# Patient Record
Sex: Female | Born: 1940 | Race: White | Hispanic: No | Smoking: Never smoker
Health system: Southern US, Community
[De-identification: ages and names within clinical notes are randomized; demographics above are authoritative.]

## PROBLEM LIST (undated history)

## (undated) DIAGNOSIS — C439 Malignant melanoma of skin, unspecified: Secondary | ICD-10-CM

## (undated) DIAGNOSIS — R519 Headache, unspecified: Secondary | ICD-10-CM

## (undated) DIAGNOSIS — D494 Neoplasm of unspecified behavior of bladder: Secondary | ICD-10-CM

## (undated) DIAGNOSIS — F419 Anxiety disorder, unspecified: Secondary | ICD-10-CM

## (undated) DIAGNOSIS — K219 Gastro-esophageal reflux disease without esophagitis: Secondary | ICD-10-CM

## (undated) DIAGNOSIS — M1990 Unspecified osteoarthritis, unspecified site: Secondary | ICD-10-CM

## (undated) DIAGNOSIS — C50919 Malignant neoplasm of unspecified site of unspecified female breast: Secondary | ICD-10-CM

## (undated) DIAGNOSIS — E785 Hyperlipidemia, unspecified: Secondary | ICD-10-CM

## (undated) DIAGNOSIS — R7303 Prediabetes: Secondary | ICD-10-CM

## (undated) DIAGNOSIS — R112 Nausea with vomiting, unspecified: Secondary | ICD-10-CM

## (undated) DIAGNOSIS — D649 Anemia, unspecified: Secondary | ICD-10-CM

## (undated) DIAGNOSIS — Z9889 Other specified postprocedural states: Secondary | ICD-10-CM

## (undated) DIAGNOSIS — I10 Essential (primary) hypertension: Secondary | ICD-10-CM

## (undated) HISTORY — DX: Hyperlipidemia, unspecified: E78.5

## (undated) HISTORY — PX: VAGINAL HYSTERECTOMY: SUR661

## (undated) HISTORY — PX: APPENDECTOMY: SHX54

## (undated) HISTORY — PX: CHOLECYSTECTOMY: SHX55

## (undated) HISTORY — DX: Essential (primary) hypertension: I10

## (undated) HISTORY — DX: Gastro-esophageal reflux disease without esophagitis: K21.9

---

## 1989-01-12 DIAGNOSIS — I219 Acute myocardial infarction, unspecified: Secondary | ICD-10-CM

## 1989-01-12 HISTORY — DX: Acute myocardial infarction, unspecified: I21.9

## 2004-08-04 ENCOUNTER — Ambulatory Visit: Payer: Self-pay | Admitting: Family Medicine

## 2004-08-13 ENCOUNTER — Ambulatory Visit: Payer: Self-pay | Admitting: General Surgery

## 2004-08-19 ENCOUNTER — Ambulatory Visit: Payer: Self-pay | Admitting: General Surgery

## 2005-08-06 ENCOUNTER — Ambulatory Visit: Payer: Self-pay | Admitting: Family Medicine

## 2005-10-09 ENCOUNTER — Ambulatory Visit: Payer: Self-pay | Admitting: Gastroenterology

## 2006-08-12 ENCOUNTER — Ambulatory Visit: Payer: Self-pay | Admitting: Family Medicine

## 2007-09-26 ENCOUNTER — Ambulatory Visit: Payer: Self-pay | Admitting: Family Medicine

## 2008-09-27 ENCOUNTER — Ambulatory Visit: Payer: Self-pay | Admitting: Family Medicine

## 2009-03-02 ENCOUNTER — Ambulatory Visit: Payer: Self-pay | Admitting: Internal Medicine

## 2009-10-28 ENCOUNTER — Ambulatory Visit: Payer: Self-pay | Admitting: Family Medicine

## 2010-10-30 ENCOUNTER — Ambulatory Visit: Payer: Self-pay | Admitting: Family Medicine

## 2011-11-03 ENCOUNTER — Ambulatory Visit: Payer: Self-pay | Admitting: Family Medicine

## 2011-11-10 ENCOUNTER — Ambulatory Visit: Payer: Self-pay | Admitting: Family Medicine

## 2012-01-13 HISTORY — PX: COLONOSCOPY: SHX174

## 2012-02-19 ENCOUNTER — Ambulatory Visit: Payer: Self-pay | Admitting: Gastroenterology

## 2012-06-02 ENCOUNTER — Ambulatory Visit: Payer: Self-pay | Admitting: Surgery

## 2012-07-12 ENCOUNTER — Ambulatory Visit: Payer: Self-pay | Admitting: Surgery

## 2012-07-12 DIAGNOSIS — I2510 Atherosclerotic heart disease of native coronary artery without angina pectoris: Secondary | ICD-10-CM

## 2012-07-12 LAB — BASIC METABOLIC PANEL
Anion Gap: 6 — ABNORMAL LOW (ref 7–16)
BUN: 14 mg/dL (ref 7–18)
Calcium, Total: 9.6 mg/dL (ref 8.5–10.1)
Chloride: 106 mmol/L (ref 98–107)
Co2: 28 mmol/L (ref 21–32)
EGFR (African American): >60
EGFR (Non-African Amer.): >60
Glucose: 101 mg/dL — ABNORMAL HIGH (ref 65–99)
Osmolality: 280 (ref 275–301)
Potassium: 4.6 mmol/L (ref 3.5–5.1)
Sodium: 140 mmol/L (ref 136–145)

## 2012-07-12 LAB — CBC WITH DIFFERENTIAL/PLATELET
Basophil #: 0 10*3/uL (ref 0.0–0.1)
Basophil %: 0.6 %
Eosinophil #: 0.1 10*3/uL (ref 0.0–0.7)
Eosinophil %: 1.9 %
HCT: 38.9 % (ref 35.0–47.0)
HGB: 13.8 g/dL (ref 12.0–16.0)
Lymphocyte #: 1.7 10*3/uL (ref 1.0–3.6)
MCHC: 35.5 g/dL (ref 32.0–36.0)
MCV: 85 fL (ref 80–100)
Monocyte %: 7.7 %
Neutrophil #: 5 10*3/uL (ref 1.4–6.5)
Platelet: 164 10*3/uL (ref 150–440)
RDW: 14.3 % (ref 11.5–14.5)
WBC: 7.5 10*3/uL (ref 3.6–11.0)

## 2012-07-19 ENCOUNTER — Ambulatory Visit: Payer: Self-pay | Admitting: Surgery

## 2012-08-12 ENCOUNTER — Ambulatory Visit: Payer: Self-pay | Admitting: Oncology

## 2012-08-12 DIAGNOSIS — C50919 Malignant neoplasm of unspecified site of unspecified female breast: Secondary | ICD-10-CM

## 2012-08-12 HISTORY — PX: MASTECTOMY: SHX3

## 2012-08-12 HISTORY — DX: Malignant neoplasm of unspecified site of unspecified female breast: C50.919

## 2012-08-15 ENCOUNTER — Ambulatory Visit: Payer: Self-pay | Admitting: Surgery

## 2012-08-15 LAB — CBC WITH DIFFERENTIAL/PLATELET
Basophil #: 0 10*3/uL (ref 0.0–0.1)
Basophil %: 0.5 %
HCT: 36.8 % (ref 35.0–47.0)
Lymphocyte #: 1.6 10*3/uL (ref 1.0–3.6)
MCV: 85 fL (ref 80–100)
Monocyte #: 0.5 x10 3/mm (ref 0.2–0.9)
Neutrophil #: 4.8 10*3/uL (ref 1.4–6.5)
Platelet: 164 10*3/uL (ref 150–440)
RBC: 4.35 10*6/uL (ref 3.80–5.20)
RDW: 14.1 % (ref 11.5–14.5)
WBC: 7.2 10*3/uL (ref 3.6–11.0)

## 2012-08-15 LAB — COMPREHENSIVE METABOLIC PANEL
Albumin: 3.6 g/dL (ref 3.4–5.0)
Alkaline Phosphatase: 85 U/L (ref 50–136)
BUN: 12 mg/dL (ref 7–18)
Bilirubin,Total: 0.7 mg/dL (ref 0.2–1.0)
Calcium, Total: 9.2 mg/dL (ref 8.5–10.1)
Chloride: 107 mmol/L (ref 98–107)
Co2: 28 mmol/L (ref 21–32)
Creatinine: 0.61 mg/dL (ref 0.60–1.30)
EGFR (Non-African Amer.): >60
Glucose: 105 mg/dL — ABNORMAL HIGH (ref 65–99)
Osmolality: 278 (ref 275–301)
SGOT(AST): 23 U/L (ref 15–37)
Total Protein: 7.3 g/dL (ref 6.4–8.2)

## 2012-08-15 LAB — APTT: Activated PTT: 36 secs — ABNORMAL HIGH (ref 23.6–35.9)

## 2012-08-15 LAB — PROTIME-INR
INR: 1
Prothrombin Time: 12.9 secs (ref 11.5–14.7)

## 2012-08-22 ENCOUNTER — Ambulatory Visit: Payer: Self-pay | Admitting: Surgery

## 2012-08-24 LAB — PATHOLOGY REPORT

## 2012-08-26 ENCOUNTER — Ambulatory Visit: Payer: Self-pay | Admitting: Family Medicine

## 2012-09-12 ENCOUNTER — Ambulatory Visit: Payer: Self-pay | Admitting: Oncology

## 2013-04-13 ENCOUNTER — Ambulatory Visit: Payer: Self-pay | Admitting: Oncology

## 2013-04-14 LAB — CBC CANCER CENTER
Basophil #: 0.1 x10 3/mm (ref 0.0–0.1)
Basophil %: 0.8 %
Eosinophil #: 0.1 x10 3/mm (ref 0.0–0.7)
Eosinophil %: 2.2 %
HCT: 39.8 % (ref 35.0–47.0)
HGB: 13.5 g/dL (ref 12.0–16.0)
Lymphocyte #: 1.8 x10 3/mm (ref 1.0–3.6)
Lymphocyte %: 26.2 %
MCH: 29.2 pg (ref 26.0–34.0)
MCHC: 33.8 g/dL (ref 32.0–36.0)
MCV: 86 fL (ref 80–100)
MONO ABS: 0.6 x10 3/mm (ref 0.2–0.9)
MONOS PCT: 9.2 %
NEUTROS ABS: 4.1 x10 3/mm (ref 1.4–6.5)
Neutrophil %: 61.6 %
Platelet: 157 x10 3/mm (ref 150–440)
RBC: 4.62 10*6/uL (ref 3.80–5.20)
RDW: 13.8 % (ref 11.5–14.5)
WBC: 6.7 x10 3/mm (ref 3.6–11.0)

## 2013-04-14 LAB — COMPREHENSIVE METABOLIC PANEL
ALBUMIN: 3.7 g/dL (ref 3.4–5.0)
Alkaline Phosphatase: 87 U/L
Anion Gap: 6 — ABNORMAL LOW (ref 7–16)
BUN: 14 mg/dL (ref 7–18)
Bilirubin,Total: 0.7 mg/dL (ref 0.2–1.0)
CO2: 30 mmol/L (ref 21–32)
Calcium, Total: 9 mg/dL (ref 8.5–10.1)
Chloride: 104 mmol/L (ref 98–107)
Creatinine: 0.8 mg/dL (ref 0.60–1.30)
EGFR (African American): >60
EGFR (Non-African Amer.): >60
Glucose: 92 mg/dL (ref 65–99)
Osmolality: 280 (ref 275–301)
POTASSIUM: 3.8 mmol/L (ref 3.5–5.1)
SGOT(AST): 21 U/L (ref 15–37)
SGPT (ALT): 20 U/L (ref 12–78)
Sodium: 140 mmol/L (ref 136–145)
Total Protein: 7.5 g/dL (ref 6.4–8.2)

## 2013-05-12 ENCOUNTER — Ambulatory Visit: Payer: Self-pay | Admitting: Oncology

## 2013-06-16 ENCOUNTER — Ambulatory Visit: Payer: Self-pay | Admitting: Oncology

## 2014-05-04 NOTE — Op Note (Signed)
PATIENT NAME:  Amber Galloway, Amber Galloway MR#:  [id number] DATE OF BIRTH:  01-Mar-1940  DATE OF PROCEDURE:  08/22/2012  PREOPERATIVE DIAGNOSIS: Right breast cancer.   POSTOPERATIVE DIAGNOSIS: Right breast cancer.   SURGEON: Consuela Mimes, M.D.   ANESTHESIA: General.   OPERATION PERFORMED: Right modified radical mastectomy.   PROCEDURE IN DETAIL: The patient was placed supine on the operating room table and prepped and draped in the usual sterile fashion. An elliptical incision was made and oriented transversely to include the entire nipple areolar complex with 1 apex of the ellipse near the lateral sternal border and the other apex at the lateral aspect of the breast. This incision was carried down through the skin subcutaneous tissue and subcutaneous skin flaps were created in all 4 directions. The superior border was the clavicle, the medial border was the lateral edge of the sternum, the inferior border was the inframammary fold and the lateral border was the free edge of the latissimus dorsi muscle. The breast was removed from the pectoralis major muscle to include the pectoralis major muscle fascia and perforating vessels from the internal mammary artery where it ligated with 2-0 silk suture ligatures and the Harmonic scalpel. As the breast tissue was completely removed and left attached to the patient by the axillary fat pad, care was taken to include all of the interpectoral fat and the breast tissue overlying the serratus anterior muscle and then the dissection was continued superiorly. Although the free edge of the latissimus dorsi muscle was not exposed, all of the axillary lymph node packet was excised in continuity with the breast up to the edge of the axillary vein. The axillary vein and lymphatic tissue was not disturbed and then the dissection was continued in a medial direction such that a 3-0 silk ligature was placed on a branch from the axillary vein and artery, but the thoracodorsal nerve  artery and vein were not disturbed.   Similarly, the long thoracic nerve was not disturbed. Similarly, the medial pectoral nerve was identified and was not disturbed. About two thirds of the level 1 lymph nodes (which were not clinically enlarged) were removed with the breast tissue and a suture mark was placed on the highest level 1 lymph node that had been excised. Hemostasis was deemed excellent and the wound was irrigated with warm normal saline and 2 large flat Jackson-Pratt drains were placed beneath the skin flaps and interrupted 3-0 Monocryl subdermal closure was performed and the skin was reapproximated with a running subcuticular 4-0 Monocryl and suture strips. A compressive dressing was applied and the Jackson-Pratt drains were secured to the skin with 2-0 nylon sutures. The patient tolerated the procedure well. There were no complications.   ____________________________ Consuela Mimes, MD wfm:aw D: 08/22/2012 10:07:59 ET T: 08/22/2012 11:00:58 ET JOB#: [id number]  cc: Consuela Mimes, MD, <Dictator> Juline Patch, MD DR. Gwynneth Albright Consuela Mimes MD ELECTRONICALLY SIGNED 08/22/2012 19:05

## 2014-05-04 NOTE — Op Note (Signed)
PATIENT NAME:  Amber Galloway, Amber Galloway MR#:  [id number] DATE OF BIRTH:  Oct 23, 1940  DATE OF PROCEDURE:  07/19/2012  PREOPERATIVE DIAGNOSIS: Right subareolar breast mass.   POSTOPERATIVE DIAGNOSIS: Right subareolar breast mass.   OPERATION PERFORMED: Right subareolar partial mastectomy.   ANESTHESIA: General.   SURGEON: Consuela Mimes, MD  PROCEDURE IN DETAIL: The patient was placed supine on the operating room table and prepped and draped in the usual sterile fashion. A curvilinear incision was made at the edge of the areolar complex in the inferior aspect of the right breast such that approximately 120 degrees or one-third of the circumference was incised. This was carried down through the skin and into the subcutaneous tissue, and then the mass lesion as well as most of the ductal tissue for the right breast was excised along with some breast tissue and surrounding normal subcutaneous tissue and breast tissue. This was done with the electrocautery. Hemostasis was achieved with the electrocautery. The wound was irrigated with warm normal saline, and this was suctioned clear. The skin was closed with an interrupted subdermal 3-0 Monocryl and then a running subcuticular 5-0 Monocryl with suture strips applied. A compressive dressing was applied following this. The specimen was marked for pathology and sent for permanent section. The patient tolerated the procedure well, and there were no complications.   ____________________________ Consuela Mimes, MD wfm:OSi D: 07/19/2012 11:35:53 ET T: 07/19/2012 11:55:31 ET JOB#: [id number]  cc: Consuela Mimes, MD, <Dictator> Juline Patch, MD Consuela Mimes MD ELECTRONICALLY SIGNED 07/21/2012 7:33

## 2014-05-21 DIAGNOSIS — Z86006 Personal history of melanoma in-situ: Secondary | ICD-10-CM | POA: Insufficient documentation

## 2014-05-24 ENCOUNTER — Other Ambulatory Visit: Payer: Self-pay | Admitting: Family Medicine

## 2014-05-24 DIAGNOSIS — Z1231 Encounter for screening mammogram for malignant neoplasm of breast: Secondary | ICD-10-CM

## 2014-06-19 ENCOUNTER — Ambulatory Visit
Admission: RE | Admit: 2014-06-19 | Discharge: 2014-06-19 | Disposition: A | Discharge status: Home / Self Care | Payer: Medicare Other | Source: Ambulatory Visit | Attending: Family Medicine | Admitting: Family Medicine

## 2014-06-19 DIAGNOSIS — Z1231 Encounter for screening mammogram for malignant neoplasm of breast: Secondary | ICD-10-CM | POA: Diagnosis not present

## 2014-06-19 HISTORY — DX: Malignant neoplasm of unspecified site of unspecified female breast: C50.919

## 2014-06-19 HISTORY — DX: Malignant melanoma of skin, unspecified: C43.9

## 2014-07-20 ENCOUNTER — Other Ambulatory Visit: Payer: Self-pay

## 2014-07-20 DIAGNOSIS — F419 Anxiety disorder, unspecified: Secondary | ICD-10-CM

## 2014-07-20 MED ORDER — ALPRAZOLAM 0.25 MG PO TABS: 0.2500 mg | ORAL_TABLET | ORAL | Status: DC

## 2014-09-05 ENCOUNTER — Other Ambulatory Visit: Payer: Self-pay

## 2014-11-05 ENCOUNTER — Ambulatory Visit (INDEPENDENT_AMBULATORY_CARE_PROVIDER_SITE_OTHER): Payer: Medicare Other | Admitting: Family Medicine

## 2014-11-05 ENCOUNTER — Encounter: Payer: Self-pay | Admitting: Family Medicine

## 2014-11-05 VITALS — BP 120/60 | HR 68 | Ht 65.0 in | Wt 145.0 lb

## 2014-11-05 DIAGNOSIS — Z23 Encounter for immunization: Secondary | ICD-10-CM | POA: Diagnosis not present

## 2014-11-05 DIAGNOSIS — Z0000 Encounter for general adult medical examination without abnormal findings: Secondary | ICD-10-CM | POA: Diagnosis not present

## 2014-11-05 NOTE — Progress Notes (Signed)
Name: Amber Galloway   MRN: [id number]    DOB: 1940-09-12   Date:11/05/2014       Progress Note  Subjective  Chief Complaint  Chief Complaint  Patient presents with  . Annual Exam    no pap    HPI Comments: Patient presents for physical exam with no subjective/objective concerns.   No problem-specific assessment & plan notes found for this encounter.   Past Medical History  Diagnosis Date  . Breast cancer ([place]) 08/2012    rt mastectomy  . Melanoma ([place])   . Hypertension   . Hyperlipidemia   . GERD (gastroesophageal reflux disease)     Past Surgical History  Procedure Laterality Date  . Mastectomy Right 08/2012  . Vaginal hysterectomy    . Appendectomy    . Cholecystectomy    . Colonoscopy  2014    cleared    Family History  Problem Relation Age of Onset  . Breast cancer Sister   . Breast cancer Maternal Aunt     Social History   Social History  . Marital Status: Widowed    Spouse Name: N/A  . Number of Children: N/A  . Years of Education: N/A   Occupational History  . Not on file.   Social History Main Topics  . Smoking status: Never Smoker   . Smokeless tobacco: Not on file  . Alcohol Use: No  . Drug Use: No  . Sexual Activity: No   Other Topics Concern  . Not on file   Social History Narrative    No Known Allergies   Review of Systems  Constitutional: Negative for fever, chills, weight loss and malaise/fatigue.  HENT: Negative for ear discharge, ear pain and sore throat.   Eyes: Negative for blurred vision.  Respiratory: Negative for cough, sputum production, shortness of breath and wheezing.   Cardiovascular: Negative for chest pain, palpitations and leg swelling.  Gastrointestinal: Negative for heartburn, nausea, abdominal pain, diarrhea, constipation, blood in stool and melena.  Genitourinary: Negative for dysuria, urgency, frequency and hematuria.  Musculoskeletal: Negative for myalgias, back pain, joint pain and neck pain.  Skin:  Negative for rash.  Neurological: Negative for dizziness, tingling, sensory change, focal weakness and headaches.  Endo/Heme/Allergies: Negative for environmental allergies and polydipsia. Does not bruise/bleed easily.  Psychiatric/Behavioral: Negative for depression and suicidal ideas. The patient is not nervous/anxious and does not have insomnia.      Objective  Filed Vitals:   11/05/14 0831  BP: 120/60  Pulse: 68  Height: 5\' 5"  (1.651 m)  Weight: 145 lb (65.772 kg)    Physical Exam  Constitutional: She is oriented to person, place, and time and well-developed, well-nourished, and in no distress. No distress.  HENT:  Head: Normocephalic and atraumatic.  Right Ear: External ear normal.  Left Ear: External ear normal.  Nose: Nose normal.  Mouth/Throat: Oropharynx is clear and moist.  Eyes: Conjunctivae and EOM are normal. Pupils are equal, round, and reactive to light. Right eye exhibits no discharge. Left eye exhibits no discharge.  Neck: Normal range of motion. Neck supple. No JVD present. No thyromegaly present.  Cardiovascular: Normal rate, regular rhythm, normal heart sounds and intact distal pulses.  Exam reveals no gallop and no friction rub.   No murmur heard. Pulmonary/Chest: Effort normal and breath sounds normal. Left breast exhibits no inverted nipple, no mass, no nipple discharge, no skin change and no tenderness.  mastectomy left  Abdominal: Soft. Bowel sounds are normal. She exhibits no mass.  There is no hepatosplenomegaly. There is no tenderness. There is no rebound and no guarding.  Musculoskeletal: Normal range of motion. She exhibits no edema.  Lymphadenopathy:    She has no cervical adenopathy.  Neurological: She is alert and oriented to person, place, and time. She has normal motor skills, normal sensation, normal strength, normal reflexes and intact cranial nerves.  Skin: Skin is warm and dry. She is not diaphoretic.  Psychiatric: Mood, memory and affect  normal.  Nursing note and vitals reviewed.     Assessment & Plan  Problem List Items Addressed This Visit    None    Visit Diagnoses    Annual physical exam    -  Primary    Relevant Orders    Lipid Profile    Renal Function Panel    Need for influenza vaccination        Relevant Orders    Flu Vaccine QUAD 36+ mos PF IM (Fluarix & Fluzone Quad PF) (Completed)    Need for pneumococcal vaccination        Relevant Orders    Pneumococcal conjugate vaccine 13-valent (Completed)         Dr. Deanna Jones [facility]  11/05/2014

## 2014-11-06 LAB — RENAL FUNCTION PANEL
Albumin: 4.8 g/dL (ref 3.5–4.8)
BUN/Creatinine Ratio: 17 (ref 11–26)
BUN: 11 mg/dL (ref 8–27)
CO2: 27 mmol/L (ref 18–29)
CREATININE: 0.66 mg/dL (ref 0.57–1.00)
Calcium: 10 mg/dL (ref 8.7–10.3)
Chloride: 97 mmol/L (ref 97–106)
GFR calc Af Amer: 99 mL/min/{1.73_m2} (ref >59)
GFR calc non Af Amer: 86 mL/min/{1.73_m2} (ref >59)
Glucose: 123 mg/dL — ABNORMAL HIGH (ref 65–99)
PHOSPHORUS: 3 mg/dL (ref 2.5–4.5)
Potassium: 3.6 mmol/L (ref 3.5–5.2)
Sodium: 143 mmol/L (ref 136–144)

## 2014-11-06 LAB — LIPID PANEL
Chol/HDL Ratio: 4.5 ratio units — ABNORMAL HIGH (ref 0.0–4.4)
Cholesterol, Total: 224 mg/dL — ABNORMAL HIGH (ref 100–199)
HDL: 50 mg/dL (ref >39)
LDL Calculated: 141 mg/dL — ABNORMAL HIGH (ref 0–99)
TRIGLYCERIDES: 163 mg/dL — AB (ref 0–149)
VLDL Cholesterol Cal: 33 mg/dL (ref 5–40)

## 2014-11-27 ENCOUNTER — Ambulatory Visit (INDEPENDENT_AMBULATORY_CARE_PROVIDER_SITE_OTHER): Payer: Medicare Other | Admitting: Family Medicine

## 2014-11-27 ENCOUNTER — Encounter: Payer: Self-pay | Admitting: Family Medicine

## 2014-11-27 VITALS — BP 120/70 | HR 60 | Temp 97.6°F | Ht 65.0 in | Wt 146.0 lb

## 2014-11-27 DIAGNOSIS — J0100 Acute maxillary sinusitis, unspecified: Secondary | ICD-10-CM | POA: Diagnosis not present

## 2014-11-27 MED ORDER — AMOXICILLIN 500 MG PO CAPS: 500.0000 mg | ORAL_CAPSULE | Freq: Three times a day (TID) | ORAL | Status: DC

## 2014-11-27 NOTE — Progress Notes (Signed)
Name: Amber Galloway   MRN: [id number]    DOB: April 05, 1940   Date:11/27/2014       Progress Note  Subjective  Chief Complaint  Chief Complaint  Patient presents with  . Cough    no production- tried otc with no relief    Cough This is a new problem. The current episode started yesterday. The problem has been gradually worsening. The problem occurs every few minutes. The cough is non-productive. Associated symptoms include ear pain, a fever, headaches, nasal congestion, postnasal drip and a sore throat. Pertinent negatives include no chest pain, chills, ear congestion, heartburn, hemoptysis, myalgias, rash, rhinorrhea, shortness of breath, weight loss or wheezing. The symptoms are aggravated by cold air. She has tried nothing for the symptoms. The treatment provided mild relief. There is no history of asthma, bronchiectasis, bronchitis, COPD, emphysema, environmental allergies or pneumonia.    No problem-specific assessment & plan notes found for this encounter.   Past Medical History  Diagnosis Date  . Breast cancer ([place]) 08/2012    rt mastectomy  . Melanoma ([place])   . Hypertension   . Hyperlipidemia   . GERD (gastroesophageal reflux disease)     Past Surgical History  Procedure Laterality Date  . Mastectomy Right 08/2012  . Vaginal hysterectomy    . Appendectomy    . Cholecystectomy    . Colonoscopy  2014    cleared    Family History  Problem Relation Age of Onset  . Breast cancer Sister   . Breast cancer Maternal Aunt     Social History   Social History  . Marital Status: Widowed    Spouse Name: N/A  . Number of Children: N/A  . Years of Education: N/A   Occupational History  . Not on file.   Social History Main Topics  . Smoking status: Never Smoker   . Smokeless tobacco: Not on file  . Alcohol Use: No  . Drug Use: No  . Sexual Activity: No   Other Topics Concern  . Not on file   Social History Narrative    No Known Allergies   Review of Systems   Constitutional: Positive for fever. Negative for chills, weight loss and malaise/fatigue.  HENT: Positive for ear pain, postnasal drip and sore throat. Negative for ear discharge and rhinorrhea.   Eyes: Negative for blurred vision.  Respiratory: Positive for cough. Negative for hemoptysis, sputum production, shortness of breath and wheezing.   Cardiovascular: Negative for chest pain, palpitations and leg swelling.  Gastrointestinal: Negative for heartburn, nausea, abdominal pain, diarrhea, constipation, blood in stool and melena.  Genitourinary: Negative for dysuria, urgency, frequency and hematuria.  Musculoskeletal: Negative for myalgias, back pain, joint pain and neck pain.  Skin: Negative for rash.  Neurological: Positive for headaches. Negative for dizziness, tingling, sensory change and focal weakness.  Endo/Heme/Allergies: Negative for environmental allergies and polydipsia. Does not bruise/bleed easily.  Psychiatric/Behavioral: Negative for depression and suicidal ideas. The patient is not nervous/anxious and does not have insomnia.      Objective  Filed Vitals:   11/27/14 1013  BP: 120/70  Pulse: 60  Temp: 97.6 F (36.4 C)  TempSrc: Oral  Height: 5\' 5"  (1.651 m)  Weight: 146 lb (66.225 kg)    Physical Exam  Constitutional: She is well-developed, well-nourished, and in no distress. No distress.  HENT:  Head: Normocephalic and atraumatic.  Right Ear: External ear normal.  Left Ear: External ear normal.  Nose: Nose normal.  Mouth/Throat: Oropharynx is clear  and moist.  Eyes: Conjunctivae and EOM are normal. Pupils are equal, round, and reactive to light. Right eye exhibits no discharge. Left eye exhibits no discharge.  Neck: Normal range of motion. Neck supple. No JVD present. No thyromegaly present.  Cardiovascular: Normal rate, regular rhythm, normal heart sounds and intact distal pulses.  Exam reveals no gallop and no friction rub.   No murmur  heard. Pulmonary/Chest: Effort normal and breath sounds normal.  Abdominal: Soft. Bowel sounds are normal. She exhibits no mass. There is no tenderness. There is no guarding.  Musculoskeletal: Normal range of motion. She exhibits no edema.  Lymphadenopathy:    She has no cervical adenopathy.  Neurological: She is alert. She has normal reflexes.  Skin: Skin is warm and dry. She is not diaphoretic.  Psychiatric: Mood and affect normal.      Assessment & Plan  Problem List Items Addressed This Visit    None    Visit Diagnoses    Acute maxillary sinusitis, recurrence not specified    -  Primary    Relevant Medications    amoxicillin (AMOXIL) 500 MG capsule         Dr. [facility]  11/27/2014

## 2014-12-12 ENCOUNTER — Other Ambulatory Visit: Payer: Self-pay

## 2014-12-12 DIAGNOSIS — B379 Candidiasis, unspecified: Secondary | ICD-10-CM

## 2014-12-12 MED ORDER — FLUCONAZOLE 150 MG PO TABS: 150.0000 mg | ORAL_TABLET | Freq: Once | ORAL | Status: DC

## 2014-12-19 ENCOUNTER — Other Ambulatory Visit: Payer: Self-pay | Admitting: Family Medicine

## 2015-01-03 ENCOUNTER — Other Ambulatory Visit: Payer: Self-pay | Admitting: Family Medicine

## 2015-01-18 ENCOUNTER — Other Ambulatory Visit: Payer: Self-pay

## 2015-01-18 DIAGNOSIS — E785 Hyperlipidemia, unspecified: Secondary | ICD-10-CM

## 2015-01-18 DIAGNOSIS — I10 Essential (primary) hypertension: Secondary | ICD-10-CM

## 2015-01-18 DIAGNOSIS — K219 Gastro-esophageal reflux disease without esophagitis: Secondary | ICD-10-CM

## 2015-01-18 MED ORDER — HYDROCHLOROTHIAZIDE 12.5 MG PO CAPS: ORAL_CAPSULE | ORAL | Status: DC

## 2015-01-18 MED ORDER — METOPROLOL TARTRATE 100 MG PO TABS: 100.0000 mg | ORAL_TABLET | Freq: Two times a day (BID) | ORAL | Status: DC

## 2015-01-18 MED ORDER — SIMVASTATIN 80 MG PO TABS: 80.0000 mg | ORAL_TABLET | Freq: Every day | ORAL | Status: DC

## 2015-01-18 MED ORDER — OMEPRAZOLE 20 MG PO CPDR: 20.0000 mg | DELAYED_RELEASE_CAPSULE | Freq: Every day | ORAL | Status: DC

## 2015-01-18 MED ORDER — GEMFIBROZIL 600 MG PO TABS: 600.0000 mg | ORAL_TABLET | Freq: Two times a day (BID) | ORAL | Status: DC

## 2015-02-15 ENCOUNTER — Other Ambulatory Visit: Payer: Self-pay | Admitting: Family Medicine

## 2015-02-19 ENCOUNTER — Encounter: Payer: Self-pay | Admitting: Family Medicine

## 2015-02-19 ENCOUNTER — Ambulatory Visit (INDEPENDENT_AMBULATORY_CARE_PROVIDER_SITE_OTHER): Payer: Medicare HMO | Admitting: Family Medicine

## 2015-02-19 VITALS — BP 120/60 | HR 84 | Ht 65.0 in | Wt 150.0 lb

## 2015-02-19 DIAGNOSIS — G5621 Lesion of ulnar nerve, right upper limb: Secondary | ICD-10-CM

## 2015-02-19 MED ORDER — PREDNISONE 10 MG PO TABS: 10.0000 mg | ORAL_TABLET | Freq: Every day | ORAL | Status: DC

## 2015-02-19 NOTE — Progress Notes (Signed)
Name: Amber Galloway   MRN: [id number]    DOB: March 18, 1940   Date:02/19/2015       Progress Note  Subjective  Chief Complaint  Chief Complaint  Patient presents with  . Hand Pain    hand feels like it is "sore, not really painful" Started x 2 weeks ago in her pinky finger and has spread across the rest of her fingers    Hand Pain  The incident occurred 5 to 7 days ago. The injury mechanism was a direct blow. The pain is present in the right hand. The quality of the pain is described as burning. The pain is at a severity of 3/10. The pain is mild. The pain has been fluctuating since the incident. Associated symptoms include numbness and tingling. Pertinent negatives include no chest pain or muscle weakness. She has tried nothing for the symptoms. The treatment provided no relief.    No problem-specific assessment & plan notes found for this encounter.   Past Medical History  Diagnosis Date  . Breast cancer ([place]) 08/2012    rt mastectomy  . Melanoma ([place])   . Hypertension   . Hyperlipidemia   . GERD (gastroesophageal reflux disease)     Past Surgical History  Procedure Laterality Date  . Mastectomy Right 08/2012  . Vaginal hysterectomy    . Appendectomy    . Cholecystectomy    . Colonoscopy  2014    cleared    Family History  Problem Relation Age of Onset  . Breast cancer Sister   . Breast cancer Maternal Aunt     Social History   Social History  . Marital Status: Widowed    Spouse Name: N/A  . Number of Children: N/A  . Years of Education: N/A   Occupational History  . Not on file.   Social History Main Topics  . Smoking status: Never Smoker   . Smokeless tobacco: Not on file  . Alcohol Use: No  . Drug Use: No  . Sexual Activity: No   Other Topics Concern  . Not on file   Social History Narrative    No Known Allergies   Review of Systems  Constitutional: Negative for fever, chills, weight loss and malaise/fatigue.  HENT: Negative for ear discharge,  ear pain and sore throat.   Eyes: Negative for blurred vision.  Respiratory: Negative for cough, sputum production, shortness of breath and wheezing.   Cardiovascular: Negative for chest pain, palpitations and leg swelling.  Gastrointestinal: Negative for heartburn, nausea, abdominal pain, diarrhea, constipation, blood in stool and melena.  Genitourinary: Negative for dysuria, urgency, frequency and hematuria.  Musculoskeletal: Negative for myalgias, back pain, joint pain and neck pain.  Skin: Negative for rash.  Neurological: Positive for tingling and numbness. Negative for dizziness, sensory change, focal weakness and headaches.  Endo/Heme/Allergies: Negative for environmental allergies and polydipsia. Does not bruise/bleed easily.  Psychiatric/Behavioral: Negative for depression and suicidal ideas. The patient is not nervous/anxious and does not have insomnia.      Objective  Filed Vitals:   02/19/15 1616  BP: 120/60  Pulse: 84  Height: 5\' 5"  (1.651 m)  Weight: 150 lb (68.04 kg)    Physical Exam  Constitutional: She is well-developed, well-nourished, and in no distress. No distress.  HENT:  Head: Normocephalic and atraumatic.  Right Ear: External ear normal.  Left Ear: External ear normal.  Nose: Nose normal.  Mouth/Throat: Oropharynx is clear and moist.  Eyes: Conjunctivae and EOM are normal. Pupils are equal,  round, and reactive to light. Right eye exhibits no discharge. Left eye exhibits no discharge.  Neck: Normal range of motion. Neck supple. No JVD present. No thyromegaly present.  Cardiovascular: Normal rate, regular rhythm, normal heart sounds and intact distal pulses.  Exam reveals no gallop and no friction rub.   No murmur heard. Pulmonary/Chest: Effort normal and breath sounds normal.  Abdominal: Soft. Bowel sounds are normal. She exhibits no mass. There is no tenderness. There is no guarding.  Musculoskeletal: Normal range of motion. She exhibits no edema.   Lymphadenopathy:    She has no cervical adenopathy.  Neurological: She is alert. She has normal sensation, normal strength and normal reflexes.  Skin: Skin is warm and dry. She is not diaphoretic.  Psychiatric: Mood and affect normal.      Assessment & Plan  Problem List Items Addressed This Visit    None    Visit Diagnoses    Ulnar neuritis, right    -  Primary    advil daily    Relevant Medications    predniSONE (DELTASONE) 10 MG tablet         Dr. Deanna Jones [facility]  02/19/2015

## 2015-03-05 DIAGNOSIS — H40023 Open angle with borderline findings, high risk, bilateral: Secondary | ICD-10-CM | POA: Diagnosis not present

## 2015-03-09 ENCOUNTER — Encounter: Payer: Self-pay | Admitting: *Deleted

## 2015-03-09 ENCOUNTER — Ambulatory Visit
Admission: EM | Admit: 2015-03-09 | Discharge: 2015-03-09 | Disposition: A | Discharge status: Home / Self Care | Payer: Medicare HMO | Attending: Family Medicine | Admitting: Family Medicine

## 2015-03-09 DIAGNOSIS — J069 Acute upper respiratory infection, unspecified: Secondary | ICD-10-CM

## 2015-03-09 DIAGNOSIS — B9789 Other viral agents as the cause of diseases classified elsewhere: Principal | ICD-10-CM

## 2015-03-09 MED ORDER — BENZONATATE 200 MG PO CAPS: 200.0000 mg | ORAL_CAPSULE | Freq: Three times a day (TID) | ORAL | Status: DC | PRN

## 2015-03-09 NOTE — ED Notes (Signed)
Non-productive cough and runny nose x2 days.

## 2015-03-09 NOTE — ED Provider Notes (Signed)
CSN: [id number]     Arrival date & time 03/09/15  0830 History   First MD Initiated Contact with Patient 03/09/15 1000     Chief Complaint  Patient presents with  . Cough  . Nasal Congestion   (Consider location/radiation/quality/duration/timing/severity/associated sxs/prior Treatment) Patient is a 75 y.o. female presenting with URI. The history is provided by the patient.  URI Presenting symptoms: congestion, cough and rhinorrhea   Presenting symptoms: no ear pain, no facial pain, no fever and no sore throat   Severity:  Moderate Onset quality:  Sudden Duration:  2 days Timing:  Constant Progression:  Unchanged Chronicity:  New Relieved by:  None tried Worsened by:  Nothing tried Ineffective treatments:  None tried Associated symptoms: sneezing   Associated symptoms: no arthralgias, no headaches, no myalgias, no neck pain, no sinus pain, no swollen glands and no wheezing   Risk factors: being elderly   Risk factors: no chronic kidney disease, no chronic respiratory disease, no diabetes mellitus, no immunosuppression, no recent illness and no recent travel     Past Medical History  Diagnosis Date  . Breast cancer ([place]) 08/2012    rt mastectomy  . Melanoma ([place])   . Hypertension   . Hyperlipidemia   . GERD (gastroesophageal reflux disease)    Past Surgical History  Procedure Laterality Date  . Mastectomy Right 08/2012  . Vaginal hysterectomy    . Appendectomy    . Cholecystectomy    . Colonoscopy  2014    cleared   Family History  Problem Relation Age of Onset  . Breast cancer Sister   . Breast cancer Maternal Aunt    Social History  Substance Use Topics  . Smoking status: Never Smoker   . Smokeless tobacco: None  . Alcohol Use: No   OB History    No data available     Review of Systems  Constitutional: Negative for fever.  HENT: Positive for congestion, rhinorrhea and sneezing. Negative for ear pain and sore throat.   Respiratory: Positive for cough.  Negative for wheezing.   Musculoskeletal: Negative for myalgias, arthralgias and neck pain.  Neurological: Negative for headaches.    Allergies  Review of patient's allergies indicates no known allergies.  Home Medications   Prior to Admission medications   Medication Sig Start Date End Date Taking? Authorizing Provider  ALPRAZolam (XANAX) 0.25 MG tablet Take 1 tablet (0.25 mg total) by mouth 1 day or 1 dose. 07/20/14  Yes Juline Patch, MD  aspirin 325 MG tablet Take 325 mg by mouth daily.   Yes Historical Provider, MD  gemfibrozil (LOPID) 600 MG tablet TAKE 1 TABLET(600 MG) BY MOUTH TWICE DAILY 02/18/15  Yes Juline Patch, MD  hydrochlorothiazide (MICROZIDE) 12.5 MG capsule TAKE 1 CAPSULE BY MOUTH EVERY DAY 01/18/15  Yes Juline Patch, MD  latanoprost (XALATAN) 0.005 % ophthalmic solution Place 1 drop into both eyes at bedtime. 10/18/14  Yes Historical Provider, MD  metoprolol (LOPRESSOR) 100 MG tablet Take 1 tablet (100 mg total) by mouth 2 (two) times daily. 01/18/15  Yes Juline Patch, MD  omeprazole (PRILOSEC) 20 MG capsule Take 1 capsule (20 mg total) by mouth daily. 01/18/15  Yes Juline Patch, MD  simvastatin (ZOCOR) 80 MG tablet Take 1 tablet (80 mg total) by mouth daily. 01/18/15  Yes Juline Patch, MD  benzonatate (TESSALON) 200 MG capsule Take 1 capsule (200 mg total) by mouth 3 (three) times daily as needed for cough. 03/09/15  Norval Gable, MD  predniSONE (DELTASONE) 10 MG tablet Take 1 tablet (10 mg total) by mouth daily with breakfast. 2 pills a day for four days then one pill a day for four days 02/19/15   Juline Patch, MD   Meds Ordered and Administered this Visit  Medications - No data to display  BP 152/62 mmHg  Pulse 71  Temp(Src) 98.5 F (36.9 C) (Oral)  Resp 16  Ht 5\' 5"  (1.651 m)  Wt 143 lb (64.864 kg)  BMI 23.80 kg/m2  SpO2 99% No data found.   Physical Exam  Constitutional: She appears well-developed and well-nourished. No distress.  HENT:  Head:  Normocephalic and atraumatic.  Right Ear: Tympanic membrane, external ear and ear canal normal.  Left Ear: Tympanic membrane, external ear and ear canal normal.  Nose: Rhinorrhea present. No nose lacerations, sinus tenderness, nasal deformity, septal deviation or nasal septal hematoma. No epistaxis.  No foreign bodies. Right sinus exhibits no maxillary sinus tenderness and no frontal sinus tenderness. Left sinus exhibits no maxillary sinus tenderness and no frontal sinus tenderness.  Mouth/Throat: Uvula is midline, oropharynx is clear and moist and mucous membranes are normal. No oropharyngeal exudate.  Eyes: Conjunctivae and EOM are normal. Pupils are equal, round, and reactive to light. Right eye exhibits no discharge. Left eye exhibits no discharge. No scleral icterus.  Neck: Normal range of motion. Neck supple. No thyromegaly present.  Cardiovascular: Normal rate, regular rhythm and normal heart sounds.   Pulmonary/Chest: Effort normal and breath sounds normal. No respiratory distress. She has no wheezes. She has no rales.  Lymphadenopathy:    She has no cervical adenopathy.  Skin: She is not diaphoretic.  Nursing note and vitals reviewed.   ED Course  Procedures (including critical care time)  Labs Review Labs Reviewed - No data to display  Imaging Review No results found.   Visual Acuity Review  Right Eye Distance:   Left Eye Distance:   Bilateral Distance:    Right Eye Near:   Left Eye Near:    Bilateral Near:         MDM   1. Viral URI with cough    New Prescriptions   BENZONATATE (TESSALON) 200 MG CAPSULE    Take 1 capsule (200 mg total) by mouth 3 (three) times daily as needed for cough.   1. diagnosis reviewed with patient 2. rx as per orders above; reviewed possible side effects, interactions, risks and benefits  3. Recommend supportive treatment with increased fluids 4. Follow-up prn if symptoms worsen or don't improve    Norval Gable, MD 03/09/15  1011

## 2015-03-17 ENCOUNTER — Other Ambulatory Visit: Payer: Self-pay | Admitting: Family Medicine

## 2015-04-23 ENCOUNTER — Other Ambulatory Visit: Payer: Self-pay | Admitting: Family Medicine

## 2015-04-24 ENCOUNTER — Other Ambulatory Visit: Payer: Self-pay

## 2015-05-20 ENCOUNTER — Ambulatory Visit (INDEPENDENT_AMBULATORY_CARE_PROVIDER_SITE_OTHER): Payer: Medicare HMO

## 2015-05-20 ENCOUNTER — Ambulatory Visit
Admission: EM | Admit: 2015-05-20 | Discharge: 2015-05-20 | Disposition: A | Discharge status: Home / Self Care | Payer: Medicare HMO | Attending: Family Medicine | Admitting: Family Medicine

## 2015-05-20 DIAGNOSIS — R079 Chest pain, unspecified: Secondary | ICD-10-CM | POA: Diagnosis not present

## 2015-05-20 DIAGNOSIS — M940 Chondrocostal junction syndrome [Tietze]: Secondary | ICD-10-CM | POA: Diagnosis not present

## 2015-05-20 DIAGNOSIS — S39012A Strain of muscle, fascia and tendon of lower back, initial encounter: Secondary | ICD-10-CM | POA: Diagnosis not present

## 2015-05-20 DIAGNOSIS — M546 Pain in thoracic spine: Secondary | ICD-10-CM | POA: Diagnosis not present

## 2015-05-20 DIAGNOSIS — S299XXA Unspecified injury of thorax, initial encounter: Secondary | ICD-10-CM | POA: Diagnosis not present

## 2015-05-20 MED ORDER — DIAZEPAM 2 MG PO TABS: ORAL_TABLET | ORAL | Status: DC

## 2015-05-20 NOTE — Discharge Instructions (Signed)
Costochondritis Costochondritis, sometimes called Tietze syndrome, is a swelling and irritation (inflammation) of the tissue (cartilage) that connects your ribs with your breastbone (sternum). It causes pain in the chest and rib area. Costochondritis usually goes away on its own over time. It can take up to 6 weeks or longer to get better, especially if you are unable to limit your activities. CAUSES  Some cases of costochondritis have no known cause. Possible causes include:  Injury (trauma).  Exercise or activity such as lifting.  Severe coughing. SIGNS AND SYMPTOMS  Pain and tenderness in the chest and rib area.  Pain that gets worse when coughing or taking deep breaths.  Pain that gets worse with specific movements. DIAGNOSIS  Your health care provider will do a physical exam and ask about your symptoms. Chest X-rays or other tests may be done to rule out other problems. TREATMENT  Costochondritis usually goes away on its own over time. Your health care provider may prescribe medicine to help relieve pain. HOME CARE INSTRUCTIONS   Avoid exhausting physical activity. Try not to strain your ribs during normal activity. This would include any activities using chest, abdominal, and side muscles, especially if heavy weights are used.  Apply ice to the affected area for the first 2 days after the pain begins.  Put ice in a plastic bag.  Place a towel between your skin and the bag.  Leave the ice on for 20 minutes, 2-3 times a day.  Only take over-the-counter or prescription medicines as directed by your health care provider. SEEK MEDICAL CARE IF:  You have redness or swelling at the rib joints. These are signs of infection.  Your pain does not go away despite rest or medicine. SEEK IMMEDIATE MEDICAL CARE IF:   Your pain increases or you are very uncomfortable.  You have shortness of breath or difficulty breathing.  You cough up blood.  You have worse chest pains,  sweating, or vomiting.  You have a fever or persistent symptoms for more than 2-3 days.  You have a fever and your symptoms suddenly get worse. MAKE SURE YOU:   Understand these instructions.  Will watch your condition.  Will get help right away if you are not doing well or get worse.   This information is not intended to replace advice given to you by your health care provider. Make sure you discuss any questions you have with your health care provider.   Document Released: 10/08/2004 Document Revised: 10/19/2012 Document Reviewed: 08/02/2012 Elsevier Interactive Patient Education 2016 [place] Injury Prevention Back injuries can be very painful. They can also be difficult to heal. After having one back injury, you are more likely to injure your back again. It is important to learn how to avoid injuring or re-injuring your back. The following tips can help you to prevent a back injury. WHAT SHOULD I KNOW ABOUT PHYSICAL FITNESS?  Exercise for 30 minutes per day on most days of the week or as directed by your health care provider. Make sure to:  Do aerobic exercises, such as walking, jogging, biking, or swimming.  Do exercises that increase balance and strength, such as tai chi and yoga. These can decrease your risk of falling and injuring your back.  Do stretching exercises to help with flexibility.  Try to develop strong abdominal muscles. Your abdominal muscles provide a lot of the support that is needed by your back.  Maintain a healthy weight. This helps to decrease your risk of  a back injury. WHAT SHOULD I KNOW ABOUT MY DIET?  Talk with your health care provider about your overall diet. Take supplements and vitamins only as directed by your health care provider.  Talk with your health care provider about how much calcium and vitamin D you need each day. These nutrients help to prevent weakening of the bones (osteoporosis). Osteoporosis can cause broken  (fractured) bones, which lead to back pain.  Include good sources of calcium in your diet, such as dairy products, green leafy vegetables, and products that have had calcium added to them (fortified).  Include good sources of vitamin D in your diet, such as milk and foods that are fortified with vitamin D. WHAT SHOULD I KNOW ABOUT MY POSTURE?  Sit up straight and stand up straight. Avoid leaning forward when you sit or hunching over when you stand.  Choose chairs that have good low-back (lumbar) support.  If you work at a desk, sit close to it so you do not need to lean over. Keep your chin tucked in. Keep your neck drawn back, and keep your elbows bent at a right angle. Your arms should look like the letter "L."  Sit high and close to the steering wheel when you drive. Add a lumbar support to your car seat, if needed.  Avoid sitting or standing in one position for very long. Take breaks to get up, stretch, and walk around at least one time every hour. Take breaks every hour if you are driving for long periods of time.  Sleep on your side with your knees slightly bent, or sleep on your back with a pillow under your knees. Do not lie on the front of your body to sleep. WHAT SHOULD I KNOW ABOUT LIFTING, TWISTING, AND REACHING? Lifting and Heavy Lifting  Avoid heavy lifting, especially repetitive heavy lifting. If you must do heavy lifting:  Stretch before lifting.  Work slowly.  Rest between lifts.  Use a tool such as a cart or a dolly to move objects if one is available.  Make several small trips instead of carrying one heavy load.  Ask for help when you need it, especially when moving big objects.  Follow these steps when lifting:  Stand with your feet shoulder-width apart.  Get as close to the object as you can. Do not try to pick up a heavy object that is far from your body.  Use handles or lifting straps if they are available.  Bend at your knees. Squat down, but keep  your heels off the floor.  Keep your shoulders pulled back, your chin tucked in, and your back straight.  Lift the object slowly while you tighten the muscles in your legs, abdomen, and buttocks. Keep the object as close to the center of your body as possible.  Follow these steps when putting down a heavy load:  Stand with your feet shoulder-width apart.  Lower the object slowly while you tighten the muscles in your legs, abdomen, and buttocks. Keep the object as close to the center of your body as possible.  Keep your shoulders pulled back, your chin tucked in, and your back straight.  Bend at your knees. Squat down, but keep your heels off the floor.  Use handles or lifting straps if they are available. Twisting and Reaching  Avoid lifting heavy objects above your waist.  Do not twist at your waist while you are lifting or carrying a load. If you need to turn, move your feet.  Do not bend over without bending at your knees.  Avoid reaching over your head, across a table, or for an object on a high surface. WHAT ARE SOME OTHER TIPS?  Avoid wet floors and icy ground. Keep sidewalks clear of ice to prevent falls.  Do not sleep on a mattress that is too soft or too hard.  Keep items that are used frequently within easy reach.  Put heavier objects on shelves at waist level, and put lighter objects on lower or higher shelves.  Find ways to decrease your stress, such as exercise, massage, or relaxation techniques. Stress can build up in your muscles. Tense muscles are more vulnerable to injury.  Talk with your health care provider if you feel anxious or depressed. These conditions can make back pain worse.  Wear flat heel shoes with cushioned soles.  Avoid sudden movements.  Use both shoulder straps when carrying a backpack.  Do not use any tobacco products, including cigarettes, chewing tobacco, or electronic cigarettes. If you need help quitting, ask your health care  provider.   This information is not intended to replace advice given to you by your health care provider. Make sure you discuss any questions you have with your health care provider.   Document Released: 02/06/2004 Document Revised: 05/15/2014 Document Reviewed: 01/02/2014 Elsevier Interactive Patient Education [employer].
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
## 2015-05-29 DIAGNOSIS — Z85828 Personal history of other malignant neoplasm of skin: Secondary | ICD-10-CM | POA: Diagnosis not present

## 2015-05-29 DIAGNOSIS — L821 Other seborrheic keratosis: Secondary | ICD-10-CM | POA: Diagnosis not present

## 2015-05-29 DIAGNOSIS — Z872 Personal history of diseases of the skin and subcutaneous tissue: Secondary | ICD-10-CM | POA: Diagnosis not present

## 2015-05-29 DIAGNOSIS — D485 Neoplasm of uncertain behavior of skin: Secondary | ICD-10-CM | POA: Diagnosis not present

## 2015-05-29 DIAGNOSIS — L728 Other follicular cysts of the skin and subcutaneous tissue: Secondary | ICD-10-CM | POA: Diagnosis not present

## 2015-05-29 DIAGNOSIS — Z08 Encounter for follow-up examination after completed treatment for malignant neoplasm: Secondary | ICD-10-CM | POA: Diagnosis not present

## 2015-05-29 DIAGNOSIS — Z1283 Encounter for screening for malignant neoplasm of skin: Secondary | ICD-10-CM | POA: Diagnosis not present

## 2015-05-29 DIAGNOSIS — Z85820 Personal history of malignant melanoma of skin: Secondary | ICD-10-CM | POA: Diagnosis not present

## 2015-05-30 ENCOUNTER — Other Ambulatory Visit: Payer: Self-pay | Admitting: Family Medicine

## 2015-06-06 ENCOUNTER — Encounter: Payer: Self-pay | Admitting: Family Medicine

## 2015-06-06 ENCOUNTER — Ambulatory Visit (INDEPENDENT_AMBULATORY_CARE_PROVIDER_SITE_OTHER): Payer: Medicare HMO | Admitting: Family Medicine

## 2015-06-06 VITALS — BP 120/78 | HR 68 | Ht 65.0 in | Wt 152.0 lb

## 2015-06-06 DIAGNOSIS — R69 Illness, unspecified: Secondary | ICD-10-CM | POA: Diagnosis not present

## 2015-06-06 DIAGNOSIS — E782 Mixed hyperlipidemia: Secondary | ICD-10-CM | POA: Insufficient documentation

## 2015-06-06 DIAGNOSIS — S161XXD Strain of muscle, fascia and tendon at neck level, subsequent encounter: Secondary | ICD-10-CM

## 2015-06-06 DIAGNOSIS — E785 Hyperlipidemia, unspecified: Secondary | ICD-10-CM | POA: Diagnosis not present

## 2015-06-06 DIAGNOSIS — F419 Anxiety disorder, unspecified: Secondary | ICD-10-CM | POA: Diagnosis not present

## 2015-06-06 DIAGNOSIS — I10 Essential (primary) hypertension: Secondary | ICD-10-CM

## 2015-06-06 DIAGNOSIS — K219 Gastro-esophageal reflux disease without esophagitis: Secondary | ICD-10-CM | POA: Diagnosis not present

## 2015-06-06 MED ORDER — OMEPRAZOLE 20 MG PO CPDR: 20.0000 mg | DELAYED_RELEASE_CAPSULE | Freq: Every day | ORAL | Status: DC

## 2015-06-06 MED ORDER — HYDROCHLOROTHIAZIDE 12.5 MG PO CAPS: ORAL_CAPSULE | ORAL | Status: DC

## 2015-06-06 MED ORDER — GEMFIBROZIL 600 MG PO TABS: ORAL_TABLET | ORAL | Status: DC

## 2015-06-06 MED ORDER — ALPRAZOLAM 0.25 MG PO TABS
0.2500 mg | ORAL_TABLET | ORAL | Status: DC
Start: 2015-06-06 — End: 2017-03-01

## 2015-06-06 MED ORDER — SIMVASTATIN 80 MG PO TABS
80.0000 mg | ORAL_TABLET | Freq: Every day | ORAL | Status: DC
Start: 2015-06-06 — End: 2015-11-11

## 2015-06-06 MED ORDER — METOPROLOL TARTRATE 100 MG PO TABS: ORAL_TABLET | ORAL | Status: DC
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
## 2015-06-07 LAB — LIPID PANEL
CHOL/HDL RATIO: 3.3 ratio (ref 0.0–4.4)
Cholesterol, Total: 194 mg/dL (ref 100–199)
HDL: 58 mg/dL (ref >39)
LDL Calculated: 115 mg/dL — ABNORMAL HIGH (ref 0–99)
Triglycerides: 104 mg/dL (ref 0–149)
VLDL CHOLESTEROL CAL: 21 mg/dL (ref 5–40)

## 2015-06-07 LAB — RENAL FUNCTION PANEL
ALBUMIN: 4.7 g/dL (ref 3.5–4.8)
BUN / CREAT RATIO: 19 (ref 12–28)
BUN: 15 mg/dL (ref 8–27)
CO2: 23 mmol/L (ref 18–29)
Calcium: 10.2 mg/dL (ref 8.7–10.3)
Chloride: 97 mmol/L (ref 96–106)
Creatinine, Ser: 0.77 mg/dL (ref 0.57–1.00)
GFR, EST AFRICAN AMERICAN: 87 mL/min/{1.73_m2} (ref >59)
GFR, EST NON AFRICAN AMERICAN: 75 mL/min/{1.73_m2} (ref >59)
Glucose: 98 mg/dL (ref 65–99)
POTASSIUM: 4.5 mmol/L (ref 3.5–5.2)
Phosphorus: 3.5 mg/dL (ref 2.5–4.5)
Sodium: 141 mmol/L (ref 134–144)

## 2015-06-18 ENCOUNTER — Other Ambulatory Visit: Payer: Self-pay | Admitting: Family Medicine

## 2015-06-26 DIAGNOSIS — D485 Neoplasm of uncertain behavior of skin: Secondary | ICD-10-CM | POA: Diagnosis not present

## 2015-06-26 DIAGNOSIS — D2272 Melanocytic nevi of left lower limb, including hip: Secondary | ICD-10-CM | POA: Diagnosis not present

## 2015-07-05 ENCOUNTER — Ambulatory Visit: Payer: Medicare HMO | Admitting: Family Medicine

## 2015-07-08 ENCOUNTER — Other Ambulatory Visit: Payer: Self-pay | Admitting: Family Medicine

## 2015-07-10 ENCOUNTER — Other Ambulatory Visit: Payer: Self-pay

## 2015-07-10 DIAGNOSIS — Z1231 Encounter for screening mammogram for malignant neoplasm of breast: Secondary | ICD-10-CM

## 2015-07-11 ENCOUNTER — Other Ambulatory Visit: Payer: Self-pay

## 2015-07-11 DIAGNOSIS — Z1231 Encounter for screening mammogram for malignant neoplasm of breast: Secondary | ICD-10-CM

## 2015-07-15 ENCOUNTER — Other Ambulatory Visit: Payer: Self-pay | Admitting: Family Medicine

## 2015-07-20 ENCOUNTER — Encounter: Payer: Self-pay | Admitting: Emergency Medicine

## 2015-07-20 ENCOUNTER — Ambulatory Visit (INDEPENDENT_AMBULATORY_CARE_PROVIDER_SITE_OTHER): Payer: Medicare HMO

## 2015-07-20 ENCOUNTER — Ambulatory Visit
Admission: EM | Admit: 2015-07-20 | Discharge: 2015-07-20 | Disposition: A | Discharge status: Home / Self Care | Payer: Medicare HMO | Attending: Family Medicine | Admitting: Family Medicine

## 2015-07-20 DIAGNOSIS — T148 Other injury of unspecified body region: Secondary | ICD-10-CM

## 2015-07-20 DIAGNOSIS — Z23 Encounter for immunization: Secondary | ICD-10-CM

## 2015-07-20 DIAGNOSIS — R0781 Pleurodynia: Secondary | ICD-10-CM

## 2015-07-20 DIAGNOSIS — T148XXA Other injury of unspecified body region, initial encounter: Secondary | ICD-10-CM

## 2015-07-20 MED ORDER — MUPIROCIN CALCIUM 2 % EX CREA: 1.0000 "application " | TOPICAL_CREAM | Freq: Two times a day (BID) | CUTANEOUS | Status: DC

## 2015-07-20 MED ORDER — TETANUS-DIPHTH-ACELL PERTUSSIS 5-2.5-18.5 LF-MCG/0.5 IM SUSP
0.5000 mL | Freq: Once | INTRAMUSCULAR | Status: AC
  Administered 2015-07-20: 0.5 mL via INTRAMUSCULAR
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
## 2015-07-20 NOTE — ED Provider Notes (Signed)
CSN: [id number]     Arrival date & time 07/20/15  [id number] History   First MD Initiated Contact with Patient 07/20/15 [phone]     Chief Complaint  Patient presents with  . Fall   (Consider location/radiation/quality/duration/timing/severity/associated sxs/prior Treatment) HPI: Patient presents today with left posterior rib pain. Patient states that she was moving a mattress earlier today and she fell back on some furniture and hit her left back side. She has an abrasion along the left posterior rib area. She has mild discomfort in that area. She also has some mild discomfort in the left anterior aspect of the rib cage. She denies any other injury. She denies any head injury. She is not on any blood thinners except for aspirin. Is unsure of a diagnosis of osteoporosis.She does not recall when her last tetanus immunization was.  Past Medical History  Diagnosis Date  . Breast cancer ([place]) 08/2012    rt mastectomy  . Melanoma ([place])   . Hypertension   . Hyperlipidemia   . GERD (gastroesophageal reflux disease)    Past Surgical History  Procedure Laterality Date  . Mastectomy Right 08/2012  . Vaginal hysterectomy    . Appendectomy    . Cholecystectomy    . Colonoscopy  2014    cleared   Family History  Problem Relation Age of Onset  . Breast cancer Sister   . Breast cancer Maternal Aunt    Social History  Substance Use Topics  . Smoking status: Never Smoker   . Smokeless tobacco: None  . Alcohol Use: No   OB History    No data available     Review of Systems: negative except mentioned above.   Allergies  Review of patient's allergies indicates no known allergies.  Home Medications   Prior to Admission medications   Medication Sig Start Date End Date Taking? Authorizing Provider  ALPRAZolam (XANAX) 0.25 MG tablet Take 1 tablet (0.25 mg total) by mouth 1 day or 1 dose. 06/06/15  Yes Juline Patch, MD  aspirin 325 MG tablet Take 325 mg by mouth daily.   Yes Historical Provider, MD   gemfibrozil (LOPID) 600 MG tablet TAKE 1 TABLET(600 MG) BY MOUTH TWICE DAILY 06/06/15  Yes Juline Patch, MD  hydrochlorothiazide (MICROZIDE) 12.5 MG capsule TAKE 1 CAPSULE BY MOUTH EVERY DAY 06/06/15  Yes Juline Patch, MD  latanoprost (XALATAN) 0.005 % ophthalmic solution Place 1 drop into both eyes at bedtime. 10/18/14  Yes Historical Provider, MD  metoprolol (LOPRESSOR) 100 MG tablet TAKE 1 TABLET(100 MG) BY MOUTH TWICE DAILY 06/06/15  Yes Juline Patch, MD  omeprazole (PRILOSEC) 20 MG capsule Take 1 capsule (20 mg total) by mouth daily. 06/06/15  Yes Juline Patch, MD  simvastatin (ZOCOR) 80 MG tablet Take 1 tablet (80 mg total) by mouth daily. 06/06/15  Yes Juline Patch, MD  simvastatin (ZOCOR) 80 MG tablet TAKE 1 TABLET(80 MG) BY MOUTH DAILY 07/17/15  Yes Juline Patch, MD  gemfibrozil (LOPID) 600 MG tablet TAKE 1 TABLET(600 MG) BY MOUTH TWICE DAILY 06/18/15   Juline Patch, MD  hydrochlorothiazide (MICROZIDE) 12.5 MG capsule TAKE 1 CAPSULE BY MOUTH EVERY DAY 06/18/15   Juline Patch, MD  metoprolol (LOPRESSOR) 100 MG tablet TAKE 1 TABLET BY MOUTH TWICE DAILY 06/18/15   Juline Patch, MD  omeprazole (PRILOSEC) 20 MG capsule TAKE ONE CAPSULE BY MOUTH EVERY DAY 07/08/15   Juline Patch, MD  simvastatin (ZOCOR) 80 MG tablet TAKE  1 TABLET(80 MG) BY MOUTH DAILY 06/18/15   Juline Patch, MD   Meds Ordered and Administered this Visit  Medications - No data to display  BP 183/65 mmHg  Pulse 71  Temp(Src) 97.8 F (36.6 C) (Tympanic)  Resp 16  Ht 5\' 5"  (1.651 m)  Wt 152 lb (68.947 kg)  BMI 25.29 kg/m2  SpO2 99% No data found.   Physical Exam   GENERAL: NAD RESP: CTA B CARD: RRR MSK: approx. 2 in. abrasion noted along left posterior rib area, no active bleeding, mild tenderness of the area, also some tenderness of left lower anterior rib area, no step off appreciated  NEURO: CN II-XII grossly intact   ED Course  Procedures (including critical care time)  Labs Review Labs Reviewed -  No data to display  Imaging Review No results found.    MDM  A/P: Left posterior rib injury, abrasion-x-rays are negative for any fracture or pneumothorax, will treat with Bactroban to abrasion, keep area clean and dry, over-the-counter pain medication when necessary, tetanus immunization given, seek medical attention if symptoms persist or worsen as discussed.    Paulina Fusi, MD 07/20/15 [phone]

## 2015-07-20 NOTE — Discharge Instructions (Signed)
Keep area clean and dry, apply antibiotic ointment to the area to help prevent infection, seek medical attention if symptoms persist or worsen as discussed.

## 2015-07-24 ENCOUNTER — Ambulatory Visit: Payer: Medicare Other

## 2015-07-25 ENCOUNTER — Encounter: Payer: Self-pay | Admitting: Family Medicine

## 2015-07-25 ENCOUNTER — Ambulatory Visit (INDEPENDENT_AMBULATORY_CARE_PROVIDER_SITE_OTHER): Payer: Medicare HMO | Admitting: Family Medicine

## 2015-07-25 VITALS — BP 130/80 | HR 72 | Ht 65.0 in | Wt 156.0 lb

## 2015-07-25 DIAGNOSIS — R0789 Other chest pain: Secondary | ICD-10-CM | POA: Diagnosis not present

## 2015-07-25 DIAGNOSIS — S7002XD Contusion of left hip, subsequent encounter: Secondary | ICD-10-CM | POA: Diagnosis not present

## 2015-07-25 DIAGNOSIS — S7012XD Contusion of left thigh, subsequent encounter: Secondary | ICD-10-CM

## 2015-07-25 DIAGNOSIS — W06XXXS Fall from bed, sequela: Secondary | ICD-10-CM | POA: Diagnosis not present

## 2015-07-25 MED ORDER — TRAMADOL HCL 50 MG PO TABS
50.0000 mg | ORAL_TABLET | Freq: Two times a day (BID) | ORAL | Status: DC
Start: 2015-07-25 — End: 2015-11-11

## 2015-07-25 NOTE — Progress Notes (Signed)
Name: Amber Galloway   MRN: [id number]    DOB: 1940-05-16   Date:07/25/2015       Progress Note  Subjective  Chief Complaint  Chief Complaint  Patient presents with  . Follow-up    urgent care/ fell over bed rail x 5 days ago- still hurting on L) side    Fall The accident occurred 5 to 7 days ago. The fall occurred while standing. She fell from a height of 1 to 2 ft. Impact surface: rail. There was no blood loss. The pain is present in the left hip and left upper leg (left chest). The pain is at a severity of 10/10. The pain is moderate. Exacerbated by: sneeze. Pertinent negatives include no abdominal pain, bowel incontinence, fever, headaches, hearing loss, hematuria, loss of consciousness, nausea, numbness, tingling, visual change or vomiting. She has tried NSAID for the symptoms. The treatment provided moderate relief.    No problem-specific assessment & plan notes found for this encounter.   Past Medical History  Diagnosis Date  . Breast cancer ([place]) 08/2012    rt mastectomy  . Melanoma ([place])   . Hypertension   . Hyperlipidemia   . GERD (gastroesophageal reflux disease)     Past Surgical History  Procedure Laterality Date  . Mastectomy Right 08/2012  . Vaginal hysterectomy    . Appendectomy    . Cholecystectomy    . Colonoscopy  2014    cleared    Family History  Problem Relation Age of Onset  . Breast cancer Sister   . Breast cancer Maternal Aunt     Social History   Social History  . Marital Status: Widowed    Spouse Name: N/A  . Number of Children: N/A  . Years of Education: N/A   Occupational History  . Not on file.   Social History Main Topics  . Smoking status: Never Smoker   . Smokeless tobacco: Not on file  . Alcohol Use: No  . Drug Use: No  . Sexual Activity: No   Other Topics Concern  . Not on file   Social History Narrative    No Known Allergies   Review of Systems  Constitutional: Negative for fever, chills, weight loss and  malaise/fatigue.  HENT: Negative for ear discharge, ear pain and sore throat.   Eyes: Negative for blurred vision.  Respiratory: Negative for cough, sputum production, shortness of breath and wheezing.   Cardiovascular: Negative for chest pain, palpitations and leg swelling.  Gastrointestinal: Negative for heartburn, nausea, vomiting, abdominal pain, diarrhea, constipation, blood in stool, melena and bowel incontinence.  Genitourinary: Negative for dysuria, urgency, frequency and hematuria.  Musculoskeletal: Negative for myalgias, back pain, joint pain and neck pain.  Skin: Negative for rash.  Neurological: Negative for dizziness, tingling, sensory change, focal weakness, loss of consciousness, numbness and headaches.  Endo/Heme/Allergies: Negative for environmental allergies and polydipsia. Does not bruise/bleed easily.  Psychiatric/Behavioral: Negative for depression and suicidal ideas. The patient is not nervous/anxious and does not have insomnia.      Objective  Filed Vitals:   07/25/15 1046  BP: 130/80  Pulse: 72  Height: 5\' 5"  (1.651 m)  Weight: 156 lb (70.761 kg)    Physical Exam  Constitutional: She is well-developed, well-nourished, and in no distress. No distress.  HENT:  Head: Normocephalic and atraumatic.  Right Ear: External ear normal.  Left Ear: External ear normal.  Nose: Nose normal.  Mouth/Throat: Oropharynx is clear and moist.  Eyes: Conjunctivae and EOM are  normal. Pupils are equal, round, and reactive to light. Right eye exhibits no discharge. Left eye exhibits no discharge.  Neck: Normal range of motion. Neck supple. No JVD present. No thyromegaly present.  Cardiovascular: Normal rate, regular rhythm, normal heart sounds and intact distal pulses.  Exam reveals no gallop and no friction rub.   No murmur heard. Pulmonary/Chest: Effort normal and breath sounds normal. No respiratory distress. She has no wheezes. She has no rales. She exhibits tenderness and  bony tenderness.  Lateral aspect left 9,10,11,12 ribs  Abdominal: Soft. Bowel sounds are normal. She exhibits no mass. There is no tenderness. There is no guarding.  Musculoskeletal: Normal range of motion. She exhibits no edema.  Lymphadenopathy:    She has no cervical adenopathy.  Neurological: She is alert. She has normal reflexes.  Skin: Skin is warm and dry. Bruising noted. She is not diaphoretic.     Psychiatric: Mood and affect normal.  Nursing note and vitals reviewed.     Assessment & Plan  Problem List Items Addressed This Visit    None    Visit Diagnoses    Acute chest wall pain    -  Primary    Relevant Medications    traMADol (ULTRAM) 50 MG tablet    Fall from bed, sequela        Follow up MUC    Relevant Medications    traMADol (ULTRAM) 50 MG tablet    Contusion of left hip and thigh, subsequent encounter        Relevant Medications    traMADol (ULTRAM) 50 MG tablet         Dr. Deanna [facility]  07/25/2015

## 2015-08-08 ENCOUNTER — Encounter: Payer: Self-pay | Admitting: Family Medicine

## 2015-08-08 ENCOUNTER — Ambulatory Visit (INDEPENDENT_AMBULATORY_CARE_PROVIDER_SITE_OTHER): Payer: Medicare HMO | Admitting: Family Medicine

## 2015-08-08 VITALS — BP 138/78 | HR 76 | Temp 98.2°F | Ht 65.0 in | Wt 153.0 lb

## 2015-08-08 DIAGNOSIS — S20212D Contusion of left front wall of thorax, subsequent encounter: Secondary | ICD-10-CM

## 2015-08-08 NOTE — Progress Notes (Signed)
Name: Amber Galloway   MRN: [id number]    DOB: [birth year]   Date:08/08/2015       Progress Note  Subjective  Chief Complaint  Chief Complaint  Patient presents with  . Follow-up    fall    Patient follow up for previous fall.   Fall  The accident occurred more than 1 week ago. The fall occurred while standing. The pain is at a severity of 4/10. The symptoms are aggravated by movement. Pertinent negatives include no abdominal pain, bowel incontinence, fever, headaches, hearing loss, hematuria, loss of consciousness, nausea, numbness, tingling, visual change or vomiting. She has tried NSAID and acetaminophen for the symptoms. The treatment provided moderate relief.    No problem-specific Assessment & Plan notes found for this encounter.   Past Medical History:  Diagnosis Date  . Breast cancer ([employer]) 08/2012   rt mastectomy  . GERD (gastroesophageal reflux disease)   . Hyperlipidemia   . Hypertension   . Melanoma [facility])     Past Surgical History:  Procedure Laterality Date  . APPENDECTOMY    . CHOLECYSTECTOMY    . COLONOSCOPY  2014   cleared  . MASTECTOMY Right 08/2012  . VAGINAL HYSTERECTOMY      Family History  Problem Relation Age of Onset  . Breast cancer Sister   . Breast cancer Maternal Aunt     Social History   Social History  . Marital status: Widowed    Spouse name: N/A  . Number of children: N/A  . Years of education: N/A   Occupational History  . Not on file.   Social History Main Topics  . Smoking status: Never Smoker  . Smokeless tobacco: Never Used  . Alcohol use No  . Drug use: No  . Sexual activity: No   Other Topics Concern  . Not on file   Social History Narrative  . No narrative on file    No Known Allergies   Review of Systems  Constitutional: Negative for chills, fever, malaise/fatigue and weight loss.  HENT: Negative for ear discharge, ear pain and sore throat.   Eyes: Negative for blurred vision.  Respiratory: Negative  for cough, sputum production, shortness of breath and wheezing.   Cardiovascular: Negative for chest pain, palpitations and leg swelling.  Gastrointestinal: Negative for abdominal pain, blood in stool, bowel incontinence, constipation, diarrhea, heartburn, melena, nausea and vomiting.  Genitourinary: Negative for dysuria, frequency, hematuria and urgency.  Musculoskeletal: Negative for back pain, joint pain, myalgias and neck pain.  Skin: Negative for rash.  Neurological: Negative for dizziness, tingling, sensory change, focal weakness, loss of consciousness, numbness and headaches.  Endo/Heme/Allergies: Negative for environmental allergies and polydipsia. Does not bruise/bleed easily.  Psychiatric/Behavioral: Negative for depression and suicidal ideas. The patient is not nervous/anxious and does not have insomnia.      Objective  Vitals:   08/08/15 0832  BP: 138/78  Pulse: 76  Temp: 98.2 F (36.8 C)  Weight: 153 lb (69.4 kg)  Height: 5\' 5"  (1.651 m)    Physical Exam  Constitutional: She is well-developed, well-nourished, and in no distress. No distress.  HENT:  Head: Normocephalic and atraumatic.  Right Ear: External ear normal.  Left Ear: External ear normal.  Nose: Nose normal.  Mouth/Throat: Oropharynx is clear and moist.  Eyes: Conjunctivae and EOM are normal. Pupils are equal, round, and reactive to light. Right eye exhibits no discharge. Left eye exhibits no discharge.  Neck: Normal range of motion. Neck supple. No JVD  present. No thyromegaly present.  Cardiovascular: Normal rate, regular rhythm, S1 normal, S2 normal, normal heart sounds and intact distal pulses.  Exam reveals no gallop and no friction rub.   No murmur heard. Pulmonary/Chest: Effort normal and breath sounds normal. She has no wheezes. She has no rales.  Abdominal: Soft. Bowel sounds are normal. She exhibits no mass. There is no tenderness. There is no guarding.  Musculoskeletal: Normal range of motion.  She exhibits no edema.  Left lateral chest wall tenderness  Lymphadenopathy:    She has no cervical adenopathy.  Neurological: She is alert. She has normal reflexes.  Skin: Skin is warm and dry. She is not diaphoretic.  Psychiatric: Mood and affect normal.  Nursing note and vitals reviewed.     Assessment & Plan  Problem List Items Addressed This Visit    None    Visit Diagnoses    Chest wall contusion, left, subsequent encounter    -  Primary        Dr. Otilio Miu [facility]  08/08/15

## 2015-08-13 ENCOUNTER — Ambulatory Visit
Admission: RE | Admit: 2015-08-13 | Discharge: 2015-08-13 | Disposition: A | Discharge status: Home / Self Care | Payer: Medicare HMO | Source: Ambulatory Visit | Attending: Family Medicine | Admitting: Family Medicine

## 2015-08-13 DIAGNOSIS — E782 Mixed hyperlipidemia: Secondary | ICD-10-CM | POA: Diagnosis not present

## 2015-08-13 DIAGNOSIS — I10 Essential (primary) hypertension: Secondary | ICD-10-CM | POA: Diagnosis not present

## 2015-08-13 DIAGNOSIS — Z1231 Encounter for screening mammogram for malignant neoplasm of breast: Secondary | ICD-10-CM

## 2015-08-13 DIAGNOSIS — Z0000 Encounter for general adult medical examination without abnormal findings: Secondary | ICD-10-CM | POA: Diagnosis not present

## 2015-08-13 DIAGNOSIS — K219 Gastro-esophageal reflux disease without esophagitis: Secondary | ICD-10-CM | POA: Diagnosis not present

## 2015-10-04 ENCOUNTER — Other Ambulatory Visit: Payer: Self-pay | Admitting: Family Medicine

## 2015-10-13 ENCOUNTER — Other Ambulatory Visit: Payer: Self-pay | Admitting: Family Medicine

## 2015-10-15 ENCOUNTER — Other Ambulatory Visit: Payer: Self-pay | Admitting: Family Medicine

## 2015-11-11 ENCOUNTER — Ambulatory Visit (INDEPENDENT_AMBULATORY_CARE_PROVIDER_SITE_OTHER): Payer: Medicare HMO | Admitting: Family Medicine

## 2015-11-11 ENCOUNTER — Encounter: Payer: Self-pay | Admitting: Family Medicine

## 2015-11-11 VITALS — BP 120/80 | HR 70 | Ht 65.0 in | Wt 153.0 lb

## 2015-11-11 DIAGNOSIS — I10 Essential (primary) hypertension: Secondary | ICD-10-CM

## 2015-11-11 DIAGNOSIS — Z23 Encounter for immunization: Secondary | ICD-10-CM

## 2015-11-11 DIAGNOSIS — K219 Gastro-esophageal reflux disease without esophagitis: Secondary | ICD-10-CM

## 2015-11-11 DIAGNOSIS — E782 Mixed hyperlipidemia: Secondary | ICD-10-CM | POA: Diagnosis not present

## 2015-11-11 MED ORDER — SIMVASTATIN 80 MG PO TABS: 80.0000 mg | ORAL_TABLET | Freq: Every day | ORAL | 6 refills | Status: DC

## 2015-11-11 MED ORDER — METOPROLOL TARTRATE 100 MG PO TABS: ORAL_TABLET | ORAL | 6 refills | Status: DC

## 2015-11-11 MED ORDER — OMEPRAZOLE 20 MG PO CPDR: 20.0000 mg | DELAYED_RELEASE_CAPSULE | Freq: Every day | ORAL | 6 refills | Status: DC

## 2015-11-11 MED ORDER — HYDROCHLOROTHIAZIDE 12.5 MG PO CAPS: ORAL_CAPSULE | ORAL | 6 refills | Status: DC

## 2015-11-11 MED ORDER — GEMFIBROZIL 600 MG PO TABS: ORAL_TABLET | ORAL | 6 refills | Status: DC

## 2015-11-11 NOTE — Progress Notes (Signed)
Name: Amber Galloway   MRN: [id number]    DOB: March 01, 1940   Date:11/11/2015       Progress Note  Subjective  Chief Complaint  Chief Complaint  Patient presents with  . Hypertension  . Hyperlipidemia  . Gastroesophageal Reflux    Hypertension  This is a chronic problem. The current episode started more than 1 year ago. The problem has been waxing and waning since onset. The problem is controlled. Pertinent negatives include no anxiety, blurred vision, chest pain, headaches, malaise/fatigue, neck pain, orthopnea, palpitations, peripheral edema, PND, shortness of breath or sweats. There are no associated agents to hypertension. There are no known risk factors for coronary artery disease. Past treatments include diuretics and beta blockers. The current treatment provides moderate improvement. There are no compliance problems.  There is no history of angina, kidney disease, CAD/MI, CVA, heart failure, left ventricular hypertrophy, PVD, renovascular disease or retinopathy. There is no history of chronic renal disease or a hypertension causing med.  Hyperlipidemia  This is a chronic problem. The current episode started more than 1 year ago. The problem is controlled. Recent lipid tests were reviewed and are normal. She has no history of chronic renal disease. There are no known factors aggravating her hyperlipidemia. Pertinent negatives include no chest pain, focal sensory loss, focal weakness, leg pain, myalgias or shortness of breath. Current antihyperlipidemic treatment includes statins and fibric acid derivatives. The current treatment provides mild improvement of lipids. There are no compliance problems.  Risk factors for coronary artery disease include dyslipidemia.  Gastroesophageal Reflux  She reports no abdominal pain, no chest pain, no choking, no coughing, no dysphagia, no early satiety, no globus sensation, no heartburn, no nausea, no sore throat or no wheezing. This is a recurrent problem.  The current episode started more than 1 year ago. The problem occurs occasionally. The problem has been gradually improving. Nothing aggravates the symptoms. Pertinent negatives include no anemia, fatigue, melena, muscle weakness, orthopnea or weight loss. There are no known risk factors. She has tried a PPI for the symptoms. The treatment provided moderate relief.    No problem-specific Assessment & Plan notes found for this encounter.   Past Medical History:  Diagnosis Date  . Breast cancer ([place]) 08/2012   rt mastectomy  . GERD (gastroesophageal reflux disease)   . Hyperlipidemia   . Hypertension   . Melanoma [facility])     Past Surgical History:  Procedure Laterality Date  . APPENDECTOMY    . CHOLECYSTECTOMY    . COLONOSCOPY  2014   cleared  . MASTECTOMY Right 08/2012  . VAGINAL HYSTERECTOMY      Family History  Problem Relation Age of Onset  . Breast cancer Sister 3  . Breast cancer Maternal Aunt 80    Social History   Social History  . Marital status: Widowed    Spouse name: N/A  . Number of children: N/A  . Years of education: N/A   Occupational History  . Not on file.   Social History Main Topics  . Smoking status: Never Smoker  . Smokeless tobacco: Never Used  . Alcohol use No  . Drug use: No  . Sexual activity: No   Other Topics Concern  . Not on file   Social History Narrative  . No narrative on file    No Known Allergies   Review of Systems  Constitutional: Negative for chills, fatigue, fever, malaise/fatigue and weight loss.  HENT: Negative for ear discharge, ear pain and sore  throat.   Eyes: Negative for blurred vision, photophobia and pain.  Respiratory: Negative for cough, sputum production, choking, shortness of breath and wheezing.   Cardiovascular: Negative for chest pain, palpitations, orthopnea, leg swelling and PND.  Gastrointestinal: Negative for abdominal pain, blood in stool, constipation, diarrhea, dysphagia, heartburn, melena and  nausea.  Genitourinary: Negative for dysuria, frequency, hematuria and urgency.  Musculoskeletal: Negative for back pain, joint pain, myalgias, muscle weakness and neck pain.  Skin: Negative for rash.  Neurological: Negative for dizziness, tingling, sensory change, focal weakness and headaches.  Endo/Heme/Allergies: Negative for environmental allergies and polydipsia. Does not bruise/bleed easily.  Psychiatric/Behavioral: Negative for depression and suicidal ideas. The patient is not nervous/anxious and does not have insomnia.      Objective  Vitals:   11/11/15 0842  BP: 120/80  Pulse: 70  Weight: 153 lb (69.4 kg)  Height: 5\' 5"  (1.651 m)    Physical Exam  Constitutional: She is well-developed, well-nourished, and in no distress. No distress.  HENT:  Head: Normocephalic and atraumatic.  Right Ear: Tympanic membrane, external ear and ear canal normal.  Left Ear: Tympanic membrane, external ear and ear canal normal.  Nose: Nose normal.  Mouth/Throat: Oropharynx is clear and moist.  Eyes: Conjunctivae and EOM are normal. Pupils are equal, round, and reactive to light. Right eye exhibits no discharge. Left eye exhibits no discharge.  Neck: Normal range of motion. Neck supple. No JVD present. No thyromegaly present.  Cardiovascular: Normal rate, regular rhythm, S1 normal, S2 normal, normal heart sounds and intact distal pulses.  PMI is not displaced.  Exam reveals no gallop, no S3, no S4 and no friction rub.   No murmur heard. Pulmonary/Chest: Effort normal and breath sounds normal. She has no wheezes. She has no rales. She exhibits no tenderness.  Abdominal: Soft. Bowel sounds are normal. She exhibits no mass. There is no hepatosplenomegaly. There is no tenderness. There is no guarding.  Musculoskeletal: Normal range of motion. She exhibits no edema.  Lymphadenopathy:    She has no cervical adenopathy.  Neurological: She is alert. She has normal reflexes.  Skin: Skin is warm and  dry. She is not diaphoretic.  Psychiatric: Mood and affect normal.  Nursing note and vitals reviewed.     Assessment & Plan  Problem List Items Addressed This Visit      Cardiovascular and Mediastinum   Essential hypertension - Primary   Relevant Medications   hydrochlorothiazide (MICROZIDE) 12.5 MG capsule   metoprolol (LOPRESSOR) 100 MG tablet   gemfibrozil (LOPID) 600 MG tablet   simvastatin (ZOCOR) 80 MG tablet   Other Relevant Orders   Renal Function Panel     Digestive   Esophageal reflux   Relevant Medications   omeprazole (PRILOSEC) 20 MG capsule     Other   Hyperlipidemia   Relevant Medications   hydrochlorothiazide (MICROZIDE) 12.5 MG capsule   metoprolol (LOPRESSOR) 100 MG tablet   gemfibrozil (LOPID) 600 MG tablet   simvastatin (ZOCOR) 80 MG tablet   Other Relevant Orders   Lipid Profile    Other Visit Diagnoses    Flu vaccine need       Relevant Orders   Flu Vaccine QUAD 36+ mos PF IM (Fluarix & Fluzone Quad PF) (Completed)     I spent 25 minutes with this patient, More than 50% of that time was spent in face to face education, counseling and care coordination.   Dr. [facility]  11/11/15   

## 2015-11-12 LAB — LIPID PANEL
CHOL/HDL RATIO: 4.3 ratio (ref 0.0–4.4)
Cholesterol, Total: 210 mg/dL — ABNORMAL HIGH (ref 100–199)
HDL: 49 mg/dL (ref >39)
LDL CALC: 138 mg/dL — AB (ref 0–99)
Triglycerides: 114 mg/dL (ref 0–149)
VLDL CHOLESTEROL CAL: 23 mg/dL (ref 5–40)

## 2015-11-12 LAB — RENAL FUNCTION PANEL
Albumin: 4.6 g/dL (ref 3.5–4.8)
BUN / CREAT RATIO: 18 (ref 12–28)
BUN: 12 mg/dL (ref 8–27)
CO2: 24 mmol/L (ref 18–29)
CREATININE: 0.66 mg/dL (ref 0.57–1.00)
Calcium: 9.7 mg/dL (ref 8.7–10.3)
Chloride: 103 mmol/L (ref 96–106)
GFR, EST AFRICAN AMERICAN: 99 mL/min/{1.73_m2} (ref >59)
GFR, EST NON AFRICAN AMERICAN: 85 mL/min/{1.73_m2} (ref >59)
Glucose: 111 mg/dL — ABNORMAL HIGH (ref 65–99)
Phosphorus: 3 mg/dL (ref 2.5–4.5)
Potassium: 5.2 mmol/L (ref 3.5–5.2)
Sodium: 143 mmol/L (ref 134–144)

## 2015-12-17 DIAGNOSIS — L570 Actinic keratosis: Secondary | ICD-10-CM | POA: Diagnosis not present

## 2015-12-17 DIAGNOSIS — Z85828 Personal history of other malignant neoplasm of skin: Secondary | ICD-10-CM | POA: Diagnosis not present

## 2015-12-17 DIAGNOSIS — Z1283 Encounter for screening for malignant neoplasm of skin: Secondary | ICD-10-CM | POA: Diagnosis not present

## 2015-12-17 DIAGNOSIS — Z872 Personal history of diseases of the skin and subcutaneous tissue: Secondary | ICD-10-CM | POA: Diagnosis not present

## 2015-12-17 DIAGNOSIS — Z08 Encounter for follow-up examination after completed treatment for malignant neoplasm: Secondary | ICD-10-CM | POA: Diagnosis not present

## 2015-12-17 DIAGNOSIS — Z85820 Personal history of malignant melanoma of skin: Secondary | ICD-10-CM | POA: Diagnosis not present

## 2016-01-09 DIAGNOSIS — H40023 Open angle with borderline findings, high risk, bilateral: Secondary | ICD-10-CM | POA: Diagnosis not present

## 2016-01-27 ENCOUNTER — Telehealth: Payer: Self-pay

## 2016-01-27 NOTE — Telephone Encounter (Signed)
Patient needs to switch to [facility] again due to insurance changes. She needs refills on Meclizine whichj I do not see on list anmd Simvastatin as well as Omeprazole.

## 2016-01-27 NOTE — Telephone Encounter (Signed)
I called pt and left message on answering machine- done

## 2016-01-28 ENCOUNTER — Other Ambulatory Visit: Payer: Self-pay

## 2016-01-28 DIAGNOSIS — R42 Dizziness and giddiness: Secondary | ICD-10-CM

## 2016-01-28 DIAGNOSIS — K219 Gastro-esophageal reflux disease without esophagitis: Secondary | ICD-10-CM

## 2016-01-28 MED ORDER — MECLIZINE HCL 25 MG PO TABS: 25.0000 mg | ORAL_TABLET | Freq: Three times a day (TID) | ORAL | 2 refills | Status: DC | PRN

## 2016-01-28 MED ORDER — OMEPRAZOLE 20 MG PO CPDR: 20.0000 mg | DELAYED_RELEASE_CAPSULE | Freq: Every day | ORAL | 6 refills | Status: DC

## 2016-03-23 ENCOUNTER — Other Ambulatory Visit: Payer: Self-pay | Admitting: Family Medicine

## 2016-03-23 DIAGNOSIS — E782 Mixed hyperlipidemia: Secondary | ICD-10-CM

## 2016-03-26 ENCOUNTER — Telehealth: Payer: Self-pay

## 2016-03-26 NOTE — Telephone Encounter (Signed)
Wants shingles vaccine sent to [facility]- called in

## 2016-03-30 ENCOUNTER — Ambulatory Visit
Admission: RE | Admit: 2016-03-30 | Discharge: 2016-03-30 | Disposition: A | Discharge status: Home / Self Care | Payer: Medicare HMO | Source: Ambulatory Visit | Attending: Family Medicine | Admitting: Family Medicine

## 2016-03-30 ENCOUNTER — Ambulatory Visit (INDEPENDENT_AMBULATORY_CARE_PROVIDER_SITE_OTHER): Payer: Medicare HMO | Admitting: Family Medicine

## 2016-03-30 VITALS — BP 120/70 | HR 70 | Ht 65.0 in | Wt 154.0 lb

## 2016-03-30 DIAGNOSIS — R0781 Pleurodynia: Secondary | ICD-10-CM | POA: Diagnosis not present

## 2016-03-30 DIAGNOSIS — R079 Chest pain, unspecified: Secondary | ICD-10-CM | POA: Diagnosis not present

## 2016-03-30 DIAGNOSIS — Z9010 Acquired absence of unspecified breast and nipple: Secondary | ICD-10-CM | POA: Insufficient documentation

## 2016-03-30 DIAGNOSIS — M25511 Pain in right shoulder: Secondary | ICD-10-CM | POA: Diagnosis not present

## 2016-03-30 DIAGNOSIS — R0789 Other chest pain: Secondary | ICD-10-CM

## 2016-03-30 MED ORDER — TRAMADOL HCL 50 MG PO TABS: 50.0000 mg | ORAL_TABLET | Freq: Three times a day (TID) | ORAL | 0 refills | Status: DC | PRN

## 2016-03-30 NOTE — Progress Notes (Signed)
Name: Amber Galloway   MRN: [id number]    DOB: 1940-01-27   Date:03/30/2016       Progress Note  Subjective  Chief Complaint  Chief Complaint  Patient presents with  . Shoulder Pain    R) shoulder blade pain- fell with mattresses in July of last year- has been able to control the pain with Advil but now the pain is going up into the neck- can't lay back in a recliner due to pain. Had a mastectomy of R) breast in 2015    Shoulder Pain   Pain location: right posterior chest wall/scapula. This is a chronic problem. The current episode started more than 1 month ago. There has been a history of trauma. The problem occurs daily. The problem has been waxing and waning. The quality of the pain is described as dull and burning. The pain is at a severity of 8/10. The pain is moderate. Associated symptoms include a limited range of motion. Pertinent negatives include no fever or tingling. The symptoms are aggravated by activity (moving right arm). She has tried NSAIDS and oral narcotics for the symptoms. The treatment provided no relief.    No problem-specific Assessment & Plan notes found for this encounter.   Past Medical History:  Diagnosis Date  . Breast cancer ([place]) 08/2012   rt mastectomy  . GERD (gastroesophageal reflux disease)   . Hyperlipidemia   . Hypertension   . Melanoma [facility])     Past Surgical History:  Procedure Laterality Date  . APPENDECTOMY    . CHOLECYSTECTOMY    . COLONOSCOPY  2014   cleared  . MASTECTOMY Right 08/2012  . VAGINAL HYSTERECTOMY      Family History  Problem Relation Age of Onset  . Breast cancer Sister 21  . Breast cancer Maternal Aunt 80    Social History   Social History  . Marital status: Widowed    Spouse name: N/A  . Number of children: N/A  . Years of education: N/A   Occupational History  . Not on file.   Social History Main Topics  . Smoking status: Never Smoker  . Smokeless tobacco: Never Used  . Alcohol use No  . Drug use:  No  . Sexual activity: No   Other Topics Concern  . Not on file   Social History Narrative  . No narrative on file    No Known Allergies  Outpatient Medications Prior to Visit  Medication Sig Dispense Refill  . ALPRAZolam (XANAX) 0.25 MG tablet Take 1 tablet (0.25 mg total) by mouth 1 day or 1 dose. 30 tablet 0  . aspirin 325 MG tablet Take 325 mg by mouth daily.    Marland Kitchen gemfibrozil (LOPID) 600 MG tablet TAKE 1 TABLET(600 MG) BY MOUTH TWICE DAILY 60 tablet 6  . hydrochlorothiazide (MICROZIDE) 12.5 MG capsule TAKE 1 CAPSULE BY MOUTH EVERY DAY 30 capsule 6  . latanoprost (XALATAN) 0.005 % ophthalmic solution Place 1 drop into both eyes at bedtime.  6  . meclizine (ANTIVERT) 25 MG tablet Take 1 tablet (25 mg total) by mouth 3 (three) times daily as needed for dizziness. 30 tablet 2  . metoprolol (LOPRESSOR) 100 MG tablet TAKE 1 TABLET(100 MG) BY MOUTH TWICE DAILY 60 tablet 6  . omeprazole (PRILOSEC) 20 MG capsule Take 1 capsule (20 mg total) by mouth daily. 30 capsule 6  . simvastatin (ZOCOR) 80 MG tablet TAKE 1 TABLET BY MOUTH DAILY 30 tablet 0   No facility-administered medications prior  to visit.     Review of Systems  Constitutional: Negative for chills, fever, malaise/fatigue and weight loss.  HENT: Negative for ear discharge, ear pain and sore throat.   Eyes: Negative for blurred vision.  Respiratory: Negative for cough, hemoptysis, sputum production, shortness of breath and wheezing.   Cardiovascular: Negative for chest pain, palpitations, orthopnea, claudication, leg swelling and PND.  Gastrointestinal: Negative for abdominal pain, blood in stool, constipation, diarrhea, heartburn, melena and nausea.  Genitourinary: Negative for dysuria, flank pain, frequency, hematuria and urgency.  Musculoskeletal: Positive for back pain. Negative for joint pain, myalgias and neck pain.  Skin: Negative for rash.  Neurological: Negative for dizziness, tingling, sensory change, focal weakness  and headaches.  Endo/Heme/Allergies: Negative for environmental allergies and polydipsia. Does not bruise/bleed easily.  Psychiatric/Behavioral: Negative for depression and suicidal ideas. The patient is not nervous/anxious and does not have insomnia.      Objective  Vitals:   03/30/16 1133  BP: 120/70  Pulse: 70  Weight: 154 lb (69.9 kg)  Height: 5\' 5"  (1.651 m)    Physical Exam  Constitutional: She is well-developed, well-nourished, and in no distress. No distress.  HENT:  Head: Normocephalic and atraumatic.  Right Ear: External ear normal.  Left Ear: External ear normal.  Nose: Nose normal.  Mouth/Throat: Oropharynx is clear and moist.  Eyes: Conjunctivae and EOM are normal. Pupils are equal, round, and reactive to light. Right eye exhibits no discharge. Left eye exhibits no discharge.  Neck: Normal range of motion. Neck supple. No JVD present. No thyromegaly present.  Cardiovascular: Normal rate, regular rhythm, normal heart sounds and intact distal pulses.  Exam reveals no gallop and no friction rub.   No murmur heard. Pulmonary/Chest: Effort normal and breath sounds normal. She has no wheezes. She has no rales. She exhibits tenderness.  Abdominal: Soft. Bowel sounds are normal. She exhibits no mass. There is no tenderness. There is no guarding.  Musculoskeletal: She exhibits no edema.  Lymphadenopathy:    She has no cervical adenopathy.  Neurological: She is alert.  Skin: Skin is warm and dry. She is not diaphoretic.  Psychiatric: Mood and affect normal.      Assessment & Plan  Problem List Items Addressed This Visit    None    Visit Diagnoses    Right-sided chest wall pain    -  Primary   Relevant Medications   traMADol (ULTRAM) 50 MG tablet   Other Relevant Orders   DG Ribs Unilateral Right (Completed)   DG Chest 2 View (Completed)   DG Scapula Right (Completed)      Meds ordered this encounter  Medications  . traMADol (ULTRAM) 50 MG tablet    Sig:  Take 1 tablet (50 mg total) by mouth every 8 (eight) hours as needed.    Dispense:  30 tablet    Refill:  0      Dr. Lesha Jager [facility]  03/30/16

## 2016-04-06 DIAGNOSIS — Z859 Personal history of malignant neoplasm, unspecified: Secondary | ICD-10-CM | POA: Diagnosis not present

## 2016-04-06 DIAGNOSIS — L821 Other seborrheic keratosis: Secondary | ICD-10-CM | POA: Diagnosis not present

## 2016-04-06 DIAGNOSIS — Z86018 Personal history of other benign neoplasm: Secondary | ICD-10-CM | POA: Diagnosis not present

## 2016-04-06 DIAGNOSIS — Z85820 Personal history of malignant melanoma of skin: Secondary | ICD-10-CM | POA: Diagnosis not present

## 2016-04-06 DIAGNOSIS — Z872 Personal history of diseases of the skin and subcutaneous tissue: Secondary | ICD-10-CM | POA: Diagnosis not present

## 2016-04-06 DIAGNOSIS — L570 Actinic keratosis: Secondary | ICD-10-CM | POA: Diagnosis not present

## 2016-04-20 DIAGNOSIS — H40023 Open angle with borderline findings, high risk, bilateral: Secondary | ICD-10-CM | POA: Diagnosis not present

## 2016-06-01 ENCOUNTER — Other Ambulatory Visit: Payer: Self-pay

## 2016-06-05 ENCOUNTER — Ambulatory Visit (INDEPENDENT_AMBULATORY_CARE_PROVIDER_SITE_OTHER): Payer: Medicare HMO | Admitting: Family Medicine

## 2016-06-05 VITALS — BP 130/80 | HR 70 | Resp 16 | Ht 65.0 in | Wt 154.0 lb

## 2016-06-05 DIAGNOSIS — K219 Gastro-esophageal reflux disease without esophagitis: Secondary | ICD-10-CM | POA: Diagnosis not present

## 2016-06-05 DIAGNOSIS — R42 Dizziness and giddiness: Secondary | ICD-10-CM | POA: Insufficient documentation

## 2016-06-05 DIAGNOSIS — I10 Essential (primary) hypertension: Secondary | ICD-10-CM

## 2016-06-05 DIAGNOSIS — E782 Mixed hyperlipidemia: Secondary | ICD-10-CM

## 2016-06-05 MED ORDER — SIMVASTATIN 80 MG PO TABS: 80.0000 mg | ORAL_TABLET | Freq: Every day | ORAL | 1 refills | Status: DC

## 2016-06-05 MED ORDER — OMEPRAZOLE 20 MG PO CPDR: 20.0000 mg | DELAYED_RELEASE_CAPSULE | Freq: Every day | ORAL | 1 refills | Status: DC

## 2016-06-05 MED ORDER — HYDROCHLOROTHIAZIDE 12.5 MG PO CAPS: ORAL_CAPSULE | ORAL | 1 refills | Status: DC

## 2016-06-05 MED ORDER — GEMFIBROZIL 600 MG PO TABS: ORAL_TABLET | ORAL | 1 refills | Status: DC

## 2016-06-05 MED ORDER — METOPROLOL TARTRATE 50 MG PO TABS: 50.0000 mg | ORAL_TABLET | Freq: Two times a day (BID) | ORAL | 1 refills | Status: DC

## 2016-06-05 MED ORDER — MECLIZINE HCL 25 MG PO TABS: 25.0000 mg | ORAL_TABLET | Freq: Three times a day (TID) | ORAL | 2 refills | Status: DC | PRN

## 2016-06-05 NOTE — Progress Notes (Signed)
Name: Amber Galloway   MRN: [id number]    DOB: 08-09-1940   Date:06/05/2016       Progress Note  Subjective  Chief Complaint  Chief Complaint  Patient presents with  . Hypertension    Hypertension  This is a chronic problem. The current episode started more than 1 year ago. The problem is unchanged. The problem is controlled. Pertinent negatives include no anxiety, blurred vision, chest pain, headaches, malaise/fatigue, neck pain, orthopnea, palpitations, peripheral edema, PND, shortness of breath or sweats. (Orthostatic dizziness) There are no associated agents to hypertension. There are no known risk factors for coronary artery disease. Past treatments include beta blockers and diuretics. The current treatment provides moderate improvement. There are no compliance problems.  There is no history of angina, kidney disease, CAD/MI, CVA, heart failure, left ventricular hypertrophy, PVD or retinopathy. There is no history of chronic renal disease, a hypertension causing med or renovascular disease.    No problem-specific Assessment & Plan notes found for this encounter.   Past Medical History:  Diagnosis Date  . Breast cancer ([place]) 08/2012   rt mastectomy  . GERD (gastroesophageal reflux disease)   . Hyperlipidemia   . Hypertension   . Melanoma [facility])     Past Surgical History:  Procedure Laterality Date  . APPENDECTOMY    . CHOLECYSTECTOMY    . COLONOSCOPY  2014   cleared  . MASTECTOMY Right 08/2012  . VAGINAL HYSTERECTOMY      Family History  Problem Relation Age of Onset  . Breast cancer Sister 40  . Breast cancer Maternal Aunt 80    Social History   Social History  . Marital status: Widowed    Spouse name: N/A  . Number of children: N/A  . Years of education: N/A   Occupational History  . Not on file.   Social History Main Topics  . Smoking status: Never Smoker  . Smokeless tobacco: Never Used  . Alcohol use No  . Drug use: No  . Sexual activity: No    Other Topics Concern  . Not on file   Social History Narrative  . No narrative on file    No Known Allergies  Outpatient Medications Prior to Visit  Medication Sig Dispense Refill  . ALPRAZolam (XANAX) 0.25 MG tablet Take 1 tablet (0.25 mg total) by mouth 1 day or 1 dose. 30 tablet 0  . aspirin 325 MG tablet Take 325 mg by mouth daily.    Marland Kitchen latanoprost (XALATAN) 0.005 % ophthalmic solution Place 1 drop into both eyes at bedtime.  6  . traMADol (ULTRAM) 50 MG tablet Take 1 tablet (50 mg total) by mouth every 8 (eight) hours as needed. 30 tablet 0  . gemfibrozil (LOPID) 600 MG tablet TAKE 1 TABLET(600 MG) BY MOUTH TWICE DAILY 60 tablet 6  . hydrochlorothiazide (MICROZIDE) 12.5 MG capsule TAKE 1 CAPSULE BY MOUTH EVERY DAY 30 capsule 6  . meclizine (ANTIVERT) 25 MG tablet Take 1 tablet (25 mg total) by mouth 3 (three) times daily as needed for dizziness. 30 tablet 2  . metoprolol (LOPRESSOR) 100 MG tablet TAKE 1 TABLET(100 MG) BY MOUTH TWICE DAILY 60 tablet 6  . omeprazole (PRILOSEC) 20 MG capsule Take 1 capsule (20 mg total) by mouth daily. 30 capsule 6  . simvastatin (ZOCOR) 80 MG tablet TAKE 1 TABLET BY MOUTH DAILY 30 tablet 0   No facility-administered medications prior to visit.     Review of Systems  Constitutional: Negative for chills,  fever, malaise/fatigue and weight loss.  HENT: Negative for ear discharge, ear pain and sore throat.   Eyes: Negative for blurred vision.  Respiratory: Negative for cough, sputum production, shortness of breath and wheezing.   Cardiovascular: Negative for chest pain, palpitations, orthopnea, leg swelling and PND.  Gastrointestinal: Negative for abdominal pain, blood in stool, constipation, diarrhea, heartburn, melena and nausea.  Genitourinary: Negative for dysuria, frequency, hematuria and urgency.  Musculoskeletal: Negative for back pain, joint pain, myalgias and neck pain.  Skin: Negative for rash.  Neurological: Negative for dizziness,  tingling, sensory change, focal weakness and headaches.  Endo/Heme/Allergies: Negative for environmental allergies and polydipsia. Does not bruise/bleed easily.  Psychiatric/Behavioral: Negative for depression and suicidal ideas. The patient is not nervous/anxious and does not have insomnia.      Objective  Vitals:   06/05/16 0825  BP: 130/80  Pulse: 70  Resp: 16  Weight: 154 lb (69.9 kg)  Height: 5\' 5"  (1.651 m)    Physical Exam  Constitutional: She is well-developed, well-nourished, and in no distress. No distress.  HENT:  Head: Normocephalic and atraumatic.  Right Ear: External ear normal.  Left Ear: External ear normal.  Nose: Nose normal.  Mouth/Throat: Oropharynx is clear and moist.  Eyes: Conjunctivae and EOM are normal. Pupils are equal, round, and reactive to light. Right eye exhibits no discharge. Left eye exhibits no discharge.  Neck: Normal range of motion. Neck supple. No JVD present. No thyromegaly present.  Cardiovascular: Normal rate, regular rhythm, normal heart sounds and intact distal pulses.  Exam reveals no gallop and no friction rub.   No murmur heard. Pulmonary/Chest: Effort normal and breath sounds normal. She has no wheezes. She has no rales.  Abdominal: Soft. Bowel sounds are normal. She exhibits no mass. There is no tenderness. There is no guarding.  Musculoskeletal: Normal range of motion. She exhibits no edema.  Lymphadenopathy:    She has no cervical adenopathy.  Neurological: She is alert. She has normal reflexes.  Skin: Skin is warm and dry. She is not diaphoretic.  Psychiatric: Mood and affect normal.  Nursing note and vitals reviewed.     Assessment & Plan  Problem List Items Addressed This Visit      Cardiovascular and Mediastinum   Essential hypertension - Primary   Relevant Medications   metoprolol tartrate (LOPRESSOR) 50 MG tablet   hydrochlorothiazide (MICROZIDE) 12.5 MG capsule   simvastatin (ZOCOR) 80 MG tablet   gemfibrozil  (LOPID) 600 MG tablet   Other Relevant Orders   Renal Function Panel     Digestive   Gastroesophageal reflux disease without esophagitis   Relevant Medications   meclizine (ANTIVERT) 25 MG tablet   omeprazole (PRILOSEC) 20 MG capsule     Other   Mixed hyperlipidemia   Relevant Medications   metoprolol tartrate (LOPRESSOR) 50 MG tablet   hydrochlorothiazide (MICROZIDE) 12.5 MG capsule   simvastatin (ZOCOR) 80 MG tablet   gemfibrozil (LOPID) 600 MG tablet   Other Relevant Orders   Lipid Profile   Dizziness   Relevant Medications   meclizine (ANTIVERT) 25 MG tablet    Other Visit Diagnoses    Orthostatic dizziness          Meds ordered this encounter  Medications  . metoprolol tartrate (LOPRESSOR) 50 MG tablet    Sig: Take 1 tablet (50 mg total) by mouth 2 (two) times daily.    Dispense:  180 tablet    Refill:  1  . meclizine (ANTIVERT) 25 MG  tablet    Sig: Take 1 tablet (25 mg total) by mouth 3 (three) times daily as needed for dizziness.    Dispense:  30 tablet    Refill:  2  . hydrochlorothiazide (MICROZIDE) 12.5 MG capsule    Sig: TAKE 1 CAPSULE BY MOUTH EVERY DAY    Dispense:  90 capsule    Refill:  1  . omeprazole (PRILOSEC) 20 MG capsule    Sig: Take 1 capsule (20 mg total) by mouth daily.    Dispense:  90 capsule    Refill:  1  . simvastatin (ZOCOR) 80 MG tablet    Sig: Take 1 tablet (80 mg total) by mouth daily.    Dispense:  90 tablet    Refill:  1  . gemfibrozil (LOPID) 600 MG tablet    Sig: TAKE 1 TABLET(600 MG) BY MOUTH TWICE DAILY    Dispense:  180 tablet    Refill:  1      Dr. Deanna Jones [facility]  06/05/16

## 2016-06-06 LAB — RENAL FUNCTION PANEL
ALBUMIN: 4.8 g/dL (ref 3.5–4.8)
BUN/Creatinine Ratio: 14 (ref 12–28)
BUN: 11 mg/dL (ref 8–27)
CO2: 26 mmol/L (ref 18–29)
CREATININE: 0.76 mg/dL (ref 0.57–1.00)
Calcium: 9.9 mg/dL (ref 8.7–10.3)
Chloride: 94 mmol/L — ABNORMAL LOW (ref 96–106)
GFR, EST AFRICAN AMERICAN: 88 mL/min/{1.73_m2} (ref >59)
GFR, EST NON AFRICAN AMERICAN: 76 mL/min/{1.73_m2} (ref >59)
GLUCOSE: 111 mg/dL — AB (ref 65–99)
PHOSPHORUS: 3.1 mg/dL (ref 2.5–4.5)
POTASSIUM: 3.9 mmol/L (ref 3.5–5.2)
Sodium: 139 mmol/L (ref 134–144)

## 2016-06-06 LAB — LIPID PANEL
CHOL/HDL RATIO: 3.7 ratio (ref 0.0–4.4)
Cholesterol, Total: 194 mg/dL (ref 100–199)
HDL: 52 mg/dL (ref >39)
LDL CALC: 116 mg/dL — AB (ref 0–99)
TRIGLYCERIDES: 131 mg/dL (ref 0–149)
VLDL CHOLESTEROL CAL: 26 mg/dL (ref 5–40)

## 2016-09-15 ENCOUNTER — Ambulatory Visit (INDEPENDENT_AMBULATORY_CARE_PROVIDER_SITE_OTHER): Payer: Medicare HMO | Admitting: Family Medicine

## 2016-09-15 ENCOUNTER — Encounter: Payer: Self-pay | Admitting: Family Medicine

## 2016-09-15 VITALS — BP 120/70 | HR 60 | Ht 64.0 in | Wt 151.0 lb

## 2016-09-15 DIAGNOSIS — R42 Dizziness and giddiness: Secondary | ICD-10-CM | POA: Diagnosis not present

## 2016-09-15 DIAGNOSIS — Z1231 Encounter for screening mammogram for malignant neoplasm of breast: Secondary | ICD-10-CM | POA: Diagnosis not present

## 2016-09-15 DIAGNOSIS — H8309 Labyrinthitis, unspecified ear: Secondary | ICD-10-CM | POA: Diagnosis not present

## 2016-09-15 DIAGNOSIS — Z1239 Encounter for other screening for malignant neoplasm of breast: Secondary | ICD-10-CM

## 2016-09-15 MED ORDER — MECLIZINE HCL 25 MG PO TABS: 25.0000 mg | ORAL_TABLET | Freq: Three times a day (TID) | ORAL | 2 refills | Status: DC | PRN

## 2016-09-15 NOTE — Patient Instructions (Signed)
Labyrinthitis Labyrinthitis is an infection of the inner ear. Your inner ear is a fluid-filled system of tubes and canals (labyrinth). Nerve cells in your inner ear send signals for hearing and balance to your brain. When tiny germs (microorganisms) get inside the labyrinth, they harm the cells that send messages to the brain. Labyrinthitis can cause changes in hearing and balance. Most cases of labyrinthitis come on suddenly and they clear up within weeks. If the infection damages parts of the labyrinth, some symptoms may remain (chronic labyrinthitis). What are the causes? Viruses are the most common cause of labyrinthitis. Viruses that spread into the labyrinth are the same viruses that cause other diseases, such as:  Mononucleosis.  Measles.  Flu.  Herpes.  Bacteria can also cause labyrinthitis when they spread into the labyrinth from an infection in the brain or the middle ear. Bacteria can cause:  Serous labyrinthitis. This type of labyrinthitis develops when bacteria produce a poison (toxin) that gets inside the labyrinth.  Suppurative labyrinthitis. This type of labyrinthitis develops when bacteria get inside the labyrinth.  What increases the risk? You may be at greater risk for labyrinthitis if you:  Drink a lot of alcohol.  Smoke.  Take certain drugs.  Are not well rested (fatigued).  Are under a lot of stress.  Have allergies.  Recently had a nose or throat infection (upper respiratory infection) or an ear infection.  What are the signs or symptoms? Symptoms of labyrinthitis usually start suddenly. The symptoms can be mild or strong and may include:  Dizziness.  Hearing loss.  A feeling that you are moving when you are not (vertigo).  Ringing in the ear (tinnitus).  Nausea and vomiting.  Trouble focusing your eyes.  Symptoms of chronic labyrinthitis may include:  Fatigue.  Confusion.  Hearing loss.  Tinnitus.  Poor balance.  Vertigo after  sudden head movements.  How is this diagnosed? Your health care provider may suspect labyrinthitis if you suddenly get dizzy and lose hearing, especially if you had a recent upper respiratory infection. Your health care provider will perform a physical exam to:  Check your ears for infection.  Test your balance.  Check your eye movement.  Your health care provider may do several tests to rule out other causes of your symptoms and to help make a diagnosis of labyrinthitis. These may include:  Imaging studies, such as a CT scan or an MRI, to look for other causes of your symptoms.  Hearing tests.  Electronystagmography (ENG) to check your balance.  How is this treated? Treatment of labyrinthitis depends on the cause. If your labyrinthitis is caused by a virus, it may get better without treatment. If your labyrinthitis is caused by bacteria, you may need medicine to fight the infection (antibiotic medicine). You may also have treatment to relieve labyrinthitis symptoms. Treatments may include:  Medicines to: ? Stop dizziness. ? Relieve nausea. ? Treat the inflamed area. ? Speed up your recovery.  Bed rest until dizziness goes away.  Fluids given through an IV tube. You may need this treatment if you have too little fluid in your body (dehydrated) from repeated nausea and vomiting.  Follow these instructions at home:  Take medicines only as directed by your health care provider.  If you were prescribed an antibiotic medicine, finish all of it even if you start to feel better.  Rest as much as possible.  Avoid loud noises and bright lights.  Do not make sudden movements until any dizziness goes  away.  Do not drive until your health care provider says that you can.  Drink enough fluid to keep your urine clear or pale yellow.  Work with a physical therapist if you still feel dizzy after several weeks. A therapist can teach you exercises to help you adjust to feeling dizzy  (vestibular rehabilitation exercises).  Keep all follow-up visits as directed by your health care provider. This is important. Contact a health care provider if:  Your symptoms are not relieved by medicines.  Your symptoms last longer than two weeks.  You have a fever. Get help right away if:  You become very dizzy.  You have nausea or vomiting that does not go away.  Your hearing gets much worse very quickly. This information is not intended to replace advice given to you by your health care provider. Make sure you discuss any questions you have with your health care provider. Document Released: 01/19/2014 Document Revised: 06/06/2015 Document Reviewed: 08/30/2013 Elsevier Interactive Patient Education  [employer].

## 2016-09-15 NOTE — Progress Notes (Signed)
Name: Amber Galloway   MRN: [id number]    DOB: [birth year]   Date:09/15/2016       Progress Note  Subjective  Chief Complaint  Chief Complaint  Patient presents with  . Dizziness    started Sat with dizziness and some nausea- took meclizine- usually helps but this time has not and has lasted for 3 days    Dizziness  This is a recurrent problem. The current episode started in the past 7 days (since Saturday). The problem occurs constantly. The problem has been waxing and waning. Associated symptoms include nausea and vertigo. Pertinent negatives include no abdominal pain, chest pain, chills, coughing, diaphoresis, fever, headaches, myalgias, neck pain, rash or sore throat. The symptoms are aggravated by bending. Treatments tried: meclizine. The treatment provided mild relief.    No problem-specific Assessment & Plan notes found for this encounter.   Past Medical History:  Diagnosis Date  . Breast cancer ([place]) 08/2012   rt mastectomy  . GERD (gastroesophageal reflux disease)   . Hyperlipidemia   . Hypertension   . Melanoma [facility])     Past Surgical History:  Procedure Laterality Date  . APPENDECTOMY    . CHOLECYSTECTOMY    . COLONOSCOPY  2014   cleared  . MASTECTOMY Right 08/2012  . VAGINAL HYSTERECTOMY      Family History  Problem Relation Age of Onset  . Breast cancer Sister 43  . Breast cancer Maternal Aunt 80    Social History   Social History  . Marital status: Widowed    Spouse name: N/A  . Number of children: N/A  . Years of education: N/A   Occupational History  . Not on file.   Social History Main Topics  . Smoking status: Never Smoker  . Smokeless tobacco: Never Used  . Alcohol use No  . Drug use: No  . Sexual activity: No   Other Topics Concern  . Not on file   Social History Narrative  . No narrative on file    No Known Allergies  Outpatient Medications Prior to Visit  Medication Sig Dispense Refill  . ALPRAZolam (XANAX) 0.25 MG tablet  Take 1 tablet (0.25 mg total) by mouth 1 day or 1 dose. 30 tablet 0  . aspirin 325 MG tablet Take 325 mg by mouth daily.    Marland Kitchen gemfibrozil (LOPID) 600 MG tablet TAKE 1 TABLET(600 MG) BY MOUTH TWICE DAILY 180 tablet 1  . hydrochlorothiazide (MICROZIDE) 12.5 MG capsule TAKE 1 CAPSULE BY MOUTH EVERY DAY 90 capsule 1  . latanoprost (XALATAN) 0.005 % ophthalmic solution Place 1 drop into both eyes at bedtime.  6  . metoprolol tartrate (LOPRESSOR) 50 MG tablet Take 1 tablet (50 mg total) by mouth 2 (two) times daily. 180 tablet 1  . omeprazole (PRILOSEC) 20 MG capsule Take 1 capsule (20 mg total) by mouth daily. 90 capsule 1  . simvastatin (ZOCOR) 80 MG tablet Take 1 tablet (80 mg total) by mouth daily. 90 tablet 1  . meclizine (ANTIVERT) 25 MG tablet Take 1 tablet (25 mg total) by mouth 3 (three) times daily as needed for dizziness. 30 tablet 2  . traMADol (ULTRAM) 50 MG tablet Take 1 tablet (50 mg total) by mouth every 8 (eight) hours as needed. 30 tablet 0   No facility-administered medications prior to visit.     Review of Systems  Constitutional: Negative for chills, diaphoresis, fever, malaise/fatigue and weight loss.  HENT: Negative for ear discharge, ear pain and sore  throat.   Eyes: Negative for blurred vision.  Respiratory: Negative for cough, sputum production, shortness of breath and wheezing.   Cardiovascular: Negative for chest pain, palpitations and leg swelling.  Gastrointestinal: Positive for nausea. Negative for abdominal pain, blood in stool, constipation, diarrhea, heartburn and melena.  Genitourinary: Negative for dysuria, frequency, hematuria and urgency.  Musculoskeletal: Negative for back pain, joint pain, myalgias and neck pain.  Skin: Negative for rash.  Neurological: Positive for dizziness and vertigo. Negative for tingling, sensory change, focal weakness and headaches.  Endo/Heme/Allergies: Negative for environmental allergies and polydipsia. Does not bruise/bleed  easily.  Psychiatric/Behavioral: Negative for depression and suicidal ideas. The patient is not nervous/anxious and does not have insomnia.      Objective  Vitals:   09/15/16 1423  BP: 120/70  Pulse: 60  Weight: 151 lb (68.5 kg)  Height: 5\' 4"  (1.626 m)    Physical Exam  Constitutional: She is well-developed, well-nourished, and in no distress. No distress.  HENT:  Head: Normocephalic and atraumatic.  Right Ear: External ear normal.  Left Ear: External ear normal.  Nose: Nose normal.  Mouth/Throat: Oropharynx is clear and moist.  Eyes: Pupils are equal, round, and reactive to light. Conjunctivae and EOM are normal. Right eye exhibits no discharge. Left eye exhibits no discharge.  Neck: Normal range of motion. Neck supple. No JVD present. No thyromegaly present.  Cardiovascular: Normal rate, regular rhythm, normal heart sounds and intact distal pulses.  Exam reveals no gallop and no friction rub.   No murmur heard. Pulmonary/Chest: Effort normal and breath sounds normal. She has no wheezes. She has no rales. Left breast exhibits no inverted nipple, no mass, no nipple discharge, no skin change and no tenderness.  Abdominal: Soft. Bowel sounds are normal. She exhibits no mass. There is no tenderness. There is no guarding.  Musculoskeletal: Normal range of motion. She exhibits no edema.  Lymphadenopathy:    She has no cervical adenopathy.  Neurological: She is alert. She has normal reflexes.  Skin: Skin is warm and dry. She is not diaphoretic.  Psychiatric: Mood and affect normal.  Nursing note and vitals reviewed.     Assessment & Plan  Problem List Items Addressed This Visit      Other   Dizziness - Primary   Relevant Medications   meclizine (ANTIVERT) 25 MG tablet   Other Relevant Orders   Ambulatory referral to ENT    Other Visit Diagnoses    Labyrinthitis, unspecified laterality       Relevant Medications   meclizine (ANTIVERT) 25 MG tablet   Other Relevant  Orders   Ambulatory referral to ENT   Breast screening       Relevant Orders   MM Digital Screening Unilat L      Meds ordered this encounter  Medications  . meclizine (ANTIVERT) 25 MG tablet    Sig: Take 1 tablet (25 mg total) by mouth 3 (three) times daily as needed for dizziness.    Dispense:  30 tablet    Refill:  2      Dr. Macon [facility]  09/15/16

## 2016-09-17 DIAGNOSIS — L821 Other seborrheic keratosis: Secondary | ICD-10-CM | POA: Diagnosis not present

## 2016-10-07 ENCOUNTER — Ambulatory Visit
Admission: RE | Admit: 2016-10-07 | Discharge: 2016-10-07 | Disposition: A | Discharge status: Home / Self Care | Payer: Medicare HMO | Source: Ambulatory Visit | Attending: Family Medicine | Admitting: Family Medicine

## 2016-10-07 DIAGNOSIS — Z1239 Encounter for other screening for malignant neoplasm of breast: Secondary | ICD-10-CM

## 2016-10-07 DIAGNOSIS — Z1231 Encounter for screening mammogram for malignant neoplasm of breast: Secondary | ICD-10-CM | POA: Insufficient documentation

## 2016-10-20 DIAGNOSIS — R42 Dizziness and giddiness: Secondary | ICD-10-CM | POA: Diagnosis not present

## 2016-10-20 DIAGNOSIS — H903 Sensorineural hearing loss, bilateral: Secondary | ICD-10-CM | POA: Diagnosis not present

## 2016-11-27 ENCOUNTER — Other Ambulatory Visit: Payer: Self-pay | Admitting: Family Medicine

## 2016-11-27 DIAGNOSIS — I10 Essential (primary) hypertension: Secondary | ICD-10-CM

## 2016-11-27 DIAGNOSIS — E782 Mixed hyperlipidemia: Secondary | ICD-10-CM

## 2016-12-03 ENCOUNTER — Other Ambulatory Visit: Payer: Self-pay | Admitting: Family Medicine

## 2016-12-03 DIAGNOSIS — K219 Gastro-esophageal reflux disease without esophagitis: Secondary | ICD-10-CM

## 2017-01-08 ENCOUNTER — Ambulatory Visit: Payer: Medicare HMO | Admitting: Family Medicine

## 2017-01-08 ENCOUNTER — Encounter: Payer: Self-pay | Admitting: Family Medicine

## 2017-01-08 VITALS — BP 124/80 | HR 80 | Temp 98.2°F | Ht 64.0 in | Wt 151.0 lb

## 2017-01-08 DIAGNOSIS — J0100 Acute maxillary sinusitis, unspecified: Secondary | ICD-10-CM

## 2017-01-08 MED ORDER — AZITHROMYCIN 250 MG PO TABS: ORAL_TABLET | ORAL | 0 refills | Status: DC

## 2017-01-08 NOTE — Progress Notes (Signed)
Name: Amber Galloway   MRN: [id number]    DOB: July 25, 1940   Date:01/08/2017       Progress Note  Subjective  Chief Complaint  Chief Complaint  Patient presents with  . Sinusitis    cough and cong- taking Robitussin DM-     Sinusitis  This is a new problem. The current episode started in the past 7 days. The problem has been waxing and waning since onset. There has been no fever. The fever has been present for 3 to 4 days. The pain is mild. Associated symptoms include congestion, coughing, headaches, a hoarse voice, sinus pressure and sneezing. Pertinent negatives include no chills, diaphoresis, ear pain, neck pain, shortness of breath or sore throat. Treatments tried: Rob DM. The treatment provided mild relief.  Cough  This is a new problem. The problem has been waxing and waning. The cough is productive of purulent sputum (green). Associated symptoms include headaches. Pertinent negatives include no chest pain, chills, ear pain, fever, heartburn, myalgias, nasal congestion, postnasal drip, rash, sore throat, shortness of breath, weight loss or wheezing. Nothing aggravates the symptoms. There is no history of environmental allergies.    No problem-specific Assessment & Plan notes found for this encounter.   Past Medical History:  Diagnosis Date  . Breast cancer ([place]) 08/2012   rt mastectomy  . GERD (gastroesophageal reflux disease)   . Hyperlipidemia   . Hypertension   . Melanoma [facility])     Past Surgical History:  Procedure Laterality Date  . APPENDECTOMY    . CHOLECYSTECTOMY    . COLONOSCOPY  2014   cleared  . MASTECTOMY Right 08/2012  . VAGINAL HYSTERECTOMY      Family History  Problem Relation Age of Onset  . Breast cancer Sister 104  . Breast cancer Maternal Aunt 80    Social History   Socioeconomic History  . Marital status: Widowed    Spouse name: Not on file  . Number of children: Not on file  . Years of education: Not on file  . Highest education level:  Not on file  Social Needs  . Financial resource strain: Not on file  . Food insecurity - worry: Not on file  . Food insecurity - inability: Not on file  . Transportation needs - medical: Not on file  . Transportation needs - non-medical: Not on file  Occupational History  . Not on file  Tobacco Use  . Smoking status: Never Smoker  . Smokeless tobacco: Never Used  Substance and Sexual Activity  . Alcohol use: No    Alcohol/week: 0.0 oz  . Drug use: No  . Sexual activity: No  Other Topics Concern  . Not on file  Social History Narrative  . Not on file    No Known Allergies  Outpatient Medications Prior to Visit  Medication Sig Dispense Refill  . ALPRAZolam (XANAX) 0.25 MG tablet Take 1 tablet (0.25 mg total) by mouth 1 day or 1 dose. 30 tablet 0  . aspirin 325 MG tablet Take 325 mg by mouth daily.    Marland Kitchen gemfibrozil (LOPID) 600 MG tablet TAKE 1 TABLET(600 MG) BY MOUTH TWICE DAILY 180 tablet 0  . hydrochlorothiazide (MICROZIDE) 12.5 MG capsule TAKE 1 CAPSULE BY MOUTH EVERY DAY 90 capsule 0  . latanoprost (XALATAN) 0.005 % ophthalmic solution Place 1 drop into both eyes at bedtime.  6  . meclizine (ANTIVERT) 25 MG tablet Take 1 tablet (25 mg total) by mouth 3 (three) times daily as  needed for dizziness. 30 tablet 2  . metoprolol tartrate (LOPRESSOR) 50 MG tablet TAKE 1 TABLET BY MOUTH TWICE A DAY 180 tablet 0  . omeprazole (PRILOSEC) 20 MG capsule TAKE ONE CAPSULE BY MOUTH EVERY DAY 90 capsule 1  . simvastatin (ZOCOR) 80 MG tablet Take 1 tablet (80 mg total) by mouth daily. 90 tablet 1   No facility-administered medications prior to visit.     Review of Systems  Constitutional: Negative for chills, diaphoresis, fever, malaise/fatigue and weight loss.  HENT: Positive for congestion, hoarse voice, sinus pressure and sneezing. Negative for ear discharge, ear pain, postnasal drip and sore throat.   Eyes: Negative for blurred vision.  Respiratory: Positive for cough. Negative for  sputum production, shortness of breath and wheezing.   Cardiovascular: Negative for chest pain, palpitations and leg swelling.  Gastrointestinal: Negative for abdominal pain, blood in stool, constipation, diarrhea, heartburn, melena and nausea.  Genitourinary: Negative for dysuria, frequency, hematuria and urgency.  Musculoskeletal: Negative for back pain, joint pain, myalgias and neck pain.  Skin: Negative for rash.  Neurological: Positive for headaches. Negative for dizziness, tingling, sensory change and focal weakness.  Endo/Heme/Allergies: Negative for environmental allergies and polydipsia. Does not bruise/bleed easily.  Psychiatric/Behavioral: Negative for depression and suicidal ideas. The patient is not nervous/anxious and does not have insomnia.      Objective  Vitals:   01/08/17 1519  BP: 124/80  Pulse: 80  Temp: 98.2 F (36.8 C)  TempSrc: Oral  Weight: 151 lb (68.5 kg)  Height: 5\' 4"  (1.626 m)    Physical Exam  Constitutional: She is well-developed, well-nourished, and in no distress. No distress.  HENT:  Head: Normocephalic and atraumatic.  Right Ear: External ear normal.  Left Ear: External ear normal.  Nose: Right sinus exhibits maxillary sinus tenderness. Left sinus exhibits maxillary sinus tenderness.  Mouth/Throat: Oropharynx is clear and moist.  Eyes: Conjunctivae and EOM are normal. Pupils are equal, round, and reactive to light. Right eye exhibits no discharge. Left eye exhibits no discharge.  Neck: Normal range of motion. Neck supple. No JVD present. No thyromegaly present.  Cardiovascular: Normal rate, regular rhythm, normal heart sounds and intact distal pulses. Exam reveals no gallop and no friction rub.  No murmur heard. Pulmonary/Chest: Effort normal and breath sounds normal. She has no wheezes. She has no rales.  Abdominal: Soft. Bowel sounds are normal. She exhibits no mass. There is no tenderness. There is no rebound and no guarding.   Musculoskeletal: Normal range of motion. She exhibits no edema.  Lymphadenopathy:    She has no cervical adenopathy.  Neurological: She is alert. She has normal reflexes.  Skin: Skin is warm and dry. She is not diaphoretic.  Psychiatric: Mood and affect normal.  Nursing note and vitals reviewed.     Assessment & Plan  Problem List Items Addressed This Visit    None    Visit Diagnoses    Acute maxillary sinusitis, recurrence not specified    -  Primary   Relevant Medications   azithromycin (ZITHROMAX) 250 MG tablet      Meds ordered this encounter  Medications  . azithromycin (ZITHROMAX) 250 MG tablet    Sig: 2 today then 1 a day for 4 days    Dispense:  6 tablet    Refill:  0      Dr. [facility]  01/08/17

## 2017-01-09 ENCOUNTER — Other Ambulatory Visit: Payer: Self-pay | Admitting: Family Medicine

## 2017-01-09 DIAGNOSIS — E782 Mixed hyperlipidemia: Secondary | ICD-10-CM

## 2017-01-18 ENCOUNTER — Ambulatory Visit (INDEPENDENT_AMBULATORY_CARE_PROVIDER_SITE_OTHER): Payer: Medicare HMO

## 2017-01-18 DIAGNOSIS — Z23 Encounter for immunization: Secondary | ICD-10-CM | POA: Diagnosis not present

## 2017-02-10 ENCOUNTER — Other Ambulatory Visit: Payer: Self-pay | Admitting: Family Medicine

## 2017-02-10 DIAGNOSIS — E782 Mixed hyperlipidemia: Secondary | ICD-10-CM

## 2017-02-11 ENCOUNTER — Other Ambulatory Visit: Payer: Self-pay | Admitting: Family Medicine

## 2017-02-11 DIAGNOSIS — E782 Mixed hyperlipidemia: Secondary | ICD-10-CM

## 2017-02-24 ENCOUNTER — Other Ambulatory Visit: Payer: Self-pay

## 2017-02-24 ENCOUNTER — Other Ambulatory Visit: Payer: Self-pay | Admitting: Family Medicine

## 2017-02-24 DIAGNOSIS — I10 Essential (primary) hypertension: Secondary | ICD-10-CM

## 2017-03-01 ENCOUNTER — Encounter: Payer: Self-pay | Admitting: Family Medicine

## 2017-03-01 ENCOUNTER — Ambulatory Visit (INDEPENDENT_AMBULATORY_CARE_PROVIDER_SITE_OTHER): Payer: Medicare HMO | Admitting: Family Medicine

## 2017-03-01 VITALS — BP 120/80 | HR 68 | Ht 64.0 in | Wt 159.0 lb

## 2017-03-01 DIAGNOSIS — K219 Gastro-esophageal reflux disease without esophagitis: Secondary | ICD-10-CM

## 2017-03-01 DIAGNOSIS — R42 Dizziness and giddiness: Secondary | ICD-10-CM | POA: Diagnosis not present

## 2017-03-01 DIAGNOSIS — Z23 Encounter for immunization: Secondary | ICD-10-CM | POA: Diagnosis not present

## 2017-03-01 DIAGNOSIS — H8309 Labyrinthitis, unspecified ear: Secondary | ICD-10-CM

## 2017-03-01 DIAGNOSIS — Z780 Asymptomatic menopausal state: Secondary | ICD-10-CM | POA: Diagnosis not present

## 2017-03-01 DIAGNOSIS — E663 Overweight: Secondary | ICD-10-CM

## 2017-03-01 DIAGNOSIS — F419 Anxiety disorder, unspecified: Secondary | ICD-10-CM

## 2017-03-01 DIAGNOSIS — E782 Mixed hyperlipidemia: Secondary | ICD-10-CM | POA: Diagnosis not present

## 2017-03-01 DIAGNOSIS — I10 Essential (primary) hypertension: Secondary | ICD-10-CM

## 2017-03-01 DIAGNOSIS — R69 Illness, unspecified: Secondary | ICD-10-CM | POA: Diagnosis not present

## 2017-03-01 MED ORDER — HYDROCHLOROTHIAZIDE 12.5 MG PO CAPS
12.5000 mg | ORAL_CAPSULE | Freq: Every day | ORAL | 1 refills | Status: DC
Start: 2017-03-01 — End: 2017-08-10

## 2017-03-01 MED ORDER — METOPROLOL TARTRATE 50 MG PO TABS: 50.0000 mg | ORAL_TABLET | Freq: Two times a day (BID) | ORAL | 1 refills | Status: DC

## 2017-03-01 MED ORDER — MECLIZINE HCL 25 MG PO TABS: 25.0000 mg | ORAL_TABLET | Freq: Three times a day (TID) | ORAL | 2 refills | Status: DC | PRN

## 2017-03-01 MED ORDER — OMEPRAZOLE 20 MG PO CPDR: 20.0000 mg | DELAYED_RELEASE_CAPSULE | Freq: Every day | ORAL | 1 refills | Status: DC

## 2017-03-01 MED ORDER — GEMFIBROZIL 600 MG PO TABS: ORAL_TABLET | ORAL | 1 refills | Status: DC

## 2017-03-01 MED ORDER — ALPRAZOLAM 0.25 MG PO TABS: 0.2500 mg | ORAL_TABLET | ORAL | 1 refills | Status: DC

## 2017-03-01 MED ORDER — SIMVASTATIN 80 MG PO TABS: 80.0000 mg | ORAL_TABLET | Freq: Every day | ORAL | 1 refills | Status: DC

## 2017-03-01 NOTE — Progress Notes (Signed)
Name: Amber Galloway   MRN: [id number]    DOB: January 22, 1940   Date:03/01/2017       Progress Note  Subjective  Chief Complaint  Chief Complaint  Patient presents with  . Hyperlipidemia    wants 90 days on meds  . Gastroesophageal Reflux  . Hypertension  . Dizziness  . pneum vacc    needs 23    Hyperlipidemia  This is a chronic problem. The current episode started more than 1 year ago. The problem is controlled. Recent lipid tests were reviewed and are normal. She has no history of chronic renal disease, diabetes, hypothyroidism, liver disease, obesity or nephrotic syndrome. There are no known factors aggravating her hyperlipidemia. Pertinent negatives include no chest pain, focal sensory loss, focal weakness, leg pain, myalgias or shortness of breath. Current antihyperlipidemic treatment includes fibric acid derivatives and statins. The current treatment provides moderate improvement of lipids. There are no compliance problems.  Risk factors for coronary artery disease include dyslipidemia and hypertension.  Gastroesophageal Reflux  She reports no abdominal pain, no belching, no chest pain, no choking, no coughing, no dysphagia, no early satiety, no heartburn, no hoarse voice, no nausea, no sore throat, no stridor, no tooth decay or no wheezing. This is a chronic problem. The current episode started more than 1 year ago. The problem has been gradually worsening. Nothing aggravates the symptoms. Pertinent negatives include no anemia, fatigue, melena, muscle weakness, orthopnea or weight loss. She has tried a PPI and a diet change (sleep on left side) for the symptoms. The treatment provided moderate relief.  Hypertension  This is a chronic problem. The current episode started more than 1 year ago. The problem is unchanged. The problem is controlled. Pertinent negatives include no anxiety, blurred vision, chest pain, headaches, malaise/fatigue, neck pain, orthopnea, palpitations, peripheral  edema, PND, shortness of breath or sweats. There are no associated agents to hypertension. Risk factors for coronary artery disease include dyslipidemia. Past treatments include beta blockers. The current treatment provides moderate improvement. There are no compliance problems.  There is no history of angina, kidney disease, CAD/MI, CVA, heart failure, left ventricular hypertrophy, PVD or retinopathy. There is no history of chronic renal disease, a hypertension causing med or renovascular disease.  Dizziness  This is a chronic problem. The current episode started more than 1 year ago. The problem occurs intermittently. Pertinent negatives include no abdominal pain, chest pain, chills, coughing, fatigue, fever, headaches, myalgias, nausea, neck pain, rash or sore throat. Treatments tried: meclizine. The treatment provided moderate (especially riding) relief.  Anxiety  Presents for follow-up visit. Symptoms include dizziness, excessive worry and nervous/anxious behavior. Patient reports no chest pain, insomnia, nausea, palpitations, shortness of breath or suicidal ideas. The severity of symptoms is mild. The quality of sleep is good.      No problem-specific Assessment & Plan notes found for this encounter.   Past Medical History:  Diagnosis Date  . Breast cancer ([place]) 08/2012   rt mastectomy  . GERD (gastroesophageal reflux disease)   . Hyperlipidemia   . Hypertension   . Melanoma [facility])     Past Surgical History:  Procedure Laterality Date  . APPENDECTOMY    . CHOLECYSTECTOMY    . COLONOSCOPY  2014   cleared  . MASTECTOMY Right 08/2012  . VAGINAL HYSTERECTOMY      Family History  Problem Relation Age of Onset  . Breast cancer Sister 23  . Breast cancer Maternal Aunt 80    Social History  Socioeconomic History  . Marital status: Widowed    Spouse name: Not on file  . Number of children: Not on file  . Years of education: Not on file  . Highest education level: Not on file   Social Needs  . Financial resource strain: Not on file  . Food insecurity - worry: Not on file  . Food insecurity - inability: Not on file  . Transportation needs - medical: Not on file  . Transportation needs - non-medical: Not on file  Occupational History  . Not on file  Tobacco Use  . Smoking status: Never Smoker  . Smokeless tobacco: Never Used  Substance and Sexual Activity  . Alcohol use: No    Alcohol/week: 0.0 oz  . Drug use: No  . Sexual activity: No  Other Topics Concern  . Not on file  Social History Narrative  . Not on file    No Known Allergies  Outpatient Medications Prior to Visit  Medication Sig Dispense Refill  . aspirin 325 MG tablet Take 325 mg by mouth daily.    Marland Kitchen latanoprost (XALATAN) 0.005 % ophthalmic solution Place 1 drop into both eyes at bedtime.  6  . ALPRAZolam (XANAX) 0.25 MG tablet Take 1 tablet (0.25 mg total) by mouth 1 day or 1 dose. 30 tablet 0  . gemfibrozil (LOPID) 600 MG tablet TAKE 1 TABLET(600 MG) BY MOUTH TWICE DAILY 180 tablet 0  . hydrochlorothiazide (MICROZIDE) 12.5 MG capsule TAKE 1 CAPSULE BY MOUTH EVERY DAY 90 capsule 0  . meclizine (ANTIVERT) 25 MG tablet Take 1 tablet (25 mg total) by mouth 3 (three) times daily as needed for dizziness. 30 tablet 2  . metoprolol tartrate (LOPRESSOR) 50 MG tablet TAKE 1 TABLET BY MOUTH TWICE A DAY 180 tablet 0  . omeprazole (PRILOSEC) 20 MG capsule TAKE ONE CAPSULE BY MOUTH EVERY DAY 90 capsule 1  . simvastatin (ZOCOR) 80 MG tablet TAKE 1 TABLET BY MOUTH EVERY DAY 30 tablet 0  . azithromycin (ZITHROMAX) 250 MG tablet 2 today then 1 a day for 4 days 6 tablet 0   No facility-administered medications prior to visit.     Review of Systems  Constitutional: Negative for chills, fatigue, fever, malaise/fatigue and weight loss.  HENT: Negative for ear discharge, ear pain, hoarse voice and sore throat.   Eyes: Negative for blurred vision.  Respiratory: Negative for cough, sputum production, choking,  shortness of breath and wheezing.   Cardiovascular: Negative for chest pain, palpitations, orthopnea, leg swelling and PND.  Gastrointestinal: Negative for abdominal pain, blood in stool, constipation, diarrhea, dysphagia, heartburn, melena and nausea.  Genitourinary: Negative for dysuria, frequency, hematuria and urgency.  Musculoskeletal: Negative for back pain, joint pain, myalgias, muscle weakness and neck pain.  Skin: Negative for rash.  Neurological: Positive for dizziness. Negative for tingling, sensory change, focal weakness and headaches.  Endo/Heme/Allergies: Negative for environmental allergies and polydipsia. Does not bruise/bleed easily.  Psychiatric/Behavioral: Negative for depression and suicidal ideas. The patient is nervous/anxious. The patient does not have insomnia.      Objective  Vitals:   03/01/17 0824  BP: 120/80  Pulse: 68  Weight: 159 lb (72.1 kg)  Height: 5\' 4"  (1.626 m)    Physical Exam  Constitutional: She is well-developed, well-nourished, and in no distress. No distress.  HENT:  Head: Normocephalic and atraumatic.  Right Ear: Tympanic membrane, external ear and ear canal normal.  Left Ear: Tympanic membrane, external ear and ear canal normal.  Nose: Nose normal.  No mucosal edema.  Mouth/Throat: Oropharynx is clear and moist. No oropharyngeal exudate, posterior oropharyngeal edema or posterior oropharyngeal erythema.  Eyes: Conjunctivae and EOM are normal. Pupils are equal, round, and reactive to light. Right eye exhibits no discharge. Left eye exhibits no discharge.  Neck: Normal range of motion. Neck supple. No JVD present. No thyromegaly present.  Cardiovascular: Normal rate, regular rhythm, S1 normal, S2 normal, normal heart sounds and intact distal pulses. Exam reveals no gallop, no S3, no S4 and no friction rub.  No murmur heard. Pulmonary/Chest: Effort normal and breath sounds normal. She has no wheezes. She has no rales.  Abdominal: Soft. Bowel  sounds are normal. She exhibits no mass. There is no tenderness. There is no guarding.  Musculoskeletal: Normal range of motion. She exhibits no edema.  Lymphadenopathy:    She has no cervical adenopathy.  Neurological: She is alert. She has normal reflexes.  Skin: Skin is warm and dry. She is not diaphoretic.  Psychiatric: Mood and affect normal.  Nursing note and vitals reviewed.     Assessment & Plan  Problem List Items Addressed This Visit      Cardiovascular and Mediastinum   Essential hypertension - Primary   Relevant Medications   hydrochlorothiazide (MICROZIDE) 12.5 MG capsule   simvastatin (ZOCOR) 80 MG tablet   gemfibrozil (LOPID) 600 MG tablet   metoprolol tartrate (LOPRESSOR) 50 MG tablet   Other Relevant Orders   Renal function panel     Digestive   Gastroesophageal reflux disease without esophagitis   Relevant Medications   meclizine (ANTIVERT) 25 MG tablet   omeprazole (PRILOSEC) 20 MG capsule     Other   Mixed hyperlipidemia   Relevant Medications   hydrochlorothiazide (MICROZIDE) 12.5 MG capsule   simvastatin (ZOCOR) 80 MG tablet   gemfibrozil (LOPID) 600 MG tablet   metoprolol tartrate (LOPRESSOR) 50 MG tablet   Other Relevant Orders   Lipid panel   Dizziness   Relevant Medications   meclizine (ANTIVERT) 25 MG tablet   Overweight (BMI 25.0-29.9)    Other Visit Diagnoses    Acute anxiety       Relevant Medications   ALPRAZolam (XANAX) 0.25 MG tablet   Labyrinthitis, unspecified laterality       Relevant Medications   meclizine (ANTIVERT) 25 MG tablet   Need for 23-polyvalent pneumococcal polysaccharide vaccine       Relevant Orders   Pneumococcal polysaccharide vaccine 23-valent greater than or equal to 2yo subcutaneous/IM (Completed)   Menopause       Relevant Orders   DG Bone Density      Meds ordered this encounter  Medications  . ALPRAZolam (XANAX) 0.25 MG tablet    Sig: Take 1 tablet (0.25 mg total) by mouth 1 day or 1 dose.     Dispense:  30 tablet    Refill:  1  . meclizine (ANTIVERT) 25 MG tablet    Sig: Take 1 tablet (25 mg total) by mouth 3 (three) times daily as needed for dizziness.    Dispense:  30 tablet    Refill:  2  . hydrochlorothiazide (MICROZIDE) 12.5 MG capsule    Sig: Take 1 capsule (12.5 mg total) by mouth daily.    Dispense:  90 capsule    Refill:  1  . omeprazole (PRILOSEC) 20 MG capsule    Sig: Take 1 capsule (20 mg total) by mouth daily.    Dispense:  90 capsule    Refill:  1  . simvastatin (  ZOCOR) 80 MG tablet    Sig: Take 1 tablet (80 mg total) by mouth daily.    Dispense:  90 tablet    Refill:  1  . gemfibrozil (LOPID) 600 MG tablet    Sig: 1 teice a day    Dispense:  180 tablet    Refill:  1  . metoprolol tartrate (LOPRESSOR) 50 MG tablet    Sig: Take 1 tablet (50 mg total) by mouth 2 (two) times daily.    Dispense:  180 tablet    Refill:  1      Dr. Otilio Miu [facility]  03/01/17

## 2017-03-01 NOTE — Patient Instructions (Signed)

## 2017-03-02 LAB — RENAL FUNCTION PANEL
ALBUMIN: 4.8 g/dL (ref 3.5–4.8)
BUN / CREAT RATIO: 19 (ref 12–28)
BUN: 15 mg/dL (ref 8–27)
CALCIUM: 9.8 mg/dL (ref 8.7–10.3)
CO2: 25 mmol/L (ref 20–29)
CREATININE: 0.8 mg/dL (ref 0.57–1.00)
Chloride: 97 mmol/L (ref 96–106)
GFR calc Af Amer: 82 mL/min/{1.73_m2} (ref >59)
GFR calc non Af Amer: 71 mL/min/{1.73_m2} (ref >59)
Glucose: 113 mg/dL — ABNORMAL HIGH (ref 65–99)
PHOSPHORUS: 2.7 mg/dL (ref 2.5–4.5)
Potassium: 4.1 mmol/L (ref 3.5–5.2)
Sodium: 140 mmol/L (ref 134–144)

## 2017-03-02 LAB — LIPID PANEL
CHOLESTEROL TOTAL: 187 mg/dL (ref 100–199)
Chol/HDL Ratio: 3.7 ratio (ref 0.0–4.4)
HDL: 50 mg/dL (ref >39)
LDL CALC: 115 mg/dL — AB (ref 0–99)
TRIGLYCERIDES: 110 mg/dL (ref 0–149)
VLDL CHOLESTEROL CAL: 22 mg/dL (ref 5–40)

## 2017-03-08 ENCOUNTER — Ambulatory Visit
Admission: RE | Admit: 2017-03-08 | Discharge: 2017-03-08 | Disposition: A | Discharge status: Home / Self Care | Payer: Medicare HMO | Source: Ambulatory Visit | Attending: Family Medicine | Admitting: Family Medicine

## 2017-03-08 DIAGNOSIS — Z780 Asymptomatic menopausal state: Secondary | ICD-10-CM | POA: Insufficient documentation

## 2017-03-08 DIAGNOSIS — M85851 Other specified disorders of bone density and structure, right thigh: Secondary | ICD-10-CM | POA: Diagnosis not present

## 2017-03-15 DIAGNOSIS — Z872 Personal history of diseases of the skin and subcutaneous tissue: Secondary | ICD-10-CM | POA: Diagnosis not present

## 2017-03-15 DIAGNOSIS — Z85820 Personal history of malignant melanoma of skin: Secondary | ICD-10-CM | POA: Diagnosis not present

## 2017-03-15 DIAGNOSIS — L821 Other seborrheic keratosis: Secondary | ICD-10-CM | POA: Diagnosis not present

## 2017-03-15 DIAGNOSIS — L918 Other hypertrophic disorders of the skin: Secondary | ICD-10-CM | POA: Diagnosis not present

## 2017-03-15 DIAGNOSIS — L570 Actinic keratosis: Secondary | ICD-10-CM | POA: Diagnosis not present

## 2017-03-15 DIAGNOSIS — Z859 Personal history of malignant neoplasm, unspecified: Secondary | ICD-10-CM | POA: Diagnosis not present

## 2017-03-15 DIAGNOSIS — Z86018 Personal history of other benign neoplasm: Secondary | ICD-10-CM | POA: Diagnosis not present

## 2017-03-15 DIAGNOSIS — L578 Other skin changes due to chronic exposure to nonionizing radiation: Secondary | ICD-10-CM | POA: Diagnosis not present

## 2017-03-16 ENCOUNTER — Other Ambulatory Visit: Payer: Self-pay

## 2017-03-16 DIAGNOSIS — J309 Allergic rhinitis, unspecified: Secondary | ICD-10-CM

## 2017-03-16 MED ORDER — CETIRIZINE HCL 10 MG PO TABS: 10.0000 mg | ORAL_TABLET | Freq: Every day | ORAL | 11 refills | Status: DC

## 2017-08-10 ENCOUNTER — Ambulatory Visit (INDEPENDENT_AMBULATORY_CARE_PROVIDER_SITE_OTHER): Payer: Medicare HMO | Admitting: Family Medicine

## 2017-08-10 ENCOUNTER — Encounter: Payer: Self-pay | Admitting: Family Medicine

## 2017-08-10 VITALS — BP 120/70 | HR 76 | Ht 64.0 in | Wt 149.0 lb

## 2017-08-10 DIAGNOSIS — I10 Essential (primary) hypertension: Secondary | ICD-10-CM

## 2017-08-10 DIAGNOSIS — K219 Gastro-esophageal reflux disease without esophagitis: Secondary | ICD-10-CM | POA: Diagnosis not present

## 2017-08-10 DIAGNOSIS — R69 Illness, unspecified: Secondary | ICD-10-CM | POA: Diagnosis not present

## 2017-08-10 DIAGNOSIS — E782 Mixed hyperlipidemia: Secondary | ICD-10-CM | POA: Diagnosis not present

## 2017-08-10 DIAGNOSIS — F419 Anxiety disorder, unspecified: Secondary | ICD-10-CM | POA: Diagnosis not present

## 2017-08-10 DIAGNOSIS — N3090 Cystitis, unspecified without hematuria: Secondary | ICD-10-CM | POA: Diagnosis not present

## 2017-08-10 LAB — POCT URINALYSIS DIPSTICK
Bilirubin, UA: NEGATIVE
GLUCOSE UA: NEGATIVE
KETONES UA: NEGATIVE
Leukocytes, UA: NEGATIVE
NITRITE UA: POSITIVE
PROTEIN UA: NEGATIVE
SPEC GRAV UA: 1.02 (ref 1.010–1.025)
Urobilinogen, UA: 0.2 E.U./dL
pH, UA: 6 (ref 5.0–8.0)

## 2017-08-10 MED ORDER — OMEPRAZOLE 20 MG PO CPDR: 20.0000 mg | DELAYED_RELEASE_CAPSULE | Freq: Every day | ORAL | 1 refills | Status: DC

## 2017-08-10 MED ORDER — GEMFIBROZIL 600 MG PO TABS
ORAL_TABLET | ORAL | 1 refills | Status: DC
Start: 2017-08-10 — End: 2018-01-28

## 2017-08-10 MED ORDER — SIMVASTATIN 80 MG PO TABS: 80.0000 mg | ORAL_TABLET | Freq: Every day | ORAL | 1 refills | Status: DC

## 2017-08-10 MED ORDER — METOPROLOL TARTRATE 50 MG PO TABS: 50.0000 mg | ORAL_TABLET | Freq: Two times a day (BID) | ORAL | 1 refills | Status: DC

## 2017-08-10 MED ORDER — HYDROCHLOROTHIAZIDE 12.5 MG PO CAPS: 12.5000 mg | ORAL_CAPSULE | Freq: Every day | ORAL | 1 refills | Status: DC

## 2017-08-10 MED ORDER — ALPRAZOLAM 0.25 MG PO TABS: 0.2500 mg | ORAL_TABLET | ORAL | 1 refills | Status: DC

## 2017-08-10 MED ORDER — CEPHALEXIN 500 MG PO CAPS: 500.0000 mg | ORAL_CAPSULE | Freq: Two times a day (BID) | ORAL | 0 refills | Status: DC

## 2017-08-10 NOTE — Assessment & Plan Note (Signed)
Chronic Controlled Contintinue prilosec 20 mg daily.

## 2017-08-10 NOTE — Assessment & Plan Note (Signed)
Episodic anxiety. Controlled on alprazolam 0.25 prn

## 2017-08-10 NOTE — Assessment & Plan Note (Signed)
Chronic. Controlled Continue hctz 12.5 mg daily and metoprolol 50 mg bid.

## 2017-08-10 NOTE — Assessment & Plan Note (Signed)
Chronic Controlled Continue Gemfibrizol 600 bid and simvastatin daily.

## 2017-08-10 NOTE — Progress Notes (Signed)
Name: Amber Galloway   MRN: [id number]    DOB: 06-12-1940   Date:08/10/2017       Progress Note  Subjective  Chief Complaint  Chief Complaint  Patient presents with  . Hypertension  . Hyperlipidemia  . Gastroesophageal Reflux  . Anxiety    doesn't need med right now  . Urinary Tract Infection    lower back pain    Hypertension  This is a chronic problem. The current episode started more than 1 year ago. The problem is unchanged. The problem is controlled. Pertinent negatives include no anxiety, blurred vision, chest pain, headaches, malaise/fatigue, neck pain, orthopnea, palpitations, peripheral edema, PND, shortness of breath or sweats. There are no associated agents to hypertension. There are no known risk factors for coronary artery disease. Past treatments include diuretics and beta blockers. The current treatment provides moderate improvement. There are no compliance problems.  There is no history of angina, kidney disease, CAD/MI, CVA, heart failure, left ventricular hypertrophy, PVD or retinopathy. There is no history of chronic renal disease, a hypertension causing med or renovascular disease.  Hyperlipidemia  This is a chronic problem. The current episode started more than 1 year ago. The problem is controlled. Recent lipid tests were reviewed and are normal. She has no history of chronic renal disease. There are no known factors aggravating her hyperlipidemia. Pertinent negatives include no chest pain, focal sensory loss, focal weakness, leg pain, myalgias or shortness of breath. Current antihyperlipidemic treatment includes statins. The current treatment provides mild improvement of lipids. There are no compliance problems.  Risk factors for coronary artery disease include post-menopausal, hypertension, diabetes mellitus and dyslipidemia.  Gastroesophageal Reflux  She reports no abdominal pain, no belching, no chest pain, no choking, no coughing, no dysphagia, no early satiety,  no heartburn, no hoarse voice, no nausea, no sore throat or no wheezing. This is a chronic problem. The current episode started more than 1 year ago. The problem occurs rarely. Nothing aggravates the symptoms. Pertinent negatives include no anemia, fatigue, melena, muscle weakness, orthopnea or weight loss. She has tried a PPI for the symptoms. The treatment provided moderate relief.  Anxiety  Presents for follow-up visit. Symptoms include excessive worry and nervous/anxious behavior. Patient reports no chest pain, compulsions, confusion, depressed mood, dizziness, dry mouth, insomnia, irritability, malaise, muscle tension, nausea, obsessions, palpitations, panic, restlessness, shortness of breath or suicidal ideas. Symptoms occur occasionally. The severity of symptoms is moderate.    Urinary Tract Infection   This is a new problem. The current episode started 1 to 4 weeks ago. The problem has been waxing and waning. The quality of the pain is described as aching. There has been no fever. Associated symptoms include frequency. Pertinent negatives include no chills, discharge, flank pain, hematuria, hesitancy, nausea, sweats, urgency or vomiting. She has tried nothing for the symptoms. There is no history of recurrent UTIs.    Essential hypertension Chronic. Controlled Continue hctz 12.5 mg daily and metoprolol 50 mg bid.   Gastroesophageal reflux disease without esophagitis Chronic Controlled Contintinue prilosec 20 mg daily.   Mixed hyperlipidemia Chronic Controlled Continue Gemfibrizol 600 bid and simvastatin daily.  Acute anxiety Episodic anxiety. Controlled on alprazolam 0.25 prn   Past Medical History:  Diagnosis Date  . Breast cancer ([place]) 08/2012   rt mastectomy  . GERD (gastroesophageal reflux disease)   . Hyperlipidemia   . Hypertension   . Melanoma [name])     Past Surgical History:  Procedure Laterality Date  . APPENDECTOMY    .  CHOLECYSTECTOMY    . COLONOSCOPY  2014    cleared  . MASTECTOMY Right 08/2012  . VAGINAL HYSTERECTOMY      Family History  Problem Relation Age of Onset  . Breast cancer Sister 61  . Breast cancer Maternal Aunt 80    Social History   Socioeconomic History  . Marital status: Widowed    Spouse name: Not on file  . Number of children: Not on file  . Years of education: Not on file  . Highest education level: Not on file  Occupational History  . Not on file  Social Needs  . Financial resource strain: Not on file  . Food insecurity:    Worry: Not on file    Inability: Not on file  . Transportation needs:    Medical: Not on file    Non-medical: Not on file  Tobacco Use  . Smoking status: Never Smoker  . Smokeless tobacco: Never Used  Substance and Sexual Activity  . Alcohol use: No    Alcohol/week: 0.0 oz  . Drug use: No  . Sexual activity: Never  Lifestyle  . Physical activity:    Days per week: Not on file    Minutes per session: Not on file  . Stress: Not on file  Relationships  . Social connections:    Talks on phone: Not on file    Gets together: Not on file    Attends religious service: Not on file    Active member of club or organization: Not on file    Attends meetings of clubs or organizations: Not on file    Relationship status: Not on file  . Intimate partner violence:    Fear of current or ex partner: Not on file    Emotionally abused: Not on file    Physically abused: Not on file    Forced sexual activity: Not on file  Other Topics Concern  . Not on file  Social History Narrative  . Not on file    No Known Allergies  Outpatient Medications Prior to Visit  Medication Sig Dispense Refill  . aspirin 325 MG tablet Take 325 mg by mouth daily.    . cetirizine (ZYRTEC) 10 MG tablet Take 1 tablet (10 mg total) by mouth daily. 30 tablet 11  . latanoprost (XALATAN) 0.005 % ophthalmic solution Place 1 drop into both eyes at bedtime.  6  . meclizine (ANTIVERT) 25 MG tablet Take 1 tablet (25 mg  total) by mouth 3 (three) times daily as needed for dizziness. 30 tablet 2  . ALPRAZolam (XANAX) 0.25 MG tablet Take 1 tablet (0.25 mg total) by mouth 1 day or 1 dose. 30 tablet 1  . gemfibrozil (LOPID) 600 MG tablet 1 teice a day 180 tablet 1  . hydrochlorothiazide (MICROZIDE) 12.5 MG capsule Take 1 capsule (12.5 mg total) by mouth daily. 90 capsule 1  . metoprolol tartrate (LOPRESSOR) 50 MG tablet Take 1 tablet (50 mg total) by mouth 2 (two) times daily. 180 tablet 1  . omeprazole (PRILOSEC) 20 MG capsule Take 1 capsule (20 mg total) by mouth daily. 90 capsule 1  . simvastatin (ZOCOR) 80 MG tablet Take 1 tablet (80 mg total) by mouth daily. 90 tablet 1   No facility-administered medications prior to visit.     Review of Systems  Constitutional: Negative for chills, fatigue, fever, irritability, malaise/fatigue and weight loss.  HENT: Negative for ear discharge, ear pain, hoarse voice and sore throat.   Eyes: Negative for  blurred vision.  Respiratory: Negative for cough, sputum production, choking, shortness of breath and wheezing.   Cardiovascular: Negative for chest pain, palpitations, orthopnea, leg swelling and PND.  Gastrointestinal: Negative for abdominal pain, blood in stool, constipation, diarrhea, dysphagia, heartburn, melena, nausea and vomiting.  Genitourinary: Positive for frequency. Negative for dysuria, flank pain, hematuria, hesitancy and urgency.  Musculoskeletal: Negative for back pain, joint pain, myalgias, muscle weakness and neck pain.  Skin: Negative for rash.  Neurological: Negative for dizziness, tingling, sensory change, focal weakness and headaches.  Endo/Heme/Allergies: Negative for environmental allergies and polydipsia. Does not bruise/bleed easily.  Psychiatric/Behavioral: Negative for confusion, depression and suicidal ideas. The patient is nervous/anxious. The patient does not have insomnia.      Objective  Vitals:   08/10/17 0800  BP: 120/70  Pulse: 76   Weight: 149 lb (67.6 kg)  Height: 5\' 4"  (1.626 m)    Physical Exam  Constitutional: No distress.  HENT:  Head: Normocephalic and atraumatic.  Right Ear: External ear normal.  Left Ear: External ear normal.  Nose: Nose normal.  Mouth/Throat: Oropharynx is clear and moist.  Eyes: Pupils are equal, round, and reactive to light. Conjunctivae and EOM are normal. Right eye exhibits no discharge. Left eye exhibits no discharge.  Neck: Normal range of motion. Neck supple. No JVD present. No thyromegaly present.  Cardiovascular: Normal rate, regular rhythm, normal heart sounds and intact distal pulses. Exam reveals no gallop and no friction rub.  No murmur heard. Pulmonary/Chest: Effort normal and breath sounds normal.  Abdominal: Soft. Bowel sounds are normal. She exhibits no mass. There is no tenderness. There is no guarding.  Musculoskeletal: Normal range of motion. She exhibits no edema.  Lymphadenopathy:    She has no cervical adenopathy.  Neurological: She is alert. She has normal reflexes.  Skin: Skin is warm and dry. She is not diaphoretic.  Nursing note and vitals reviewed.     Assessment & Plan  Problem List Items Addressed This Visit      Cardiovascular and Mediastinum   Essential hypertension - Primary    Chronic. Controlled Continue hctz 12.5 mg daily and metoprolol 50 mg bid.       Relevant Medications   hydrochlorothiazide (MICROZIDE) 12.5 MG capsule   metoprolol tartrate (LOPRESSOR) 50 MG tablet   gemfibrozil (LOPID) 600 MG tablet   simvastatin (ZOCOR) 80 MG tablet     Digestive   Gastroesophageal reflux disease without esophagitis    Chronic Controlled Contintinue prilosec 20 mg daily.       Relevant Medications   omeprazole (PRILOSEC) 20 MG capsule     Other   Mixed hyperlipidemia    Chronic Controlled Continue Gemfibrizol 600 bid and simvastatin daily.      Relevant Medications   hydrochlorothiazide (MICROZIDE) 12.5 MG capsule   metoprolol  tartrate (LOPRESSOR) 50 MG tablet   gemfibrozil (LOPID) 600 MG tablet   simvastatin (ZOCOR) 80 MG tablet   Acute anxiety    Episodic anxiety. Controlled on alprazolam 0.25 prn      Relevant Medications   ALPRAZolam (XANAX) 0.25 MG tablet    Other Visit Diagnoses    Cystitis       Acute Symtomatic Initiate cephalexin 500 bid for 3 days.   Relevant Medications   cephALEXin (KEFLEX) 500 MG capsule   Other Relevant Orders   POCT urinalysis dipstick (Completed)      Meds ordered this encounter  Medications  . ALPRAZolam (XANAX) 0.25 MG tablet    Sig: Take  1 tablet (0.25 mg total) by mouth 1 day or 1 dose.    Dispense:  30 tablet    Refill:  1  . hydrochlorothiazide (MICROZIDE) 12.5 MG capsule    Sig: Take 1 capsule (12.5 mg total) by mouth daily.    Dispense:  90 capsule    Refill:  1  . metoprolol tartrate (LOPRESSOR) 50 MG tablet    Sig: Take 1 tablet (50 mg total) by mouth 2 (two) times daily.    Dispense:  180 tablet    Refill:  1  . omeprazole (PRILOSEC) 20 MG capsule    Sig: Take 1 capsule (20 mg total) by mouth daily.    Dispense:  90 capsule    Refill:  1  . gemfibrozil (LOPID) 600 MG tablet    Sig: 1 teice a day    Dispense:  180 tablet    Refill:  1  . simvastatin (ZOCOR) 80 MG tablet    Sig: Take 1 tablet (80 mg total) by mouth daily.    Dispense:  90 tablet    Refill:  1  . cephALEXin (KEFLEX) 500 MG capsule    Sig: Take 1 capsule (500 mg total) by mouth 2 (two) times daily.    Dispense:  6 capsule    Refill:  0      Dr. Otilio Miu [facility]  08/10/17

## 2017-09-14 ENCOUNTER — Ambulatory Visit (INDEPENDENT_AMBULATORY_CARE_PROVIDER_SITE_OTHER): Payer: Medicare HMO

## 2017-09-14 DIAGNOSIS — R319 Hematuria, unspecified: Secondary | ICD-10-CM | POA: Diagnosis not present

## 2017-09-14 DIAGNOSIS — R69 Illness, unspecified: Secondary | ICD-10-CM

## 2017-09-14 DIAGNOSIS — Z23 Encounter for immunization: Secondary | ICD-10-CM | POA: Diagnosis not present

## 2017-09-14 DIAGNOSIS — I10 Essential (primary) hypertension: Secondary | ICD-10-CM | POA: Diagnosis not present

## 2017-09-14 DIAGNOSIS — E782 Mixed hyperlipidemia: Secondary | ICD-10-CM

## 2017-09-14 DIAGNOSIS — Z1231 Encounter for screening mammogram for malignant neoplasm of breast: Secondary | ICD-10-CM

## 2017-09-14 LAB — POCT URINALYSIS DIPSTICK
BILIRUBIN UA: NEGATIVE
Blood, UA: NEGATIVE
GLUCOSE UA: NEGATIVE
Ketones, UA: NEGATIVE
Leukocytes, UA: NEGATIVE
Nitrite, UA: NEGATIVE
Protein, UA: NEGATIVE
SPEC GRAV UA: 1.01 (ref 1.010–1.025)
Urobilinogen, UA: 0.2 E.U./dL
pH, UA: 5 (ref 5.0–8.0)

## 2017-09-15 LAB — HEPATIC FUNCTION PANEL
ALT: 13 IU/L (ref 0–32)
AST: 21 IU/L (ref 0–40)
Alkaline Phosphatase: 69 IU/L (ref 39–117)
BILIRUBIN TOTAL: 0.6 mg/dL (ref 0.0–1.2)
BILIRUBIN, DIRECT: 0.2 mg/dL (ref 0.00–0.40)
TOTAL PROTEIN: 7.2 g/dL (ref 6.0–8.5)

## 2017-09-15 LAB — RENAL FUNCTION PANEL
ALBUMIN: 4.9 g/dL — AB (ref 3.5–4.8)
BUN/Creatinine Ratio: 16 (ref 12–28)
BUN: 12 mg/dL (ref 8–27)
CO2: 24 mmol/L (ref 20–29)
Calcium: 9.9 mg/dL (ref 8.7–10.3)
Chloride: 98 mmol/L (ref 96–106)
Creatinine, Ser: 0.77 mg/dL (ref 0.57–1.00)
GFR calc Af Amer: 85 mL/min/{1.73_m2} (ref >59)
GFR calc non Af Amer: 74 mL/min/{1.73_m2} (ref >59)
GLUCOSE: 148 mg/dL — AB (ref 65–99)
POTASSIUM: 4.5 mmol/L (ref 3.5–5.2)
Phosphorus: 3.1 mg/dL (ref 2.5–4.5)
SODIUM: 142 mmol/L (ref 134–144)

## 2017-09-15 LAB — LIPID PANEL WITH LDL/HDL RATIO
Cholesterol, Total: 189 mg/dL (ref 100–199)
HDL: 49 mg/dL (ref >39)
LDL CALC: 108 mg/dL — AB (ref 0–99)
LDl/HDL Ratio: 2.2 ratio (ref 0.0–3.2)
Triglycerides: 159 mg/dL — ABNORMAL HIGH (ref 0–149)
VLDL CHOLESTEROL CAL: 32 mg/dL (ref 5–40)

## 2017-09-16 ENCOUNTER — Telehealth: Payer: Self-pay

## 2017-09-16 ENCOUNTER — Other Ambulatory Visit: Payer: Self-pay

## 2017-09-16 MED ORDER — CYCLOBENZAPRINE HCL 5 MG PO TABS: 5.0000 mg | ORAL_TABLET | Freq: Two times a day (BID) | ORAL | 0 refills | Status: DC

## 2017-09-16 NOTE — Telephone Encounter (Signed)
Pt called wanting muscle relaxer for back called in. Sent cyclobenzaprine in

## 2017-09-16 NOTE — Telephone Encounter (Signed)
Spoke to pt on phone- she wanted muscle relaxer sent in for back stiffness

## 2017-09-20 ENCOUNTER — Ambulatory Visit
Admission: RE | Admit: 2017-09-20 | Discharge: 2017-09-20 | Disposition: A | Discharge status: Home / Self Care | Payer: Medicare HMO | Source: Ambulatory Visit | Attending: Family Medicine | Admitting: Family Medicine

## 2017-09-20 ENCOUNTER — Other Ambulatory Visit: Payer: Medicare HMO

## 2017-09-20 ENCOUNTER — Ambulatory Visit (INDEPENDENT_AMBULATORY_CARE_PROVIDER_SITE_OTHER): Payer: Medicare HMO | Admitting: Family Medicine

## 2017-09-20 ENCOUNTER — Encounter: Payer: Self-pay | Admitting: Family Medicine

## 2017-09-20 VITALS — BP 134/60 | HR 60 | Ht 64.0 in | Wt 149.0 lb

## 2017-09-20 DIAGNOSIS — R739 Hyperglycemia, unspecified: Secondary | ICD-10-CM

## 2017-09-20 DIAGNOSIS — M545 Low back pain: Secondary | ICD-10-CM | POA: Insufficient documentation

## 2017-09-20 DIAGNOSIS — M5136 Other intervertebral disc degeneration, lumbar region: Secondary | ICD-10-CM | POA: Diagnosis not present

## 2017-09-20 DIAGNOSIS — W19XXXA Unspecified fall, initial encounter: Secondary | ICD-10-CM | POA: Diagnosis not present

## 2017-09-20 DIAGNOSIS — M5441 Lumbago with sciatica, right side: Secondary | ICD-10-CM

## 2017-09-20 DIAGNOSIS — S3992XA Unspecified injury of lower back, initial encounter: Secondary | ICD-10-CM | POA: Diagnosis not present

## 2017-09-20 MED ORDER — MELOXICAM 7.5 MG PO TABS: 7.5000 mg | ORAL_TABLET | Freq: Every day | ORAL | 0 refills | Status: DC

## 2017-09-20 NOTE — Progress Notes (Signed)
Name: Amber Galloway   MRN: [id number]    DOB: [birth year]   Date:09/20/2017       Progress Note  Subjective  Chief Complaint  Chief Complaint  Patient presents with  . Back Pain    feel x 3 weeks ago getting out of car. Fell on R) side of buttocks. Is now having hip pain- tried cyclobenzaprine over the weekend- didn't help. Pain is burning and going down R) leg    Back Pain  This is a new problem. The current episode started 1 to 4 weeks ago (3 weeks ago). The problem occurs constantly. The problem has been waxing and waning since onset. The pain is present in the lumbar spine. The quality of the pain is described as aching. The pain radiates to the right thigh (buttock). The pain is at a severity of 5/10. The pain is moderate. The symptoms are aggravated by bending and twisting (sit to stand). Associated symptoms include leg pain. Pertinent negatives include no abdominal pain, bladder incontinence, bowel incontinence, chest pain, dysuria, fever, headaches, numbness, paresis, paresthesias, pelvic pain, perianal numbness, tingling, weakness or weight loss. Risk factors include history of cancer. She has tried muscle relaxant and NSAIDs (acetomenophen) for the symptoms. The treatment provided no relief.  Fall  The accident occurred more than 1 week ago. Fall occurred: while geeting out of car. She fell from a height of 1 to 2 ft. She landed on grass. There was no blood loss. The point of impact was the buttocks. The pain is present in the right hip, right upper leg, buttocks and back. The pain is at a severity of 6/10. The pain is moderate. Pertinent negatives include no abdominal pain, bowel incontinence, fever, headaches, hearing loss, hematuria, loss of consciousness, nausea, numbness, tingling, visual change or vomiting. She has tried acetaminophen and NSAID for the symptoms. The treatment provided no relief.    No problem-specific Assessment & Plan notes found for this encounter.   Past  Medical History:  Diagnosis Date  . Breast cancer ([place]) 08/2012   rt mastectomy  . GERD (gastroesophageal reflux disease)   . Hyperlipidemia   . Hypertension   . Melanoma [facility])     Past Surgical History:  Procedure Laterality Date  . APPENDECTOMY    . CHOLECYSTECTOMY    . COLONOSCOPY  2014   cleared  . MASTECTOMY Right 08/2012  . VAGINAL HYSTERECTOMY      Family History  Problem Relation Age of Onset  . Breast cancer Sister 60  . Breast cancer Maternal Aunt 80    Social History   Socioeconomic History  . Marital status: Widowed    Spouse name: Not on file  . Number of children: Not on file  . Years of education: Not on file  . Highest education level: Not on file  Occupational History  . Not on file  Social Needs  . Financial resource strain: Not on file  . Food insecurity:    Worry: Not on file    Inability: Not on file  . Transportation needs:    Medical: Not on file    Non-medical: Not on file  Tobacco Use  . Smoking status: Never Smoker  . Smokeless tobacco: Never Used  Substance and Sexual Activity  . Alcohol use: No    Alcohol/week: 0.0 standard drinks  . Drug use: No  . Sexual activity: Never  Lifestyle  . Physical activity:    Days per week: Not on file    Minutes  per session: Not on file  . Stress: Not on file  Relationships  . Social connections:    Talks on phone: Not on file    Gets together: Not on file    Attends religious service: Not on file    Active member of club or organization: Not on file    Attends meetings of clubs or organizations: Not on file    Relationship status: Not on file  . Intimate partner violence:    Fear of current or ex partner: Not on file    Emotionally abused: Not on file    Physically abused: Not on file    Forced sexual activity: Not on file  Other Topics Concern  . Not on file  Social History Narrative  . Not on file    No Known Allergies  Outpatient Medications Prior to Visit  Medication Sig  Dispense Refill  . ALPRAZolam (XANAX) 0.25 MG tablet Take 1 tablet (0.25 mg total) by mouth 1 day or 1 dose. 30 tablet 1  . aspirin 325 MG tablet Take 325 mg by mouth daily.    . cetirizine (ZYRTEC) 10 MG tablet Take 1 tablet (10 mg total) by mouth daily. 30 tablet 11  . cyclobenzaprine (FLEXERIL) 5 MG tablet Take 1 tablet (5 mg total) by mouth 2 (two) times daily. 30 tablet 0  . gemfibrozil (LOPID) 600 MG tablet 1 teice a day 180 tablet 1  . hydrochlorothiazide (MICROZIDE) 12.5 MG capsule Take 1 capsule (12.5 mg total) by mouth daily. 90 capsule 1  . latanoprost (XALATAN) 0.005 % ophthalmic solution Place 1 drop into both eyes at bedtime.  6  . meclizine (ANTIVERT) 25 MG tablet Take 1 tablet (25 mg total) by mouth 3 (three) times daily as needed for dizziness. 30 tablet 2  . metoprolol tartrate (LOPRESSOR) 50 MG tablet Take 1 tablet (50 mg total) by mouth 2 (two) times daily. 180 tablet 1  . omeprazole (PRILOSEC) 20 MG capsule Take 1 capsule (20 mg total) by mouth daily. 90 capsule 1  . simvastatin (ZOCOR) 80 MG tablet Take 1 tablet (80 mg total) by mouth daily. 90 tablet 1  . cephALEXin (KEFLEX) 500 MG capsule Take 1 capsule (500 mg total) by mouth 2 (two) times daily. 6 capsule 0   No facility-administered medications prior to visit.     Review of Systems  Constitutional: Negative for chills, fever, malaise/fatigue and weight loss.  HENT: Negative for ear discharge, ear pain and sore throat.   Eyes: Negative for blurred vision.  Respiratory: Negative for cough, sputum production, shortness of breath and wheezing.   Cardiovascular: Negative for chest pain, palpitations and leg swelling.  Gastrointestinal: Negative for abdominal pain, blood in stool, bowel incontinence, constipation, diarrhea, heartburn, melena, nausea and vomiting.  Genitourinary: Negative for bladder incontinence, dysuria, frequency, hematuria, pelvic pain and urgency.  Musculoskeletal: Positive for back pain. Negative  for joint pain, myalgias and neck pain.  Skin: Negative for rash.  Neurological: Negative for dizziness, tingling, sensory change, focal weakness, loss of consciousness, weakness, numbness, headaches and paresthesias.  Endo/Heme/Allergies: Negative for environmental allergies and polydipsia. Does not bruise/bleed easily.  Psychiatric/Behavioral: Negative for depression and suicidal ideas. The patient is not nervous/anxious and does not have insomnia.      Objective  Vitals:   09/20/17 0850  BP: 134/60  Pulse: 60  Weight: 149 lb (67.6 kg)  Height: 5\' 4"  (1.626 m)    Physical Exam  Constitutional: She is oriented to person, place, and  time. She appears well-developed and well-nourished.  HENT:  Head: Normocephalic.  Right Ear: External ear normal.  Left Ear: External ear normal.  Mouth/Throat: Oropharynx is clear and moist.  Eyes: Pupils are equal, round, and reactive to light. Conjunctivae and EOM are normal. Lids are everted and swept, no foreign bodies found. Left eye exhibits no hordeolum. No foreign body present in the left eye. Right conjunctiva is not injected. Left conjunctiva is not injected. No scleral icterus.  Neck: Normal range of motion. Neck supple. No JVD present. No tracheal deviation present. No thyromegaly present.  Cardiovascular: Normal rate, regular rhythm, normal heart sounds and intact distal pulses. Exam reveals no gallop and no friction rub.  No murmur heard. Pulmonary/Chest: Effort normal and breath sounds normal. No respiratory distress. She has no wheezes. She has no rales.  Abdominal: Soft. Bowel sounds are normal. She exhibits no mass. There is no hepatosplenomegaly. There is no tenderness. There is no rebound and no guarding.  Musculoskeletal: She exhibits no edema.       Right hip: She exhibits normal range of motion, normal strength, no tenderness, no bony tenderness, no swelling, no crepitus and no deformity.       Lumbar back: She exhibits deformity  and spasm. She exhibits no tenderness and no bony tenderness.       Back:  Mild scoliosis/kyphosis  Lymphadenopathy:    She has no cervical adenopathy.  Neurological: She is alert and oriented to person, place, and time. She has normal strength. She displays normal reflexes. No cranial nerve deficit or sensory deficit.  Reflex Scores:      Tricep reflexes are 2+ on the right side and 2+ on the left side.      Bicep reflexes are 2+ on the right side and 2+ on the left side.      Brachioradialis reflexes are 2+ on the right side and 2+ on the left side.      Patellar reflexes are 2+ on the right side and 2+ on the left side.      Achilles reflexes are 2+ on the right side and 2+ on the left side. Positive SLE right leg  Skin: Skin is warm. No rash noted.  Psychiatric: She has a normal mood and affect. Her mood appears not anxious. She does not exhibit a depressed mood.  Nursing note and vitals reviewed.     Assessment & Plan  Problem List Items Addressed This Visit    None    Visit Diagnoses    Fall, initial encounter    -  Primary   Recent fall car level seat due to wet grass. Will obtain lumbar xray to rule out compreesion fx.   Relevant Medications   meloxicam (MOBIC) 7.5 MG tablet   Other Relevant Orders   DG Lumbar Spine Complete   Acute right-sided low back pain with right-sided sciatica       Persistent episodic lumbar back pain. Continue cyclobenzaprine q hs and meloxicam 7.5 mg daily.    Relevant Medications   meloxicam (MOBIC) 7.5 MG tablet      Meds ordered this encounter  Medications  . meloxicam (MOBIC) 7.5 MG tablet    Sig: Take 1 tablet (7.5 mg total) by mouth daily.    Dispense:  30 tablet    Refill:  0      Dr. Deanna Jones [facility]  09/20/17

## 2017-09-20 NOTE — Patient Instructions (Signed)
Herniated Disk A herniated disk is when a disk in your spine bulges out too far. There is a disk with a spongy center in between each pair of bones in the spine (vertebrae). These disks act as shock absorbers when you move. A herniated disk can cause pain and muscle weakness. This can happen anywhere in the back or neck. Follow these instructions at home: Medicines  Take over-the-counter and prescription medicines only as told by your doctor.  Do not drive or use heavy machinery while taking prescription pain medicine. Activity  Rest as told by your doctor.  After your rest period: ? Return to your normal activities. Slowly start exercising as told by your doctor. Ask what activities are safe for you. ? Use good posture. ? Avoid movements that cause pain. ? Do not lift anything that is heavier than 10 lb (4.5 kg) until your doctor says this is safe. ? Do not sit or stand for a long time without moving. ? Do not sit for a long time without getting up and moving around.  Do exercises (physical therapy) as told.  Try to strengthen your back and belly (abdomen) with exercises like crunches, swimming, or walking. General instructions  Do not use any products that contain nicotine or tobacco, such as cigarettes and e-cigarettes. If you need help quitting, ask your doctor.  Do not wear high-heeled shoes.  Do not sleep on your belly.  If you are overweight, work with your doctor to lose weight safely.  To prevent or treat constipation while you are taking prescription pain medicine, your doctor may recommend that you: ? Drink enough fluid to keep your pee (urine) clear or pale yellow. ? Take over-the-counter or prescription medicines. ? Eat foods that are high in fiber. These include fresh fruits and vegetables, whole grains, and beans. ? Limit foods that are high in fat and processed sugars. These include fried and sweet foods.  Keep all follow-up visits as told by your doctor. This  is important. How is this prevented?  Stay at a healthy weight.  Try to avoid stress.  Stay in shape. Do at least 150 minutes of moderate-intensity exercise each week, such as fast walking or water aerobics.  When lifting objects: ? Keep your feet as far apart as your shoulders (shoulder-width apart) or farther apart. ? Tighten your belly muscles. ? Bend your knees and hips and keep your spine neutral. Lift using the strength of your legs, not your back. Do not lock your knees straight out. ? Always ask for help to lift heavy or awkward objects. Contact a doctor if:  You have back pain or neck pain that does not get better after 6 weeks.  You have very bad pain.  You get any of these problems in any part of your body: ? Tingling. ? Weakness. ? Loss of feeling (numbness). Get help right away if:  You cannot move your arms or legs.  You cannot control when you pee (urinate) or poop (have a bowel movement).  You feel dizzy.  You faint.  You have trouble breathing. This information is not intended to replace advice given to you by your health care provider. Make sure you discuss any questions you have with your health care provider. Document Released: 05/15/2013 Document Revised: 08/28/2015 Document Reviewed: 06/27/2015 Elsevier Interactive Patient Education  2017 [employer]  

## 2017-09-21 LAB — HEMOGLOBIN A1C
Est. average glucose Bld gHb Est-mCnc: 131 mg/dL
Hgb A1c MFr Bld: 6.2 % — ABNORMAL HIGH (ref 4.8–5.6)

## 2017-10-11 ENCOUNTER — Ambulatory Visit: Payer: Medicare HMO

## 2017-10-11 ENCOUNTER — Ambulatory Visit
Admission: RE | Admit: 2017-10-11 | Discharge: 2017-10-11 | Disposition: A | Discharge status: Home / Self Care | Payer: Medicare HMO | Source: Ambulatory Visit | Attending: Family Medicine | Admitting: Family Medicine

## 2017-10-11 ENCOUNTER — Encounter (INDEPENDENT_AMBULATORY_CARE_PROVIDER_SITE_OTHER): Payer: Self-pay

## 2017-10-11 DIAGNOSIS — Z1231 Encounter for screening mammogram for malignant neoplasm of breast: Secondary | ICD-10-CM

## 2017-10-13 ENCOUNTER — Other Ambulatory Visit: Payer: Self-pay | Admitting: Family Medicine

## 2017-10-13 DIAGNOSIS — W19XXXA Unspecified fall, initial encounter: Secondary | ICD-10-CM

## 2017-10-13 DIAGNOSIS — M5441 Lumbago with sciatica, right side: Secondary | ICD-10-CM

## 2017-11-16 DIAGNOSIS — H25013 Cortical age-related cataract, bilateral: Secondary | ICD-10-CM | POA: Diagnosis not present

## 2017-12-02 DIAGNOSIS — Z833 Family history of diabetes mellitus: Secondary | ICD-10-CM | POA: Diagnosis not present

## 2017-12-02 DIAGNOSIS — Z7982 Long term (current) use of aspirin: Secondary | ICD-10-CM | POA: Diagnosis not present

## 2017-12-02 DIAGNOSIS — Z8249 Family history of ischemic heart disease and other diseases of the circulatory system: Secondary | ICD-10-CM | POA: Diagnosis not present

## 2017-12-02 DIAGNOSIS — I10 Essential (primary) hypertension: Secondary | ICD-10-CM | POA: Diagnosis not present

## 2017-12-02 DIAGNOSIS — Z823 Family history of stroke: Secondary | ICD-10-CM | POA: Diagnosis not present

## 2017-12-02 DIAGNOSIS — E785 Hyperlipidemia, unspecified: Secondary | ICD-10-CM | POA: Diagnosis not present

## 2017-12-02 DIAGNOSIS — I252 Old myocardial infarction: Secondary | ICD-10-CM | POA: Diagnosis not present

## 2017-12-02 DIAGNOSIS — K219 Gastro-esophageal reflux disease without esophagitis: Secondary | ICD-10-CM | POA: Diagnosis not present

## 2017-12-02 DIAGNOSIS — R69 Illness, unspecified: Secondary | ICD-10-CM | POA: Diagnosis not present

## 2017-12-02 DIAGNOSIS — Z803 Family history of malignant neoplasm of breast: Secondary | ICD-10-CM | POA: Diagnosis not present

## 2018-01-28 ENCOUNTER — Other Ambulatory Visit: Payer: Self-pay | Admitting: Family Medicine

## 2018-01-28 DIAGNOSIS — E782 Mixed hyperlipidemia: Secondary | ICD-10-CM

## 2018-02-02 ENCOUNTER — Other Ambulatory Visit: Payer: Self-pay | Admitting: Family Medicine

## 2018-02-02 DIAGNOSIS — I10 Essential (primary) hypertension: Secondary | ICD-10-CM

## 2018-02-07 ENCOUNTER — Other Ambulatory Visit: Payer: Medicare HMO

## 2018-02-07 DIAGNOSIS — R7303 Prediabetes: Secondary | ICD-10-CM

## 2018-02-07 DIAGNOSIS — E782 Mixed hyperlipidemia: Secondary | ICD-10-CM | POA: Diagnosis not present

## 2018-02-07 DIAGNOSIS — I10 Essential (primary) hypertension: Secondary | ICD-10-CM | POA: Diagnosis not present

## 2018-02-07 DIAGNOSIS — R69 Illness, unspecified: Secondary | ICD-10-CM

## 2018-02-08 LAB — RENAL FUNCTION PANEL
ALBUMIN: 4.7 g/dL (ref 3.7–4.7)
BUN/Creatinine Ratio: 15 (ref 12–28)
BUN: 11 mg/dL (ref 8–27)
CHLORIDE: 100 mmol/L (ref 96–106)
CO2: 24 mmol/L (ref 20–29)
Calcium: 10.1 mg/dL (ref 8.7–10.3)
Creatinine, Ser: 0.75 mg/dL (ref 0.57–1.00)
GFR calc Af Amer: 88 mL/min/{1.73_m2} (ref >59)
GFR calc non Af Amer: 76 mL/min/{1.73_m2} (ref >59)
GLUCOSE: 116 mg/dL — AB (ref 65–99)
PHOSPHORUS: 3.3 mg/dL (ref 3.0–4.3)
POTASSIUM: 3.8 mmol/L (ref 3.5–5.2)
Sodium: 140 mmol/L (ref 134–144)

## 2018-02-08 LAB — HEPATIC FUNCTION PANEL
ALK PHOS: 82 IU/L (ref 39–117)
ALT: 12 IU/L (ref 0–32)
AST: 25 IU/L (ref 0–40)
Bilirubin Total: 0.6 mg/dL (ref 0.0–1.2)
Bilirubin, Direct: 0.21 mg/dL (ref 0.00–0.40)
TOTAL PROTEIN: 7.3 g/dL (ref 6.0–8.5)

## 2018-02-08 LAB — LIPID PANEL WITH LDL/HDL RATIO
Cholesterol, Total: 202 mg/dL — ABNORMAL HIGH (ref 100–199)
HDL: 56 mg/dL (ref >39)
LDL Calculated: 119 mg/dL — ABNORMAL HIGH (ref 0–99)
LDl/HDL Ratio: 2.1 ratio (ref 0.0–3.2)
TRIGLYCERIDES: 133 mg/dL (ref 0–149)
VLDL CHOLESTEROL CAL: 27 mg/dL (ref 5–40)

## 2018-02-08 LAB — HEMOGLOBIN A1C
Est. average glucose Bld gHb Est-mCnc: 123 mg/dL
Hgb A1c MFr Bld: 5.9 % — ABNORMAL HIGH (ref 4.8–5.6)

## 2018-02-08 LAB — MICROALBUMIN, URINE: MICROALBUM., U, RANDOM: 37.1 ug/mL

## 2018-02-14 ENCOUNTER — Encounter: Payer: Self-pay | Admitting: Family Medicine

## 2018-02-14 ENCOUNTER — Ambulatory Visit (INDEPENDENT_AMBULATORY_CARE_PROVIDER_SITE_OTHER): Payer: Medicare HMO | Admitting: Family Medicine

## 2018-02-14 DIAGNOSIS — E782 Mixed hyperlipidemia: Secondary | ICD-10-CM | POA: Diagnosis not present

## 2018-02-14 DIAGNOSIS — H8309 Labyrinthitis, unspecified ear: Secondary | ICD-10-CM | POA: Diagnosis not present

## 2018-02-14 DIAGNOSIS — R69 Illness, unspecified: Secondary | ICD-10-CM | POA: Diagnosis not present

## 2018-02-14 DIAGNOSIS — K219 Gastro-esophageal reflux disease without esophagitis: Secondary | ICD-10-CM | POA: Diagnosis not present

## 2018-02-14 DIAGNOSIS — I10 Essential (primary) hypertension: Secondary | ICD-10-CM | POA: Diagnosis not present

## 2018-02-14 DIAGNOSIS — F419 Anxiety disorder, unspecified: Secondary | ICD-10-CM | POA: Diagnosis not present

## 2018-02-14 DIAGNOSIS — R42 Dizziness and giddiness: Secondary | ICD-10-CM | POA: Diagnosis not present

## 2018-02-14 MED ORDER — MECLIZINE HCL 25 MG PO TABS: 25.0000 mg | ORAL_TABLET | Freq: Three times a day (TID) | ORAL | 2 refills | Status: DC | PRN

## 2018-02-14 MED ORDER — METOPROLOL TARTRATE 50 MG PO TABS: 50.0000 mg | ORAL_TABLET | Freq: Two times a day (BID) | ORAL | 1 refills | Status: DC

## 2018-02-14 MED ORDER — CYCLOBENZAPRINE HCL 5 MG PO TABS: 5.0000 mg | ORAL_TABLET | Freq: Two times a day (BID) | ORAL | 0 refills | Status: DC

## 2018-02-14 MED ORDER — SIMVASTATIN 80 MG PO TABS: 80.0000 mg | ORAL_TABLET | Freq: Every day | ORAL | 1 refills | Status: DC

## 2018-02-14 MED ORDER — GEMFIBROZIL 600 MG PO TABS: 600.0000 mg | ORAL_TABLET | Freq: Two times a day (BID) | ORAL | 1 refills | Status: DC

## 2018-02-14 MED ORDER — ALPRAZOLAM 0.25 MG PO TABS: 0.2500 mg | ORAL_TABLET | ORAL | 1 refills | Status: DC

## 2018-02-14 MED ORDER — OMEPRAZOLE 20 MG PO CPDR: 20.0000 mg | DELAYED_RELEASE_CAPSULE | Freq: Every day | ORAL | 1 refills | Status: DC

## 2018-02-14 MED ORDER — HYDROCHLOROTHIAZIDE 12.5 MG PO CAPS: ORAL_CAPSULE | ORAL | 1 refills | Status: DC

## 2018-02-14 NOTE — Progress Notes (Signed)
Date:  02/14/2018   Name:  Amber Galloway   DOB:  January 22, 1940   MRN:  [id number]   Chief Complaint: Hypertension; Hyperlipidemia; Gastroesophageal Reflux; and Spasms (refill flexeril)  Hypertension  This is a chronic problem. The current episode started more than 1 year ago. The problem is unchanged. The problem is controlled. Pertinent negatives include no anxiety, blurred vision, chest pain, headaches, malaise/fatigue, neck pain, orthopnea, palpitations, peripheral edema, PND, shortness of breath or sweats. There are no associated agents to hypertension. There are no known risk factors for coronary artery disease. Past treatments include diuretics and beta blockers. The current treatment provides moderate improvement. There are no compliance problems.  There is no history of angina, kidney disease, CAD/MI, CVA, heart failure, left ventricular hypertrophy, PVD or retinopathy. There is no history of chronic renal disease, a hypertension causing med or renovascular disease.  Hyperlipidemia  This is a chronic problem. The current episode started more than 1 year ago. The problem is controlled. Recent lipid tests were reviewed and are normal. She has no history of chronic renal disease, diabetes, hypothyroidism, liver disease, obesity or nephrotic syndrome. Factors aggravating her hyperlipidemia include thiazides. Pertinent negatives include no chest pain, focal sensory loss, focal weakness, leg pain, myalgias or shortness of breath. The current treatment provides moderate improvement of lipids. There are no compliance problems.  Risk factors for coronary artery disease include dyslipidemia, diabetes mellitus and hypertension.  Gastroesophageal Reflux  She reports no abdominal pain, no belching, no chest pain, no choking, no coughing, no dysphagia, no early satiety, no globus sensation, no heartburn, no hoarse voice, no nausea, no sore throat, no stridor, no tooth decay or no wheezing. This is a  chronic problem. The problem occurs rarely. The problem has been waxing and waning. Nothing aggravates the symptoms. Pertinent negatives include no anemia, fatigue, melena, muscle weakness, orthopnea or weight loss. There are no known risk factors. She has tried a PPI for the symptoms.    Review of Systems  Constitutional: Negative.  Negative for chills, fatigue, fever, malaise/fatigue, unexpected weight change and weight loss.  HENT: Negative for congestion, ear discharge, ear pain, hoarse voice, rhinorrhea, sinus pressure, sneezing and sore throat.   Eyes: Negative for blurred vision, photophobia, pain, discharge, redness and itching.  Respiratory: Negative for cough, choking, shortness of breath, wheezing and stridor.   Cardiovascular: Negative for chest pain, palpitations, orthopnea and PND.  Gastrointestinal: Negative for abdominal pain, blood in stool, constipation, diarrhea, dysphagia, heartburn, melena, nausea and vomiting.  Endocrine: Negative for cold intolerance, heat intolerance, polydipsia, polyphagia and polyuria.  Genitourinary: Negative for dysuria, flank pain, frequency, hematuria, menstrual problem, pelvic pain, urgency, vaginal bleeding and vaginal discharge.  Musculoskeletal: Negative for arthralgias, back pain, myalgias, muscle weakness and neck pain.  Skin: Negative for rash.  Allergic/Immunologic: Negative for environmental allergies and food allergies.  Neurological: Negative for dizziness, focal weakness, weakness, light-headedness, numbness and headaches.  Hematological: Negative for adenopathy. Does not bruise/bleed easily.  Psychiatric/Behavioral: Negative for dysphoric mood. The patient is not nervous/anxious.     Patient Active Problem List   Diagnosis Date Noted  . Acute anxiety 08/10/2017  . Overweight (BMI 25.0-29.9) 03/01/2017  . Dizziness 06/05/2016  . Essential hypertension 06/06/2015  . Mixed hyperlipidemia 06/06/2015  . Gastroesophageal reflux disease  without esophagitis 06/06/2015    No Known Allergies  Past Surgical History:  Procedure Laterality Date  . APPENDECTOMY    . CHOLECYSTECTOMY    . COLONOSCOPY  2014   cleared  .  MASTECTOMY Right 08/2012  . VAGINAL HYSTERECTOMY      Social History   Tobacco Use  . Smoking status: Never Smoker  . Smokeless tobacco: Never Used  Substance Use Topics  . Alcohol use: No    Alcohol/week: 0.0 standard drinks  . Drug use: No     Medication list has been reviewed and updated.  Current Meds  Medication Sig  . ALPRAZolam (XANAX) 0.25 MG tablet Take 1 tablet (0.25 mg total) by mouth 1 day or 1 dose.  Marland Kitchen aspirin 325 MG tablet Take 325 mg by mouth daily.  . cetirizine (ZYRTEC) 10 MG tablet Take 1 tablet (10 mg total) by mouth daily.  Marland Kitchen gemfibrozil (LOPID) 600 MG tablet TAKE 1 TABLET BY MOUTH TWICE A DAY (Patient taking differently: Take 600 mg by mouth. TAKE 1 TABLET BY MOUTH TWICE A DAY)  . hydrochlorothiazide (MICROZIDE) 12.5 MG capsule TAKE 1 CAPSULE BY MOUTH EVERY DAY  . latanoprost (XALATAN) 0.005 % ophthalmic solution Place 1 drop into both eyes at bedtime.  . meclizine (ANTIVERT) 25 MG tablet Take 1 tablet (25 mg total) by mouth 3 (three) times daily as needed for dizziness.  . metoprolol tartrate (LOPRESSOR) 50 MG tablet Take 1 tablet (50 mg total) by mouth 2 (two) times daily.  Marland Kitchen omeprazole (PRILOSEC) 20 MG capsule Take 1 capsule (20 mg total) by mouth daily.  . simvastatin (ZOCOR) 80 MG tablet Take 1 tablet (80 mg total) by mouth daily.  . [DISCONTINUED] cyclobenzaprine (FLEXERIL) 5 MG tablet Take 1 tablet (5 mg total) by mouth 2 (two) times daily.    PHQ 2/9 Scores 01/08/2017 06/06/2015 02/19/2015 11/05/2014  PHQ - 2 Score 0 0 0 0  PHQ- 9 Score 0 - - -    Physical Exam Vitals signs and nursing note reviewed.  Constitutional:      General: She is not in acute distress.    Appearance: She is not diaphoretic.  HENT:     Head: Normocephalic and atraumatic.     Right Ear:  External ear normal.     Left Ear: External ear normal.     Nose: Nose normal.  Eyes:     General:        Right eye: No discharge.        Left eye: No discharge.     Conjunctiva/sclera: Conjunctivae normal.     Pupils: Pupils are equal, round, and reactive to light.  Neck:     Musculoskeletal: Normal range of motion and neck supple.     Thyroid: No thyromegaly.     Vascular: No JVD.  Cardiovascular:     Rate and Rhythm: Normal rate and regular rhythm.     Chest Wall: PMI is not displaced. No thrill.     Pulses: Normal pulses.          Carotid pulses are 2+ on the right side and 2+ on the left side.      Radial pulses are 2+ on the right side and 2+ on the left side.       Femoral pulses are 2+ on the right side and 2+ on the left side.      Popliteal pulses are 2+ on the right side and 2+ on the left side.       Dorsalis pedis pulses are 2+ on the right side and 2+ on the left side.       Posterior tibial pulses are 2+ on the right side and 2+ on the left  side.     Heart sounds: Normal heart sounds, S1 normal and S2 normal. No murmur. No systolic murmur. No diastolic murmur. No friction rub. No gallop. No S3 or S4 sounds.   Pulmonary:     Effort: Pulmonary effort is normal.     Breath sounds: Normal breath sounds.  Abdominal:     General: Bowel sounds are normal.     Palpations: Abdomen is soft. There is no mass.     Tenderness: There is no abdominal tenderness. There is no guarding.  Musculoskeletal: Normal range of motion.     Right lower leg: No edema.     Left lower leg: No edema.  Lymphadenopathy:     Cervical: No cervical adenopathy.  Skin:    General: Skin is warm and dry.  Neurological:     Mental Status: She is alert.     Deep Tendon Reflexes: Reflexes are normal and symmetric.     BP 124/80   Pulse 60   Ht 5\' 4"  (1.626 m)   Wt 143 lb (64.9 kg)   BMI 24.55 kg/m   Assessment and Plan:

## 2018-03-03 ENCOUNTER — Other Ambulatory Visit: Payer: Self-pay

## 2018-03-03 ENCOUNTER — Encounter: Payer: Self-pay | Admitting: Family Medicine

## 2018-03-03 ENCOUNTER — Ambulatory Visit (INDEPENDENT_AMBULATORY_CARE_PROVIDER_SITE_OTHER): Payer: Medicare HMO | Admitting: Family Medicine

## 2018-03-03 VITALS — BP 121/81 | HR 77 | Resp 16 | Ht 64.0 in | Wt 143.0 lb

## 2018-03-03 DIAGNOSIS — N952 Postmenopausal atrophic vaginitis: Secondary | ICD-10-CM

## 2018-03-03 DIAGNOSIS — B373 Candidiasis of vulva and vagina: Secondary | ICD-10-CM

## 2018-03-03 DIAGNOSIS — B3731 Acute candidiasis of vulva and vagina: Secondary | ICD-10-CM

## 2018-03-03 MED ORDER — FLUCONAZOLE 150 MG PO TABS: 150.0000 mg | ORAL_TABLET | Freq: Once | ORAL | 0 refills | Status: DC

## 2018-03-03 MED ORDER — ESTRADIOL 0.1 MG/GM VA CREA: 1.0000 | TOPICAL_CREAM | Freq: Every day | VAGINAL | 12 refills | Status: DC

## 2018-03-03 NOTE — Progress Notes (Signed)
Date:  03/03/2018   Name:  Amber Galloway   DOB:  1941-01-11   MRN:  [id number]   Chief Complaint: Vaginal Itching (2/3 visit it started having discomfort. Now worried due to hx of different cancers. )  Vaginal Itching  The patient's pertinent negatives include no genital itching, genital lesions, genital odor, genital rash, missed menses, pelvic pain, vaginal bleeding or vaginal discharge. Primary symptoms comment: irratation. This is a new problem. The current episode started more than 1 month ago. The problem occurs daily. The pain is mild. Pertinent negatives include no abdominal pain, anorexia, back pain, chills, constipation, diarrhea, discolored urine, dysuria, fever, flank pain, frequency, headaches, hematuria, joint pain, joint swelling, nausea, painful intercourse, rash, sore throat, urgency or vomiting. Exacerbated by: urination. She is sexually active. She is postmenopausal.    Review of Systems  Constitutional: Negative.  Negative for chills, fatigue, fever and unexpected weight change.  HENT: Negative for congestion, ear discharge, ear pain, rhinorrhea, sinus pressure, sneezing and sore throat.   Eyes: Negative for photophobia, pain, discharge, redness and itching.  Respiratory: Negative for cough, shortness of breath, wheezing and stridor.   Gastrointestinal: Negative for abdominal pain, anorexia, blood in stool, constipation, diarrhea, nausea and vomiting.  Endocrine: Negative for cold intolerance, heat intolerance, polydipsia, polyphagia and polyuria.  Genitourinary: Negative for dysuria, flank pain, frequency, hematuria, menstrual problem, missed menses, pelvic pain, urgency, vaginal bleeding and vaginal discharge.  Musculoskeletal: Negative for arthralgias, back pain, joint pain and myalgias.  Skin: Negative for rash.  Allergic/Immunologic: Negative for environmental allergies and food allergies.  Neurological: Negative for dizziness, weakness, light-headedness,  numbness and headaches.  Hematological: Negative for adenopathy. Does not bruise/bleed easily.  Psychiatric/Behavioral: Negative for dysphoric mood. The patient is not nervous/anxious.     Patient Active Problem List   Diagnosis Date Noted  . Acute anxiety 08/10/2017  . Overweight (BMI 25.0-29.9) 03/01/2017  . Dizziness 06/05/2016  . Essential hypertension 06/06/2015  . Mixed hyperlipidemia 06/06/2015  . Gastroesophageal reflux disease without esophagitis 06/06/2015    No Known Allergies  Past Surgical History:  Procedure Laterality Date  . APPENDECTOMY    . CHOLECYSTECTOMY    . COLONOSCOPY  2014   cleared  . MASTECTOMY Right 08/2012  . VAGINAL HYSTERECTOMY      Social History   Tobacco Use  . Smoking status: Never Smoker  . Smokeless tobacco: Never Used  Substance Use Topics  . Alcohol use: No    Alcohol/week: 0.0 standard drinks  . Drug use: No     Medication list has been reviewed and updated.  Current Meds  Medication Sig  . ALPRAZolam (XANAX) 0.25 MG tablet Take 1 tablet (0.25 mg total) by mouth 1 day or 1 dose.  Marland Kitchen aspirin EC 81 MG tablet Take 81 mg by mouth daily.  . cetirizine (ZYRTEC) 10 MG tablet Take 1 tablet (10 mg total) by mouth daily.  . cyclobenzaprine (FLEXERIL) 5 MG tablet Take 1 tablet (5 mg total) by mouth 2 (two) times daily.  Marland Kitchen gemfibrozil (LOPID) 600 MG tablet Take 1 tablet (600 mg total) by mouth 2 (two) times daily before a meal. TAKE 1 TABLET BY MOUTH TWICE A DAY  . hydrochlorothiazide (MICROZIDE) 12.5 MG capsule TAKE 1 CAPSULE BY MOUTH EVERY DAY  . latanoprost (XALATAN) 0.005 % ophthalmic solution Place 1 drop into both eyes at bedtime.  . meclizine (ANTIVERT) 25 MG tablet Take 1 tablet (25 mg total) by mouth 3 (three) times daily as needed for  dizziness.  . metoprolol tartrate (LOPRESSOR) 50 MG tablet Take 1 tablet (50 mg total) by mouth 2 (two) times daily.  Marland Kitchen omeprazole (PRILOSEC) 20 MG capsule Take 1 capsule (20 mg total) by mouth  daily.  . simvastatin (ZOCOR) 80 MG tablet Take 1 tablet (80 mg total) by mouth daily.  . [DISCONTINUED] aspirin 325 MG tablet Take 325 mg by mouth daily.  . [DISCONTINUED] meloxicam (MOBIC) 7.5 MG tablet TAKE 1 TABLET BY MOUTH EVERY DAY    PHQ 2/9 Scores 01/08/2017 06/06/2015 02/19/2015 11/05/2014  PHQ - 2 Score 0 0 0 0  PHQ- 9 Score 0 - - -    Physical Exam Vitals signs and nursing note reviewed.  Constitutional:      General: She is not in acute distress.    Appearance: She is not diaphoretic.  HENT:     Head: Normocephalic and atraumatic.     Right Ear: External ear normal.     Left Ear: External ear normal.     Nose: Nose normal.  Eyes:     General:        Right eye: No discharge.        Left eye: No discharge.     Conjunctiva/sclera: Conjunctivae normal.     Pupils: Pupils are equal, round, and reactive to light.  Neck:     Musculoskeletal: Normal range of motion and neck supple.     Thyroid: No thyromegaly.     Vascular: No JVD.  Cardiovascular:     Rate and Rhythm: Normal rate and regular rhythm.     Heart sounds: Normal heart sounds. No murmur. No friction rub. No gallop.   Pulmonary:     Effort: Pulmonary effort is normal.     Breath sounds: Normal breath sounds.  Abdominal:     General: Bowel sounds are normal.     Palpations: Abdomen is soft. There is no mass.     Tenderness: There is no abdominal tenderness. There is no right CVA tenderness, left CVA tenderness, guarding or rebound.     Hernia: There is no hernia in the umbilical area, ventral area, right inguinal area or left inguinal area.  Genitourinary:    Pubic Area: No rash.      Labia:        Right: No rash, tenderness, lesion or injury.        Left: Rash present. No tenderness, lesion or injury.      Urethra: No prolapse, urethral pain, urethral swelling or urethral lesion.     Comments: Mild erythema Musculoskeletal: Normal range of motion.  Lymphadenopathy:     Cervical: No cervical adenopathy.       Lower Body: No right inguinal adenopathy.  Skin:    General: Skin is warm and dry.  Neurological:     Mental Status: She is alert.     Deep Tendon Reflexes: Reflexes are normal and symmetric.     BP 121/81   Pulse 77   Resp 16   Ht 5\' 4"  (1.626 m)   Wt 143 lb (64.9 kg)   SpO2 97%   BMI 24.55 kg/m   Assessment and Plan: 1. Vulvovaginal candidiasis Patient has a "irritation in the vulvar area that has been unresolved with self-medication with Neosporin and steroid cream.  Is likely secondary to the diagnosis.  Will initiate with fluconazole 150 mg 1 dose. - fluconazole (DIFLUCAN) 150 MG tablet; Take 1 tablet (150 mg total) by mouth once for 1 dose.  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  Exam notes to have vulvar atrophy.  Will begin estradiol cream vaginal cream mostly to be used in the perivulvar area only. - estradiol (ESTRACE) 0.1 MG/GM vaginal cream; Place 1 Applicatorful vaginally at bedtime.  Dispense: 42.5 g; Refill: 12

## 2018-03-07 MED ORDER — FLUCONAZOLE 150 MG PO TABS: 150.0000 mg | ORAL_TABLET | Freq: Once | ORAL | 0 refills | Status: AC

## 2018-03-07 NOTE — Addendum Note (Signed)
Addended by: Fredderick Severance on: 03/07/2018 10:32 AM   Modules accepted: Orders

## 2018-03-22 DIAGNOSIS — H40023 Open angle with borderline findings, high risk, bilateral: Secondary | ICD-10-CM | POA: Diagnosis not present

## 2018-05-06 ENCOUNTER — Ambulatory Visit (INDEPENDENT_AMBULATORY_CARE_PROVIDER_SITE_OTHER): Payer: Medicare HMO | Admitting: Family Medicine

## 2018-05-06 ENCOUNTER — Other Ambulatory Visit: Payer: Self-pay

## 2018-05-06 ENCOUNTER — Encounter: Payer: Self-pay | Admitting: Family Medicine

## 2018-05-06 ENCOUNTER — Ambulatory Visit: Payer: Medicare HMO | Admitting: Family Medicine

## 2018-05-06 VITALS — BP 120/80 | HR 84 | Ht 64.0 in | Wt 143.0 lb

## 2018-05-06 DIAGNOSIS — M5137 Other intervertebral disc degeneration, lumbosacral region: Secondary | ICD-10-CM

## 2018-05-06 DIAGNOSIS — M5136 Other intervertebral disc degeneration, lumbar region: Secondary | ICD-10-CM | POA: Diagnosis not present

## 2018-05-06 MED ORDER — MELOXICAM 7.5 MG PO TABS: 7.5000 mg | ORAL_TABLET | Freq: Every day | ORAL | 0 refills | Status: DC

## 2018-05-06 MED ORDER — CYCLOBENZAPRINE HCL 10 MG PO TABS: 10.0000 mg | ORAL_TABLET | Freq: Three times a day (TID) | ORAL | 0 refills | Status: DC | PRN

## 2018-05-06 MED ORDER — PREDNISONE 10 MG PO TABS: ORAL_TABLET | ORAL | 1 refills | Status: DC

## 2018-05-06 NOTE — Patient Instructions (Signed)
Acute Back Pain, Adult  Acute back pain is sudden and usually short-lived. It is often caused by an injury to the muscles and tissues in the back. The injury may result from:   A muscle or ligament getting overstretched or torn (strained). Ligaments are tissues that connect bones to each other. Lifting something improperly can cause a back strain.   Wear and tear (degeneration) of the spinal disks. Spinal disks are circular tissue that provides cushioning between the bones of the spine (vertebrae).   Twisting motions, such as while playing sports or doing yard work.   A hit to the back.   Arthritis.  You may have a physical exam, lab tests, and imaging tests to find the cause of your pain. Acute back pain usually goes away with rest and home care.  Follow these instructions at home:  Managing pain, stiffness, and swelling   Take over-the-counter and prescription medicines only as told by your health care provider.   Your health care provider may recommend applying ice during the first 24-48 hours after your pain starts. To do this:  ? Put ice in a plastic bag.  ? Place a towel between your skin and the bag.  ? Leave the ice on for 20 minutes, 2-3 times a day.   If directed, apply heat to the affected area as often as told by your health care provider. Use the heat source that your health care provider recommends, such as a moist heat pack or a heating pad.  ? Place a towel between your skin and the heat source.  ? Leave the heat on for 20-30 minutes.  ? Remove the heat if your skin turns bright red. This is especially important if you are unable to feel pain, heat, or cold. You have a greater risk of getting burned.  Activity     Do not stay in bed. Staying in bed for more than 1-2 days can delay your recovery.   Sit up and stand up straight. Avoid leaning forward when you sit, or hunching over when you stand.  ? If you work at a desk, sit close to it so you do not need to lean over. Keep your chin tucked  in. Keep your neck drawn back, and keep your elbows bent at a right angle. Your arms should look like the letter "L."  ? Sit high and close to the steering wheel when you drive. Add lower back (lumbar) support to your car seat, if needed.   Take short walks on even surfaces as soon as you are able. Try to increase the length of time you walk each day.   Do not sit, drive, or stand in one place for more than 30 minutes at a time. Sitting or standing for long periods of time can put stress on your back.   Do not drive or use heavy machinery while taking prescription pain medicine.   Use proper lifting techniques. When you bend and lift, use positions that put less stress on your back:  ? Bend your knees.  ? Keep the load close to your body.  ? Avoid twisting.   Exercise regularly as told by your health care provider. Exercising helps your back heal faster and helps prevent back injuries by keeping muscles strong and flexible.   Work with a physical therapist to make a safe exercise program, as recommended by your health care provider. Do any exercises as told by your physical therapist.  Lifestyle   Maintain   a healthy weight. Extra weight puts stress on your back and makes it difficult to have good posture.   Avoid activities or situations that make you feel anxious or stressed. Stress and anxiety increase muscle tension and can make back pain worse. Learn ways to manage anxiety and stress, such as through exercise.  General instructions   Sleep on a firm mattress in a comfortable position. Try lying on your side with your knees slightly bent. If you lie on your back, put a pillow under your knees.   Follow your treatment plan as told by your health care provider. This may include:  ? Cognitive or behavioral therapy.  ? Acupuncture or massage therapy.  ? Meditation or yoga.  Contact a health care provider if:   You have pain that is not relieved with rest or medicine.   You have increasing pain going down  into your legs or buttocks.   Your pain does not improve after 2 weeks.   You have pain at night.   You lose weight without trying.   You have a fever or chills.  Get help right away if:   You develop new bowel or bladder control problems.   You have unusual weakness or numbness in your arms or legs.   You develop nausea or vomiting.   You develop abdominal pain.   You feel faint.  Summary   Acute back pain is sudden and usually short-lived.   Use proper lifting techniques. When you bend and lift, use positions that put less stress on your back.   Take over-the-counter and prescription medicines and apply heat or ice as directed by your health care provider.  This information is not intended to replace advice given to you by your health care provider. Make sure you discuss any questions you have with your health care provider.  Document Released: 12/29/2004 Document Revised: 08/05/2017 Document Reviewed: 08/12/2016  [employer]vier Interactive Patient Education  2019 [employer]

## 2018-05-06 NOTE — Progress Notes (Signed)
Date:  05/06/2018   Name:  Amber Galloway   DOB:  [birth year]   MRN:  [id number]   Chief Complaint: Back Pain (had a catch in her back in the lower left side yesterday- took a flexeril a nd it didn't helped. Felt like she couldn't put weight down on L) leg. )  Back Pain  This is a new problem. The current episode started yesterday. The problem occurs constantly. The problem has been waxing and waning since onset. The pain is present in the lumbar spine. The quality of the pain is described as aching. The pain radiates to the left thigh. The pain is at a severity of 9/10. The pain is severe. The pain is worse during the day. Associated symptoms include leg pain. Pertinent negatives include no abdominal pain, bladder incontinence, bowel incontinence, chest pain, dysuria, fever, headaches, numbness, paresis, paresthesias, pelvic pain, perianal numbness, tingling or weakness. She has tried muscle relaxant for the symptoms.    Review of Systems  Constitutional: Negative.  Negative for chills, fatigue, fever and unexpected weight change.  HENT: Negative for congestion, ear discharge, ear pain, rhinorrhea, sinus pressure, sneezing and sore throat.   Eyes: Negative for photophobia, pain, discharge, redness and itching.  Respiratory: Negative for cough, shortness of breath, wheezing and stridor.   Cardiovascular: Negative for chest pain.  Gastrointestinal: Negative for abdominal pain, blood in stool, bowel incontinence, constipation, diarrhea, nausea and vomiting.  Endocrine: Negative for cold intolerance, heat intolerance, polydipsia, polyphagia and polyuria.  Genitourinary: Negative for bladder incontinence, dysuria, flank pain, frequency, hematuria, menstrual problem, pelvic pain, urgency, vaginal bleeding and vaginal discharge.  Musculoskeletal: Positive for back pain. Negative for arthralgias and myalgias.  Skin: Negative for rash.  Allergic/Immunologic: Negative for environmental allergies  and food allergies.  Neurological: Negative for dizziness, tingling, weakness, light-headedness, numbness, headaches and paresthesias.  Hematological: Negative for adenopathy. Does not bruise/bleed easily.  Psychiatric/Behavioral: Negative for dysphoric mood. The patient is not nervous/anxious.     Patient Active Problem List   Diagnosis Date Noted  . Acute anxiety 08/10/2017  . Overweight (BMI 25.0-29.9) 03/01/2017  . Dizziness 06/05/2016  . Essential hypertension 06/06/2015  . Mixed hyperlipidemia 06/06/2015  . Gastroesophageal reflux disease without esophagitis 06/06/2015    No Known Allergies  Past Surgical History:  Procedure Laterality Date  . APPENDECTOMY    . CHOLECYSTECTOMY    . COLONOSCOPY  2014   cleared  . MASTECTOMY Right 08/2012  . VAGINAL HYSTERECTOMY      Social History   Tobacco Use  . Smoking status: Never Smoker  . Smokeless tobacco: Never Used  Substance Use Topics  . Alcohol use: No    Alcohol/week: 0.0 standard drinks  . Drug use: No     Medication list has been reviewed and updated.  Current Meds  Medication Sig  . ALPRAZolam (XANAX) 0.25 MG tablet Take 1 tablet (0.25 mg total) by mouth 1 day or 1 dose.  Marland Kitchen aspirin EC 81 MG tablet Take 81 mg by mouth daily.  . cetirizine (ZYRTEC) 10 MG tablet Take 1 tablet (10 mg total) by mouth daily.  . cyclobenzaprine (FLEXERIL) 5 MG tablet Take 1 tablet (5 mg total) by mouth 2 (two) times daily.  Marland Kitchen estradiol (ESTRACE) 0.1 MG/GM vaginal cream Place 1 Applicatorful vaginally at bedtime.  Marland Kitchen gemfibrozil (LOPID) 600 MG tablet Take 1 tablet (600 mg total) by mouth 2 (two) times daily before a meal. TAKE 1 TABLET BY MOUTH TWICE A DAY  .  hydrochlorothiazide (MICROZIDE) 12.5 MG capsule TAKE 1 CAPSULE BY MOUTH EVERY DAY  . latanoprost (XALATAN) 0.005 % ophthalmic solution Place 1 drop into both eyes at bedtime.  . meclizine (ANTIVERT) 25 MG tablet Take 1 tablet (25 mg total) by mouth 3 (three) times daily as needed  for dizziness.  . metoprolol tartrate (LOPRESSOR) 50 MG tablet Take 1 tablet (50 mg total) by mouth 2 (two) times daily.  Marland Kitchen omeprazole (PRILOSEC) 20 MG capsule Take 1 capsule (20 mg total) by mouth daily.  . simvastatin (ZOCOR) 80 MG tablet Take 1 tablet (80 mg total) by mouth daily.    PHQ 2/9 Scores 01/08/2017 06/06/2015 02/19/2015 11/05/2014  PHQ - 2 Score 0 0 0 0  PHQ- 9 Score 0 - - -    BP Readings from Last 3 Encounters:  05/06/18 120/80  03/03/18 121/81  02/14/18 124/80    Physical Exam Nursing note reviewed. Exam conducted with a chaperone present.  Constitutional:      General: She is not in acute distress.    Appearance: She is not diaphoretic.  HENT:     Head: Normocephalic and atraumatic.     Right Ear: External ear normal.     Left Ear: External ear normal.     Nose: Nose normal.  Eyes:     General:        Right eye: No discharge.        Left eye: No discharge.     Conjunctiva/sclera: Conjunctivae normal.     Pupils: Pupils are equal, round, and reactive to light.  Neck:     Musculoskeletal: Normal range of motion and neck supple.     Thyroid: No thyromegaly.     Vascular: No JVD.  Cardiovascular:     Rate and Rhythm: Normal rate and regular rhythm.     Heart sounds: Normal heart sounds. No murmur. No friction rub. No gallop.   Pulmonary:     Effort: Pulmonary effort is normal.     Breath sounds: Normal breath sounds.  Abdominal:     General: Bowel sounds are normal.     Palpations: Abdomen is soft. There is no mass.     Tenderness: There is no abdominal tenderness. There is no guarding.  Musculoskeletal: Normal range of motion.     Lumbar back: She exhibits spasm.  Lymphadenopathy:     Cervical: No cervical adenopathy.  Skin:    General: Skin is warm and dry.  Neurological:     Mental Status: She is alert.     Deep Tendon Reflexes: Reflexes are normal and symmetric.     Wt Readings from Last 3 Encounters:  05/06/18 143 lb (64.9 kg)  03/03/18 143  lb (64.9 kg)  02/14/18 143 lb (64.9 kg)    BP 120/80   Pulse 84   Ht 5\' 4"  (1.626 m)   Wt 143 lb (64.9 kg)   BMI 24.55 kg/m   Assessment and Plan: 1. Degenerative disc disease at L5-S1 level Patient has a previous x-ray that we reviewed which notes 2 areas of moderate to significant degenerative disc disease.  Patient has again lower back discomfort with radiation to the left leg.  We will initiate cyclobenzaprine 10 mg primarily at night.  Meloxicam 7.51 tablet a day and prednisone taper 40 mg over a 12-day.. - cyclobenzaprine (FLEXERIL) 10 MG tablet; Take 1 tablet (10 mg total) by mouth 3 (three) times daily as needed for muscle spasms.  Dispense: 30 tablet; Refill: 0 - meloxicam (  MOBIC) 7.5 MG tablet; Take 1 tablet (7.5 mg total) by mouth daily.  Dispense: 30 tablet; Refill: 0 - predniSONE (DELTASONE) 10 MG tablet; Taper 4,4,4,3,3,3,2,2,2,1,1,1,  Dispense: 45 tablet; Refill: 1

## 2018-05-29 ENCOUNTER — Other Ambulatory Visit: Payer: Self-pay | Admitting: Family Medicine

## 2018-05-29 DIAGNOSIS — M5136 Other intervertebral disc degeneration, lumbar region: Secondary | ICD-10-CM

## 2018-05-29 DIAGNOSIS — M5137 Other intervertebral disc degeneration, lumbosacral region: Secondary | ICD-10-CM

## 2018-06-24 ENCOUNTER — Other Ambulatory Visit: Payer: Self-pay | Admitting: Family Medicine

## 2018-06-24 DIAGNOSIS — M5137 Other intervertebral disc degeneration, lumbosacral region: Secondary | ICD-10-CM

## 2018-06-24 DIAGNOSIS — M5136 Other intervertebral disc degeneration, lumbar region: Secondary | ICD-10-CM

## 2018-08-15 ENCOUNTER — Ambulatory Visit: Payer: Medicare HMO | Admitting: Family Medicine

## 2018-08-19 ENCOUNTER — Ambulatory Visit (INDEPENDENT_AMBULATORY_CARE_PROVIDER_SITE_OTHER): Payer: Medicare HMO | Admitting: Family Medicine

## 2018-08-19 ENCOUNTER — Encounter: Payer: Self-pay | Admitting: Family Medicine

## 2018-08-19 ENCOUNTER — Other Ambulatory Visit: Payer: Self-pay

## 2018-08-19 VITALS — BP 128/70 | Ht 64.0 in | Wt 141.0 lb

## 2018-08-19 DIAGNOSIS — I10 Essential (primary) hypertension: Secondary | ICD-10-CM

## 2018-08-19 DIAGNOSIS — R7303 Prediabetes: Secondary | ICD-10-CM

## 2018-08-19 DIAGNOSIS — K219 Gastro-esophageal reflux disease without esophagitis: Secondary | ICD-10-CM

## 2018-08-19 DIAGNOSIS — Z23 Encounter for immunization: Secondary | ICD-10-CM | POA: Diagnosis not present

## 2018-08-19 DIAGNOSIS — E782 Mixed hyperlipidemia: Secondary | ICD-10-CM

## 2018-08-19 DIAGNOSIS — E663 Overweight: Secondary | ICD-10-CM

## 2018-08-19 DIAGNOSIS — F419 Anxiety disorder, unspecified: Secondary | ICD-10-CM

## 2018-08-19 DIAGNOSIS — R69 Illness, unspecified: Secondary | ICD-10-CM | POA: Diagnosis not present

## 2018-08-19 MED ORDER — GEMFIBROZIL 600 MG PO TABS: 600.0000 mg | ORAL_TABLET | Freq: Two times a day (BID) | ORAL | 1 refills | Status: DC

## 2018-08-19 MED ORDER — OMEPRAZOLE 20 MG PO CPDR: 20.0000 mg | DELAYED_RELEASE_CAPSULE | Freq: Every day | ORAL | 1 refills | Status: DC

## 2018-08-19 MED ORDER — SIMVASTATIN 80 MG PO TABS: 80.0000 mg | ORAL_TABLET | Freq: Every day | ORAL | 1 refills | Status: DC

## 2018-08-19 MED ORDER — METOPROLOL TARTRATE 50 MG PO TABS: 50.0000 mg | ORAL_TABLET | Freq: Two times a day (BID) | ORAL | 1 refills | Status: DC

## 2018-08-19 MED ORDER — HYDROCHLOROTHIAZIDE 12.5 MG PO CAPS: ORAL_CAPSULE | ORAL | 1 refills | Status: DC

## 2018-08-19 NOTE — Progress Notes (Signed)
Date:  08/19/2018   Name:  Amber Galloway   DOB:  04-17-1940   MRN:  [id number]   Chief Complaint: Gastroesophageal Reflux, Hypertension, Hyperlipidemia, and prediabetes (check A1C)  Gastroesophageal Reflux She reports no abdominal pain, no belching, no chest pain, no choking, no coughing, no dysphagia, no early satiety, no globus sensation, no heartburn, no hoarse voice, no nausea, no sore throat, no stridor, no tooth decay, no water brash or no wheezing. This is a new problem. The current episode started in the past 7 days. The problem occurs rarely. The problem has been gradually improving. The symptoms are aggravated by certain foods. Pertinent negatives include no anemia, fatigue, melena, muscle weakness, orthopnea or weight loss. She has tried a PPI for the symptoms. The treatment provided moderate relief.  Hypertension This is a chronic problem. The current episode started more than 1 year ago. The problem has been waxing and waning since onset. The problem is controlled. Pertinent negatives include no anxiety, blurred vision, chest pain, headaches, malaise/fatigue, neck pain, orthopnea, palpitations, peripheral edema, PND, shortness of breath or sweats. There are no associated agents to hypertension. There are no known risk factors for coronary artery disease. Past treatments include beta blockers and diuretics. The current treatment provides moderate improvement. There are no compliance problems.  There is no history of angina, kidney disease, CAD/MI, CVA, heart failure, left ventricular hypertrophy, PVD or retinopathy. There is no history of chronic renal disease, a hypertension causing med or renovascular disease.  Hyperlipidemia This is a chronic problem. The current episode started more than 1 year ago. The problem is controlled. Recent lipid tests were reviewed and are normal. She has no history of chronic renal disease. Factors aggravating her hyperlipidemia include thiazides.  Pertinent negatives include no chest pain, focal sensory loss, focal weakness, leg pain, myalgias or shortness of breath. Current antihyperlipidemic treatment includes statins and fibric acid derivatives. The current treatment provides moderate improvement of lipids. There are no compliance problems.  Risk factors for coronary artery disease include dyslipidemia and hypertension.  Anxiety Presents for follow-up visit. Symptoms include nervous/anxious behavior and panic. Patient reports no chest pain, confusion, dizziness, excessive worry, nausea, palpitations or shortness of breath. Symptoms occur rarely. The severity of symptoms is mild.      Review of Systems  Constitutional: Negative.  Negative for chills, fatigue, fever, malaise/fatigue, unexpected weight change and weight loss.  HENT: Negative for congestion, ear discharge, ear pain, hoarse voice, rhinorrhea, sinus pressure, sneezing and sore throat.   Eyes: Negative for blurred vision, photophobia, pain, discharge, redness and itching.  Respiratory: Negative for cough, choking, shortness of breath, wheezing and stridor.   Cardiovascular: Negative for chest pain, palpitations, orthopnea and PND.  Gastrointestinal: Negative for abdominal pain, blood in stool, constipation, diarrhea, dysphagia, heartburn, melena, nausea and vomiting.  Endocrine: Negative for cold intolerance, heat intolerance, polydipsia, polyphagia and polyuria.  Genitourinary: Negative for dysuria, flank pain, frequency, hematuria, menstrual problem, pelvic pain, urgency, vaginal bleeding and vaginal discharge.  Musculoskeletal: Negative for arthralgias, back pain, myalgias, muscle weakness and neck pain.  Skin: Negative for rash.  Allergic/Immunologic: Negative for environmental allergies and food allergies.  Neurological: Negative for dizziness, focal weakness, weakness, light-headedness, numbness and headaches.  Hematological: Negative for adenopathy. Does not  bruise/bleed easily.  Psychiatric/Behavioral: Negative for confusion and dysphoric mood. The patient is nervous/anxious.     Patient Active Problem List   Diagnosis Date Noted  . Acute anxiety 08/10/2017  . Overweight (BMI 25.0-29.9) 03/01/2017  .  Dizziness 06/05/2016  . Essential hypertension 06/06/2015  . Mixed hyperlipidemia 06/06/2015  . Gastroesophageal reflux disease without esophagitis 06/06/2015    No Known Allergies  Past Surgical History:  Procedure Laterality Date  . APPENDECTOMY    . CHOLECYSTECTOMY    . COLONOSCOPY  2014   cleared  . MASTECTOMY Right 08/2012  . VAGINAL HYSTERECTOMY      Social History   Tobacco Use  . Smoking status: Never Smoker  . Smokeless tobacco: Never Used  Substance Use Topics  . Alcohol use: No    Alcohol/week: 0.0 standard drinks  . Drug use: No     Medication list has been reviewed and updated.  Current Meds  Medication Sig  . ALPRAZolam (XANAX) 0.25 MG tablet Take 1 tablet (0.25 mg total) by mouth 1 day or 1 dose.  Marland Kitchen aspirin EC 81 MG tablet Take 81 mg by mouth daily.  . cetirizine (ZYRTEC) 10 MG tablet Take 1 tablet (10 mg total) by mouth daily.  Marland Kitchen gemfibrozil (LOPID) 600 MG tablet Take 1 tablet (600 mg total) by mouth 2 (two) times daily before a meal. TAKE 1 TABLET BY MOUTH TWICE A DAY  . hydrochlorothiazide (MICROZIDE) 12.5 MG capsule TAKE 1 CAPSULE BY MOUTH EVERY DAY  . latanoprost (XALATAN) 0.005 % ophthalmic solution Place 1 drop into both eyes at bedtime.  . meclizine (ANTIVERT) 25 MG tablet Take 1 tablet (25 mg total) by mouth 3 (three) times daily as needed for dizziness.  . metoprolol tartrate (LOPRESSOR) 50 MG tablet Take 1 tablet (50 mg total) by mouth 2 (two) times daily.  Marland Kitchen omeprazole (PRILOSEC) 20 MG capsule Take 1 capsule (20 mg total) by mouth daily.  . simvastatin (ZOCOR) 80 MG tablet Take 1 tablet (80 mg total) by mouth daily.    PHQ 2/9 Scores 08/19/2018 05/06/2018 01/08/2017 06/06/2015  PHQ - 2 Score 0 0 0  0  PHQ- 9 Score 0 0 0 -    BP Readings from Last 3 Encounters:  08/19/18 128/70  05/06/18 120/80  03/03/18 121/81    Physical Exam Vitals signs and nursing note reviewed.  Constitutional:      General: She is not in acute distress.    Appearance: She is not diaphoretic.  HENT:     Head: Normocephalic and atraumatic.     Right Ear: Tympanic membrane, ear canal and external ear normal.     Left Ear: Tympanic membrane, ear canal and external ear normal.     Nose: Nose normal.  Eyes:     General:        Right eye: No discharge.        Left eye: No discharge.     Conjunctiva/sclera: Conjunctivae normal.     Pupils: Pupils are equal, round, and reactive to light.  Neck:     Musculoskeletal: Normal range of motion and neck supple.     Thyroid: No thyromegaly.     Vascular: No JVD.  Cardiovascular:     Rate and Rhythm: Normal rate and regular rhythm.     Heart sounds: Normal heart sounds, S1 normal and S2 normal. No murmur. No systolic murmur. No diastolic murmur. No friction rub. No gallop. No S3 or S4 sounds.   Pulmonary:     Effort: Pulmonary effort is normal.     Breath sounds: Normal breath sounds.  Abdominal:     General: Bowel sounds are normal.     Palpations: Abdomen is soft. There is no mass.  Tenderness: There is no abdominal tenderness. There is no guarding.  Musculoskeletal: Normal range of motion.  Lymphadenopathy:     Cervical: No cervical adenopathy.  Skin:    General: Skin is warm and dry.  Neurological:     Mental Status: She is alert.     Deep Tendon Reflexes: Reflexes are normal and symmetric.     Wt Readings from Last 3 Encounters:  08/19/18 141 lb (64 kg)  05/06/18 143 lb (64.9 kg)  03/03/18 143 lb (64.9 kg)    BP 128/70   Ht 5\' 4"  (1.626 m)   Wt 141 lb (64 kg)   BMI 24.20 kg/m   Assessment and Plan: 1. Mixed hyperlipidemia Chronic.  Controlled.  Continue gemfibrozil 600 mg 1 tablet twice a day and simvastatin 80 mg once a day. -  gemfibrozil (LOPID) 600 MG tablet; Take 1 tablet (600 mg total) by mouth 2 (two) times daily before a meal. TAKE 1 TABLET BY MOUTH TWICE A DAY  Dispense: 180 tablet; Refill: 1 - simvastatin (ZOCOR) 80 MG tablet; Take 1 tablet (80 mg total) by mouth daily.  Dispense: 90 tablet; Refill: 1  2. Essential hypertension Chronic.  Controlled.  Continue hydrochlorothiazide 12.5 mg and metoprolol 50 mg 1 tablet twice a day.  Will check renal function panel. - hydrochlorothiazide (MICROZIDE) 12.5 MG capsule; TAKE 1 CAPSULE BY MOUTH EVERY DAY  Dispense: 90 capsule; Refill: 1 - metoprolol tartrate (LOPRESSOR) 50 MG tablet; Take 1 tablet (50 mg total) by mouth 2 (two) times daily.  Dispense: 180 tablet; Refill: 1 - Renal Function Panel  3. Gastroesophageal reflux disease without esophagitis Chronic.  Controlled.  Will continue olmesartan 20 mg once a day. - omeprazole (PRILOSEC) 20 MG capsule; Take 1 capsule (20 mg total) by mouth daily.  Dispense: 90 capsule; Refill: 1  4. Overweight (BMI 25.0-29.9) Health risks of being over weight were discussed and patient was counseled on weight loss options and exercise.  5. Acute anxiety patient with history of acute anxiety.  Continue Xanax on an as-needed basis. 6. Prediabetes Chronic.  Controlled.  Presently not needing any medications.  Will check A1c. - Renal Function Panel - Hemoglobin A1c

## 2018-08-20 LAB — RENAL FUNCTION PANEL
Albumin: 4.9 g/dL — ABNORMAL HIGH (ref 3.7–4.7)
BUN/Creatinine Ratio: 19 (ref 12–28)
BUN: 15 mg/dL (ref 8–27)
CO2: 27 mmol/L (ref 20–29)
Calcium: 10.1 mg/dL (ref 8.7–10.3)
Chloride: 96 mmol/L (ref 96–106)
Creatinine, Ser: 0.81 mg/dL (ref 0.57–1.00)
GFR calc Af Amer: 79 mL/min/{1.73_m2} (ref >59)
GFR calc non Af Amer: 69 mL/min/{1.73_m2} (ref >59)
Glucose: 107 mg/dL — ABNORMAL HIGH (ref 65–99)
Phosphorus: 3.2 mg/dL (ref 3.0–4.3)
Potassium: 4.3 mmol/L (ref 3.5–5.2)
Sodium: 140 mmol/L (ref 134–144)

## 2018-08-20 LAB — HEMOGLOBIN A1C
Est. average glucose Bld gHb Est-mCnc: 120 mg/dL
Hgb A1c MFr Bld: 5.8 % — ABNORMAL HIGH (ref 4.8–5.6)

## 2018-09-12 ENCOUNTER — Ambulatory Visit (INDEPENDENT_AMBULATORY_CARE_PROVIDER_SITE_OTHER): Payer: Medicare HMO

## 2018-09-12 ENCOUNTER — Other Ambulatory Visit: Payer: Self-pay

## 2018-09-12 DIAGNOSIS — Z23 Encounter for immunization: Secondary | ICD-10-CM

## 2018-09-12 DIAGNOSIS — Z1231 Encounter for screening mammogram for malignant neoplasm of breast: Secondary | ICD-10-CM

## 2018-09-12 NOTE — Progress Notes (Signed)
sched mammo for Oct 1 @ 10:00 in [place]

## 2018-09-29 DIAGNOSIS — L821 Other seborrheic keratosis: Secondary | ICD-10-CM | POA: Diagnosis not present

## 2018-09-29 DIAGNOSIS — L570 Actinic keratosis: Secondary | ICD-10-CM | POA: Diagnosis not present

## 2018-10-13 ENCOUNTER — Ambulatory Visit
Admission: RE | Admit: 2018-10-13 | Discharge: 2018-10-13 | Disposition: A | Discharge status: Home / Self Care | Payer: Medicare HMO | Source: Ambulatory Visit | Attending: Family Medicine | Admitting: Family Medicine

## 2018-10-13 ENCOUNTER — Encounter (INDEPENDENT_AMBULATORY_CARE_PROVIDER_SITE_OTHER): Payer: Self-pay

## 2018-10-13 ENCOUNTER — Other Ambulatory Visit: Payer: Self-pay | Admitting: Family Medicine

## 2018-10-13 ENCOUNTER — Other Ambulatory Visit: Payer: Self-pay

## 2018-10-13 DIAGNOSIS — R928 Other abnormal and inconclusive findings on diagnostic imaging of breast: Secondary | ICD-10-CM

## 2018-10-13 DIAGNOSIS — Z1231 Encounter for screening mammogram for malignant neoplasm of breast: Secondary | ICD-10-CM | POA: Diagnosis not present

## 2018-10-13 DIAGNOSIS — N6320 Unspecified lump in the left breast, unspecified quadrant: Secondary | ICD-10-CM

## 2018-10-26 ENCOUNTER — Ambulatory Visit
Admission: RE | Admit: 2018-10-26 | Discharge: 2018-10-26 | Disposition: A | Discharge status: Home / Self Care | Payer: Medicare HMO | Source: Ambulatory Visit | Attending: Family Medicine | Admitting: Family Medicine

## 2018-10-26 DIAGNOSIS — N6320 Unspecified lump in the left breast, unspecified quadrant: Secondary | ICD-10-CM | POA: Diagnosis not present

## 2018-10-26 DIAGNOSIS — R928 Other abnormal and inconclusive findings on diagnostic imaging of breast: Secondary | ICD-10-CM | POA: Insufficient documentation

## 2018-10-26 DIAGNOSIS — N6489 Other specified disorders of breast: Secondary | ICD-10-CM | POA: Diagnosis not present

## 2018-10-26 DIAGNOSIS — R922 Inconclusive mammogram: Secondary | ICD-10-CM | POA: Diagnosis not present

## 2018-11-17 DIAGNOSIS — H25013 Cortical age-related cataract, bilateral: Secondary | ICD-10-CM | POA: Diagnosis not present

## 2018-11-28 ENCOUNTER — Other Ambulatory Visit: Payer: Self-pay

## 2018-11-28 DIAGNOSIS — B379 Candidiasis, unspecified: Secondary | ICD-10-CM

## 2018-11-28 MED ORDER — FLUCONAZOLE 150 MG PO TABS: 150.0000 mg | ORAL_TABLET | Freq: Once | ORAL | 0 refills | Status: AC

## 2018-11-28 NOTE — Progress Notes (Unsigned)
Diflucan sent in [facility] [place]

## 2019-01-17 DIAGNOSIS — L02212 Cutaneous abscess of back [any part, except buttock]: Secondary | ICD-10-CM | POA: Diagnosis not present

## 2019-01-17 DIAGNOSIS — D485 Neoplasm of uncertain behavior of skin: Secondary | ICD-10-CM | POA: Diagnosis not present

## 2019-01-17 DIAGNOSIS — B079 Viral wart, unspecified: Secondary | ICD-10-CM | POA: Diagnosis not present

## 2019-02-06 ENCOUNTER — Other Ambulatory Visit: Payer: Self-pay

## 2019-02-06 ENCOUNTER — Ambulatory Visit (INDEPENDENT_AMBULATORY_CARE_PROVIDER_SITE_OTHER): Payer: Medicare HMO | Admitting: Family Medicine

## 2019-02-06 ENCOUNTER — Encounter: Payer: Self-pay | Admitting: Family Medicine

## 2019-02-06 VITALS — BP 120/70 | HR 64 | Ht 64.0 in | Wt 144.0 lb

## 2019-02-06 DIAGNOSIS — R102 Pelvic and perineal pain: Secondary | ICD-10-CM

## 2019-02-06 DIAGNOSIS — E782 Mixed hyperlipidemia: Secondary | ICD-10-CM | POA: Diagnosis not present

## 2019-02-06 DIAGNOSIS — R1032 Left lower quadrant pain: Secondary | ICD-10-CM

## 2019-02-06 LAB — HEMOCCULT GUIAC POC 1CARD (OFFICE): Fecal Occult Blood, POC: NEGATIVE

## 2019-02-06 NOTE — Progress Notes (Signed)
Date:  02/06/2019   Name:  Amber Galloway   DOB:  [birth year]   MRN:  [id number]   Chief Complaint: Pelvic Pain (hurting in lower l0 side and having pain during sex. has some discharge with strong odor, but is clear)  Pelvic Pain The patient's primary symptoms include pelvic pain and vaginal discharge. The patient's pertinent negatives include no genital itching, genital lesions, genital odor, genital rash, missed menses or vaginal bleeding. Primary symptoms comment: "but not blood". This is a new ("throbbing ache" couple of months) problem. The current episode started in the past 7 days (burning/dull). The problem occurs every several days. The problem has been waxing and waning. The pain is moderate. The problem affects the left side. Associated symptoms include painful intercourse. Pertinent negatives include no abdominal pain, anorexia, back pain, chills, constipation, diarrhea, discolored urine, dysuria, fever, flank pain, frequency, headaches, hematuria, joint pain, joint swelling, nausea, rash, sore throat, urgency or vomiting. The vaginal discharge was brown and dark. There has been no bleeding. She has not been passing clots. She has not been passing tissue. She has tried nothing for the symptoms. The treatment provided mild relief. She is sexually active. She is postmenopausal. Her past medical history is significant for a gynecological surgery. (Hysterectomy April Holding ligation/ appendectomy)  Abdominal Pain This is a new problem. The current episode started in the past 7 days. The problem occurs intermittently. The problem has been waxing and waning. The pain is located in the LLQ. The pain is moderate. Pertinent negatives include no anorexia, arthralgias, constipation, diarrhea, dysuria, fever, frequency, headaches, hematuria, myalgias, nausea or vomiting. hysterectomy /tubal ligation/ appendectomy    Lab Results  Component Value Date   CREATININE 0.81 08/19/2018   BUN 15 08/19/2018     NA 140 08/19/2018   K 4.3 08/19/2018   CL 96 08/19/2018   CO2 27 08/19/2018   Lab Results  Component Value Date   CHOL 202 (H) 02/07/2018   HDL 56 02/07/2018   LDLCALC 119 (H) 02/07/2018   TRIG 133 02/07/2018   CHOLHDL 3.7 03/01/2017   No results found for: TSH Lab Results  Component Value Date   HGBA1C 5.8 (H) 08/19/2018     Review of Systems  Constitutional: Negative.  Negative for chills, fatigue, fever and unexpected weight change.  HENT: Negative for congestion, ear discharge, ear pain, rhinorrhea, sinus pressure, sneezing and sore throat.   Eyes: Negative for photophobia, pain, discharge, redness and itching.  Respiratory: Negative for cough, shortness of breath, wheezing and stridor.   Gastrointestinal: Negative for abdominal pain, anorexia, blood in stool, constipation, diarrhea, nausea and vomiting.  Endocrine: Negative for cold intolerance, heat intolerance, polydipsia, polyphagia and polyuria.  Genitourinary: Positive for pelvic pain and vaginal discharge. Negative for dysuria, flank pain, frequency, hematuria, menstrual problem, missed menses, urgency and vaginal bleeding.  Musculoskeletal: Negative for arthralgias, back pain, joint pain and myalgias.  Skin: Negative for rash.  Allergic/Immunologic: Negative for environmental allergies and food allergies.  Neurological: Negative for dizziness, weakness, light-headedness, numbness and headaches.  Hematological: Negative for adenopathy. Does not bruise/bleed easily.  Psychiatric/Behavioral: Negative for dysphoric mood. The patient is not nervous/anxious.     Patient Active Problem List   Diagnosis Date Noted  . Acute anxiety 08/10/2017  . Overweight (BMI 25.0-29.9) 03/01/2017  . Dizziness 06/05/2016  . Essential hypertension 06/06/2015  . Mixed hyperlipidemia 06/06/2015  . Gastroesophageal reflux disease without esophagitis 06/06/2015    No Known Allergies  Past Surgical History:  Procedure  Laterality  Date  . APPENDECTOMY    . CHOLECYSTECTOMY    . COLONOSCOPY  2014   cleared  . MASTECTOMY Right 08/2012  . VAGINAL HYSTERECTOMY      Social History   Tobacco Use  . Smoking status: Never Smoker  . Smokeless tobacco: Never Used  Substance Use Topics  . Alcohol use: No    Alcohol/week: 0.0 standard drinks  . Drug use: No     Medication list has been reviewed and updated.  Current Meds  Medication Sig  . ALPRAZolam (XANAX) 0.25 MG tablet Take 1 tablet (0.25 mg total) by mouth 1 day or 1 dose.  Marland Kitchen aspirin EC 81 MG tablet Take 81 mg by mouth daily.  . cetirizine (ZYRTEC) 10 MG tablet Take 1 tablet (10 mg total) by mouth daily.  . cyclobenzaprine (FLEXERIL) 10 MG tablet Take 1 tablet (10 mg total) by mouth 3 (three) times daily as needed for muscle spasms.  Marland Kitchen gemfibrozil (LOPID) 600 MG tablet Take 1 tablet (600 mg total) by mouth 2 (two) times daily before a meal. TAKE 1 TABLET BY MOUTH TWICE A DAY  . hydrochlorothiazide (MICROZIDE) 12.5 MG capsule TAKE 1 CAPSULE BY MOUTH EVERY DAY  . meclizine (ANTIVERT) 25 MG tablet Take 1 tablet (25 mg total) by mouth 3 (three) times daily as needed for dizziness.  . metoprolol tartrate (LOPRESSOR) 50 MG tablet Take 1 tablet (50 mg total) by mouth 2 (two) times daily.  Marland Kitchen omeprazole (PRILOSEC) 20 MG capsule Take 1 capsule (20 mg total) by mouth daily.  . simvastatin (ZOCOR) 80 MG tablet Take 1 tablet (80 mg total) by mouth daily.    PHQ 2/9 Scores 02/06/2019 08/19/2018 05/06/2018 01/08/2017  PHQ - 2 Score 0 0 0 0  PHQ- 9 Score 0 0 0 0    BP Readings from Last 3 Encounters:  02/06/19 120/70  08/19/18 128/70  05/06/18 120/80    Physical Exam Vitals and nursing note reviewed.  Constitutional:      General: She is not in acute distress.    Appearance: She is not diaphoretic.  HENT:     Head: Normocephalic and atraumatic.     Jaw: There is normal jaw occlusion.     Right Ear: Hearing, tympanic membrane, ear canal and external ear normal.      Left Ear: Hearing, tympanic membrane, ear canal and external ear normal.     Nose: Nose normal.     Mouth/Throat:     Lips: Pink.     Mouth: Mucous membranes are moist.     Pharynx: Oropharynx is clear. Uvula midline.  Eyes:     General:        Right eye: No discharge.        Left eye: No discharge.     Conjunctiva/sclera: Conjunctivae normal.     Pupils: Pupils are equal, round, and reactive to light.  Neck:     Thyroid: No thyroid mass, thyromegaly or thyroid tenderness.     Vascular: Normal carotid pulses. No carotid bruit, hepatojugular reflux or JVD.  Cardiovascular:     Rate and Rhythm: Normal rate and regular rhythm.     Chest Wall: PMI is not displaced.     Pulses: Normal pulses.     Heart sounds: Normal heart sounds, S1 normal and S2 normal. Heart sounds not distant. No murmur. No systolic murmur. No diastolic murmur. No friction rub. No gallop. No S3 or S4 sounds.   Pulmonary:  Effort: Pulmonary effort is normal.     Breath sounds: Normal breath sounds. No decreased breath sounds, wheezing, rhonchi or rales.  Abdominal:     General: Bowel sounds are normal.     Palpations: Abdomen is soft. There is no hepatomegaly, splenomegaly or mass.     Tenderness: There is abdominal tenderness in the left lower quadrant. There is no right CVA tenderness, left CVA tenderness, guarding or rebound.  Genitourinary:    General: Normal vulva.     Exam position: Lithotomy position.     Labia:        Right: No rash, tenderness or lesion.        Left: No rash, tenderness or lesion.      Urethra: No prolapse, urethral pain or urethral swelling.     Vagina: Normal.     Cervix: Normal.     Uterus: Absent. Not tender.      Adnexa:        Right: No mass, tenderness or fullness.         Left: No mass, tenderness or fullness.       Rectum: Normal. Guaiac result negative.     Comments: Vaginal guiac negative Musculoskeletal:        General: Normal range of motion.     Cervical back:  Normal range of motion and neck supple.  Lymphadenopathy:     Cervical: No cervical adenopathy.  Skin:    General: Skin is warm and dry.  Neurological:     Mental Status: She is alert.     Deep Tendon Reflexes: Reflexes are normal and symmetric.     Wt Readings from Last 3 Encounters:  02/06/19 144 lb (65.3 kg)  08/19/18 141 lb (64 kg)  05/06/18 143 lb (64.9 kg)    BP 120/70   Pulse 64   Ht 5\' 4"  (1.626 m)   Wt 144 lb (65.3 kg)   BMI 24.72 kg/m   Assessment and Plan: 1. Left lower quadrant abdominal pain New onset.  Controlled.  Variable.  Stable.  Patient has an ache is seen in the left lower quadrant without radiation since Thursday.  Patient has had a history of an appendectomy and hysterectomy.  Will obtain an ultrasound of the abdominal complete to assess particularly of the left side.  As well as the suprapubic area.  Will obtain a CBC with CMP.  And point-of-care occult blood was negative and urinalysis was unable to obtain. - US Pelvic Complete With Transvaginal; Future - US Abdomen Complete; Future - CBC with Differential/Platelet - Comprehensive metabolic panel - POCT occult blood stool - POCT urinalysis dipstick  2. Pelvic pain New onset.  Controlled.  Variable.  Stable.  Patient status post hysterectomy but there was no removal of ovaries.  Bimanual exam was unremarkable for mass or fullness or significant tenderness.  We will obtain an ultrasound of the pelvic with transvaginal eval.  We will check a CBC as well. - US Pelvic Complete With Transvaginal; Future - US Abdomen Complete; Future - CBC with Differential/Platelet - Comprehensive metabolic panel - POCT occult blood stool - POCT urinalysis dipstick

## 2019-02-07 LAB — CBC WITH DIFFERENTIAL/PLATELET
Basophils Absolute: 0 10*3/uL (ref 0.0–0.2)
Basos: 0 %
EOS (ABSOLUTE): 0.2 10*3/uL (ref 0.0–0.4)
Eos: 2 %
Hematocrit: 40 % (ref 34.0–46.6)
Hemoglobin: 13.8 g/dL (ref 11.1–15.9)
Immature Grans (Abs): 0 10*3/uL (ref 0.0–0.1)
Immature Granulocytes: 0 %
Lymphocytes Absolute: 1.8 10*3/uL (ref 0.7–3.1)
Lymphs: 25 %
MCH: 30.5 pg (ref 26.6–33.0)
MCHC: 34.5 g/dL (ref 31.5–35.7)
MCV: 89 fL (ref 79–97)
Monocytes Absolute: 0.6 10*3/uL (ref 0.1–0.9)
Monocytes: 8 %
Neutrophils Absolute: 4.7 10*3/uL (ref 1.4–7.0)
Neutrophils: 65 %
Platelets: 206 10*3/uL (ref 150–450)
RBC: 4.52 x10E6/uL (ref 3.77–5.28)
RDW: 12.9 % (ref 11.7–15.4)
WBC: 7.3 10*3/uL (ref 3.4–10.8)

## 2019-02-07 LAB — COMPREHENSIVE METABOLIC PANEL
ALT: 14 IU/L (ref 0–32)
AST: 26 IU/L (ref 0–40)
Albumin/Globulin Ratio: 1.6 (ref 1.2–2.2)
Albumin: 4.5 g/dL (ref 3.7–4.7)
Alkaline Phosphatase: 78 IU/L (ref 39–117)
BUN/Creatinine Ratio: 21 (ref 12–28)
BUN: 19 mg/dL (ref 8–27)
Bilirubin Total: 0.8 mg/dL (ref 0.0–1.2)
CO2: 25 mmol/L (ref 20–29)
Calcium: 10.3 mg/dL (ref 8.7–10.3)
Chloride: 100 mmol/L (ref 96–106)
Creatinine, Ser: 0.92 mg/dL (ref 0.57–1.00)
GFR calc Af Amer: 68 mL/min/{1.73_m2} (ref >59)
GFR calc non Af Amer: 59 mL/min/{1.73_m2} — ABNORMAL LOW (ref >59)
Globulin, Total: 2.8 g/dL (ref 1.5–4.5)
Glucose: 120 mg/dL — ABNORMAL HIGH (ref 65–99)
Potassium: 4.5 mmol/L (ref 3.5–5.2)
Sodium: 142 mmol/L (ref 134–144)
Total Protein: 7.3 g/dL (ref 6.0–8.5)

## 2019-02-07 LAB — LIPID PANEL WITH LDL/HDL RATIO
Cholesterol, Total: 196 mg/dL (ref 100–199)
HDL: 62 mg/dL (ref >39)
LDL Chol Calc (NIH): 117 mg/dL — ABNORMAL HIGH (ref 0–99)
LDL/HDL Ratio: 1.9 ratio (ref 0.0–3.2)
Triglycerides: 96 mg/dL (ref 0–149)
VLDL Cholesterol Cal: 17 mg/dL (ref 5–40)

## 2019-02-08 ENCOUNTER — Other Ambulatory Visit: Payer: Self-pay

## 2019-02-08 ENCOUNTER — Ambulatory Visit
Admission: RE | Admit: 2019-02-08 | Discharge: 2019-02-08 | Disposition: A | Discharge status: Home / Self Care | Payer: Medicare HMO | Source: Ambulatory Visit | Attending: Family Medicine | Admitting: Family Medicine

## 2019-02-08 ENCOUNTER — Ambulatory Visit: Admission: RE | Admit: 2019-02-08 | Payer: Medicare HMO | Source: Ambulatory Visit

## 2019-02-08 DIAGNOSIS — R1032 Left lower quadrant pain: Secondary | ICD-10-CM | POA: Diagnosis not present

## 2019-02-08 DIAGNOSIS — R102 Pelvic and perineal pain: Secondary | ICD-10-CM | POA: Diagnosis not present

## 2019-02-15 ENCOUNTER — Other Ambulatory Visit: Payer: Self-pay | Admitting: Family Medicine

## 2019-02-15 DIAGNOSIS — I10 Essential (primary) hypertension: Secondary | ICD-10-CM

## 2019-02-15 DIAGNOSIS — E782 Mixed hyperlipidemia: Secondary | ICD-10-CM

## 2019-02-17 ENCOUNTER — Other Ambulatory Visit: Payer: Self-pay

## 2019-02-17 ENCOUNTER — Ambulatory Visit (INDEPENDENT_AMBULATORY_CARE_PROVIDER_SITE_OTHER): Payer: Medicare HMO | Admitting: Family Medicine

## 2019-02-17 ENCOUNTER — Encounter: Payer: Self-pay | Admitting: Family Medicine

## 2019-02-17 VITALS — BP 130/80 | HR 64 | Ht 64.0 in | Wt 143.0 lb

## 2019-02-17 DIAGNOSIS — I10 Essential (primary) hypertension: Secondary | ICD-10-CM

## 2019-02-17 DIAGNOSIS — R1031 Right lower quadrant pain: Secondary | ICD-10-CM

## 2019-02-17 DIAGNOSIS — E782 Mixed hyperlipidemia: Secondary | ICD-10-CM | POA: Diagnosis not present

## 2019-02-17 MED ORDER — SIMVASTATIN 80 MG PO TABS: 80.0000 mg | ORAL_TABLET | Freq: Every day | ORAL | 1 refills | Status: DC

## 2019-02-17 MED ORDER — HYDROCHLOROTHIAZIDE 12.5 MG PO CAPS: ORAL_CAPSULE | ORAL | 1 refills | Status: DC

## 2019-02-17 MED ORDER — GEMFIBROZIL 600 MG PO TABS: 600.0000 mg | ORAL_TABLET | Freq: Two times a day (BID) | ORAL | 1 refills | Status: DC

## 2019-02-17 MED ORDER — METOPROLOL TARTRATE 50 MG PO TABS: 50.0000 mg | ORAL_TABLET | Freq: Two times a day (BID) | ORAL | 1 refills | Status: DC

## 2019-02-17 NOTE — Progress Notes (Signed)
Date:  02/17/2019   Name:  Amber Galloway   DOB:  [birth year]   MRN:  [id number]   Chief Complaint: Hypertension, Hyperlipidemia, and Dizziness (meclizine as needed)  Hypertension This is a chronic problem. The current episode started more than 1 year ago. The problem has been gradually improving since onset. The problem is controlled. Pertinent negatives include no anxiety, blurred vision, chest pain, headaches, malaise/fatigue, neck pain, orthopnea, palpitations, peripheral edema, PND, shortness of breath or sweats. There are no associated agents to hypertension. There are no known risk factors for coronary artery disease. Past treatments include diuretics and beta blockers. The current treatment provides moderate improvement. There are no compliance problems.  There is no history of angina, kidney disease, CAD/MI, CVA, heart failure, left ventricular hypertrophy, PVD or retinopathy. There is no history of chronic renal disease, a hypertension causing med or renovascular disease.  Hyperlipidemia This is a chronic problem. The current episode started more than 1 year ago. The problem is controlled. Recent lipid tests were reviewed and are normal. She has no history of chronic renal disease, diabetes, hypothyroidism, liver disease, obesity or nephrotic syndrome. Pertinent negatives include no chest pain, focal sensory loss, focal weakness, myalgias or shortness of breath. Current antihyperlipidemic treatment includes statins and fibric acid derivatives. The current treatment provides moderate improvement of lipids. There are no compliance problems.   Dizziness This is a chronic problem. The current episode started more than 1 year ago. The problem occurs rarely. The problem has been unchanged. Pertinent negatives include no abdominal pain, anorexia, arthralgias, chest pain, chills, congestion, coughing, fatigue, fever, headaches, myalgias, nausea, neck pain, numbness, rash, sore throat, vomiting or  weakness.  Female GU Problem The patient's pertinent negatives include no genital itching, genital lesions, genital odor, genital rash, missed menses, pelvic pain or vaginal discharge. Primary symptoms comment: dysparenia/? cystocoel. This is a new problem. The current episode started more than 1 month ago. The problem occurs intermittently. The problem has been waxing and waning. The pain is moderate. Associated symptoms include painful intercourse. Pertinent negatives include no abdominal pain, anorexia, back pain, chills, constipation, diarrhea, discolored urine, dysuria, fever, flank pain, frequency, headaches, hematuria, joint pain, joint swelling, nausea, rash, sore throat, urgency or vomiting. She is postmenopausal. Her past medical history is significant for a gynecological surgery.  Abdominal Pain This is a recurrent problem. The current episode started more than 1 month ago. The problem occurs intermittently. The problem has been waxing and waning. The pain is located in the RLQ. The pain is mild. The quality of the pain is aching. Pertinent negatives include no anorexia, arthralgias, constipation, diarrhea, dysuria, fever, frequency, headaches, hematuria, myalgias, nausea or vomiting.    Lab Results  Component Value Date   CREATININE 0.92 02/06/2019   BUN 19 02/06/2019   NA 142 02/06/2019   K 4.5 02/06/2019   CL 100 02/06/2019   CO2 25 02/06/2019   Lab Results  Component Value Date   CHOL 196 02/06/2019   HDL 62 02/06/2019   LDLCALC 117 (H) 02/06/2019   TRIG 96 02/06/2019   CHOLHDL 3.7 03/01/2017   No results found for: TSH Lab Results  Component Value Date   HGBA1C 5.8 (H) 08/19/2018     Review of Systems  Constitutional: Negative.  Negative for chills, fatigue, fever, malaise/fatigue and unexpected weight change.  HENT: Negative for congestion, ear discharge, ear pain, rhinorrhea, sinus pressure, sneezing and sore throat.   Eyes: Negative for blurred vision,  photophobia, pain,  discharge, redness and itching.  Respiratory: Negative for cough, shortness of breath, wheezing and stridor.   Cardiovascular: Negative for chest pain, palpitations, orthopnea and PND.  Gastrointestinal: Negative for abdominal pain, anorexia, blood in stool, constipation, diarrhea, nausea and vomiting.  Endocrine: Negative for cold intolerance, heat intolerance, polydipsia, polyphagia and polyuria.  Genitourinary: Negative for dysuria, flank pain, frequency, hematuria, menstrual problem, missed menses, pelvic pain, urgency, vaginal bleeding and vaginal discharge.  Musculoskeletal: Negative for arthralgias, back pain, joint pain, myalgias and neck pain.  Skin: Negative for rash.  Allergic/Immunologic: Negative for environmental allergies and food allergies.  Neurological: Positive for dizziness. Negative for focal weakness, weakness, light-headedness, numbness and headaches.  Hematological: Negative for adenopathy. Does not bruise/bleed easily.  Psychiatric/Behavioral: Negative for dysphoric mood. The patient is not nervous/anxious.     Patient Active Problem List   Diagnosis Date Noted  . Acute anxiety 08/10/2017  . Overweight (BMI 25.0-29.9) 03/01/2017  . Dizziness 06/05/2016  . Essential hypertension 06/06/2015  . Mixed hyperlipidemia 06/06/2015  . Gastroesophageal reflux disease without esophagitis 06/06/2015    No Known Allergies  Past Surgical History:  Procedure Laterality Date  . APPENDECTOMY    . CHOLECYSTECTOMY    . COLONOSCOPY  2014   cleared  . MASTECTOMY Right 08/2012  . VAGINAL HYSTERECTOMY      Social History   Tobacco Use  . Smoking status: Never Smoker  . Smokeless tobacco: Never Used  Substance Use Topics  . Alcohol use: No    Alcohol/week: 0.0 standard drinks  . Drug use: No     Medication list has been reviewed and updated.  Current Meds  Medication Sig  . ALPRAZolam (XANAX) 0.25 MG tablet Take 1 tablet (0.25 mg total) by mouth  1 day or 1 dose.  Marland Kitchen aspirin EC 81 MG tablet Take 81 mg by mouth daily.  . cetirizine (ZYRTEC) 10 MG tablet Take 1 tablet (10 mg total) by mouth daily.  . cyclobenzaprine (FLEXERIL) 10 MG tablet Take 1 tablet (10 mg total) by mouth 3 (three) times daily as needed for muscle spasms.  Marland Kitchen gemfibrozil (LOPID) 600 MG tablet TAKE 1 TABLET (600 MG TOTAL) BY MOUTH 2 (TWO) TIMES DAILY BEFORE A MEAL. TAKE 1 TABLET BY MOUTH TWICE A DAY  . hydrochlorothiazide (MICROZIDE) 12.5 MG capsule TAKE 1 CAPSULE BY MOUTH EVERY DAY  . latanoprost (XALATAN) 0.005 % ophthalmic solution Place 1 drop into both eyes at bedtime.  . meclizine (ANTIVERT) 25 MG tablet Take 1 tablet (25 mg total) by mouth 3 (three) times daily as needed for dizziness.  . metoprolol tartrate (LOPRESSOR) 50 MG tablet TAKE 1 TABLET BY MOUTH TWICE A DAY  . omeprazole (PRILOSEC) 20 MG capsule Take 1 capsule (20 mg total) by mouth daily.  . simvastatin (ZOCOR) 80 MG tablet Take 1 tablet (80 mg total) by mouth daily.  . [DISCONTINUED] meloxicam (MOBIC) 7.5 MG tablet TAKE 1 TABLET BY MOUTH EVERY DAY    PHQ 2/9 Scores 02/06/2019 08/19/2018 05/06/2018 01/08/2017  PHQ - 2 Score 0 0 0 0  PHQ- 9 Score 0 0 0 0    BP Readings from Last 3 Encounters:  02/17/19 130/80  02/06/19 120/70  08/19/18 128/70    Physical Exam Vitals and nursing note reviewed. Exam conducted with a chaperone present.  Constitutional:      General: She is not in acute distress.    Appearance: She is not diaphoretic.  HENT:     Head: Normocephalic and atraumatic.  Right Ear: External ear normal.     Left Ear: External ear normal.     Nose: Nose normal.  Eyes:     General:        Right eye: No discharge.        Left eye: No discharge.     Conjunctiva/sclera: Conjunctivae normal.     Pupils: Pupils are equal, round, and reactive to light.  Neck:     Thyroid: No thyromegaly.     Vascular: No JVD.  Cardiovascular:     Rate and Rhythm: Normal rate and regular rhythm.      Heart sounds: Normal heart sounds. No murmur. No friction rub. No gallop.   Pulmonary:     Effort: Pulmonary effort is normal.     Breath sounds: Normal breath sounds. No wheezing or rhonchi.  Abdominal:     General: Bowel sounds are normal.     Palpations: Abdomen is soft. There is no mass.     Tenderness: There is abdominal tenderness in the right lower quadrant. There is no right CVA tenderness, left CVA tenderness, guarding or rebound.     Hernia: No hernia is present.  Genitourinary:    Exam position: Lithotomy position.     Labia:        Right: No rash or tenderness.        Left: No rash or tenderness.      Urethra: Prolapse present. No urethral pain, urethral swelling or urethral lesion.     Vagina: Normal.     Adnexa: Right adnexa normal and left adnexa normal.       Right: No mass, tenderness or fullness.         Left: No mass, tenderness or fullness.       Rectum: Guaiac result negative. Tenderness present.     Comments: Tender RLQ Musculoskeletal:        General: Normal range of motion.     Cervical back: Normal range of motion and neck supple.  Lymphadenopathy:     Cervical: No cervical adenopathy.  Skin:    General: Skin is warm and dry.  Neurological:     Mental Status: She is alert.     Deep Tendon Reflexes: Reflexes are normal and symmetric.     Wt Readings from Last 3 Encounters:  02/17/19 143 lb (64.9 kg)  02/06/19 144 lb (65.3 kg)  08/19/18 141 lb (64 kg)    BP 130/80   Pulse 64   Ht 5\' 4"  (1.626 m)   Wt 143 lb (64.9 kg)   BMI 24.55 kg/m   Assessment and Plan:  1. Mixed hyperlipidemia Chronic.  Controlled.  Stable.  Will continue gemfibrozil 600 mg 1 twice a day as well as simvastatin 80 mg once a day.  Review of previous renal panel was unremarkable and will be repeated in 6 months - gemfibrozil (LOPID) 600 MG tablet; Take 1 tablet (600 mg total) by mouth 2 (two) times daily before a meal. TAKE 1 TABLET BY MOUTH TWICE A DAY  Dispense: 180 tablet;  Refill: 1 - simvastatin (ZOCOR) 80 MG tablet; Take 1 tablet (80 mg total) by mouth daily.  Dispense: 90 tablet; Refill: 1  2. Essential hypertension Chronic.  Controlled.  Stable.  Continue hydrochlorothiazide 12.5 mg once a day and metoprolol 50 mg twice a day.  Review of previous lipid panel is acceptable. - hydrochlorothiazide (MICROZIDE) 12.5 MG capsule; TAKE 1 CAPSULE BY MOUTH EVERY DAY  Dispense: 90 capsule; Refill: 1 -  metoprolol tartrate (LOPRESSOR) 50 MG tablet; Take 1 tablet (50 mg total) by mouth 2 (two) times daily.  Dispense: 180 tablet; Refill: 1  3. Right lower quadrant abdominal pain New onset.  Persistent.  Stable.  Complicated.  Patient is uncertain whether this is upper pelvic nature because she has dyspareunia but the pain is beginning at first in the left lower quadrant and now has settled in the right lower quadrant.  Patient has tenderness of the right quadrant primarily at this time.  Pelvic exam/bimanual is unremarkable for palpable mass in the adnexal areas and review of the ultrasound noted no visible ovaries however this may have been obscured by bowel.  Rectal exam is negative for mass and guaiac was negative.  We will begin in the direction of GI because the pain seems to be more in the abdominal quadrant and the pelvic ultrasound was unremarkable.  We will refer to gastroenterology and abdomen because patient does not want to go to [facility] and proceed in this direction first and if there is no concern our next step will be referral for GYN to determine whether or not there is a cystocele rectocele possibility. - Ambulatory referral to Gastroenterology

## 2019-02-21 ENCOUNTER — Telehealth: Payer: Self-pay

## 2019-02-21 NOTE — Telephone Encounter (Signed)
Pt called in to let us know that she has been scheduled for Feb 18th for GI eval.

## 2019-03-02 ENCOUNTER — Ambulatory Visit: Payer: Medicare HMO | Admitting: Gastroenterology

## 2019-03-14 ENCOUNTER — Telehealth: Payer: Self-pay

## 2019-03-14 NOTE — Telephone Encounter (Signed)
Pt called in stating that Amber Galloway has cancelled and pushed her appt further out to March 30th. She doesn't feel she can wait that long as she is still having pain in the lower abdomen. We got her in with [facility] in [place] March 4th @ 10:30 in [place]. LM on pt's phone and told her to call me back letting me know she received my message

## 2019-03-16 DIAGNOSIS — Z86010 Personal history of colonic polyps: Secondary | ICD-10-CM | POA: Diagnosis not present

## 2019-03-16 DIAGNOSIS — N941 Unspecified dyspareunia: Secondary | ICD-10-CM | POA: Diagnosis not present

## 2019-03-16 DIAGNOSIS — R102 Pelvic and perineal pain: Secondary | ICD-10-CM | POA: Diagnosis not present

## 2019-03-30 DIAGNOSIS — R69 Illness, unspecified: Secondary | ICD-10-CM | POA: Diagnosis not present

## 2019-04-11 ENCOUNTER — Ambulatory Visit: Payer: Medicare HMO | Admitting: Gastroenterology

## 2019-04-11 DIAGNOSIS — R69 Illness, unspecified: Secondary | ICD-10-CM | POA: Diagnosis not present

## 2019-04-27 ENCOUNTER — Telehealth: Payer: Self-pay

## 2019-04-27 DIAGNOSIS — R102 Pelvic and perineal pain: Secondary | ICD-10-CM | POA: Diagnosis not present

## 2019-04-27 DIAGNOSIS — N9089 Other specified noninflammatory disorders of vulva and perineum: Secondary | ICD-10-CM | POA: Diagnosis not present

## 2019-04-27 DIAGNOSIS — N941 Unspecified dyspareunia: Secondary | ICD-10-CM | POA: Diagnosis not present

## 2019-04-27 DIAGNOSIS — N898 Other specified noninflammatory disorders of vagina: Secondary | ICD-10-CM | POA: Diagnosis not present

## 2019-04-27 DIAGNOSIS — R300 Dysuria: Secondary | ICD-10-CM | POA: Diagnosis not present

## 2019-04-27 NOTE — Telephone Encounter (Signed)
Pt called to say she saw Dr Leafy Ro today- she has a game plan/ changed ointment/ creams and will repeat US in 3 weeks if not better

## 2019-05-03 DIAGNOSIS — R69 Illness, unspecified: Secondary | ICD-10-CM | POA: Diagnosis not present

## 2019-05-11 ENCOUNTER — Ambulatory Visit: Payer: Medicare HMO | Attending: Internal Medicine

## 2019-05-11 DIAGNOSIS — Z23 Encounter for immunization: Secondary | ICD-10-CM

## 2019-05-11 NOTE — Progress Notes (Signed)
   Covid-19 Vaccination Clinic  Name:  Amber Galloway    MRN: [id number] DOB: February 08, 1940  05/11/2019  Amber Galloway was observed post Covid-19 immunization for 15 minutes without incident. She was provided with Vaccine Information Sheet and instruction to access the V-Safe system.   Amber Galloway was instructed to call 911 with any severe reactions post vaccine: Marland Kitchen Difficulty breathing  . Swelling of face and throat  . A fast heartbeat  . A bad rash all over body  . Dizziness and weakness   Immunizations Administered    Name Date Dose VIS Date Route   Pfizer COVID-19 Vaccine 05/11/2019  8:15 AM 0.3 mL 03/08/2018 Intramuscular   Manufacturer: [employer], [employer]   Lot: [id number]   [place]: [id number]

## 2019-05-18 DIAGNOSIS — R6889 Other general symptoms and signs: Secondary | ICD-10-CM | POA: Diagnosis not present

## 2019-05-18 DIAGNOSIS — R102 Pelvic and perineal pain: Secondary | ICD-10-CM | POA: Diagnosis not present

## 2019-05-18 DIAGNOSIS — N941 Unspecified dyspareunia: Secondary | ICD-10-CM | POA: Diagnosis not present

## 2019-05-18 DIAGNOSIS — L859 Epidermal thickening, unspecified: Secondary | ICD-10-CM | POA: Diagnosis not present

## 2019-05-18 DIAGNOSIS — N9089 Other specified noninflammatory disorders of vulva and perineum: Secondary | ICD-10-CM | POA: Diagnosis not present

## 2019-05-25 ENCOUNTER — Other Ambulatory Visit: Payer: Self-pay | Admitting: Obstetrics and Gynecology

## 2019-05-25 DIAGNOSIS — R102 Pelvic and perineal pain: Secondary | ICD-10-CM

## 2019-05-25 DIAGNOSIS — N9089 Other specified noninflammatory disorders of vulva and perineum: Secondary | ICD-10-CM

## 2019-05-25 DIAGNOSIS — N941 Unspecified dyspareunia: Secondary | ICD-10-CM

## 2019-06-01 ENCOUNTER — Ambulatory Visit
Admission: RE | Admit: 2019-06-01 | Discharge: 2019-06-01 | Disposition: A | Discharge status: Home / Self Care | Payer: Medicare HMO | Source: Ambulatory Visit | Attending: Obstetrics and Gynecology | Admitting: Obstetrics and Gynecology

## 2019-06-01 ENCOUNTER — Other Ambulatory Visit: Payer: Self-pay

## 2019-06-01 ENCOUNTER — Other Ambulatory Visit
Admission: RE | Admit: 2019-06-01 | Discharge: 2019-06-01 | Disposition: A | Discharge status: Home / Self Care | Payer: Medicare HMO | Source: Ambulatory Visit | Attending: Obstetrics and Gynecology | Admitting: Obstetrics and Gynecology

## 2019-06-01 DIAGNOSIS — N9089 Other specified noninflammatory disorders of vulva and perineum: Secondary | ICD-10-CM | POA: Insufficient documentation

## 2019-06-01 DIAGNOSIS — N941 Unspecified dyspareunia: Secondary | ICD-10-CM | POA: Diagnosis present

## 2019-06-01 DIAGNOSIS — R1030 Lower abdominal pain, unspecified: Secondary | ICD-10-CM | POA: Diagnosis not present

## 2019-06-01 DIAGNOSIS — R102 Pelvic and perineal pain: Secondary | ICD-10-CM | POA: Diagnosis present

## 2019-06-01 LAB — COMPREHENSIVE METABOLIC PANEL
ALT: 15 U/L (ref 0–44)
AST: 25 U/L (ref 15–41)
Albumin: 4.2 g/dL (ref 3.5–5.0)
Alkaline Phosphatase: 65 U/L (ref 38–126)
Anion gap: 11 (ref 5–15)
BUN: 19 mg/dL (ref 8–23)
CO2: 28 mmol/L (ref 22–32)
Calcium: 9.7 mg/dL (ref 8.9–10.3)
Chloride: 97 mmol/L — ABNORMAL LOW (ref 98–111)
Creatinine, Ser: 0.8 mg/dL (ref 0.44–1.00)
GFR calc Af Amer: >60 mL/min (ref >60)
GFR calc non Af Amer: >60 mL/min (ref >60)
Glucose, Bld: 116 mg/dL — ABNORMAL HIGH (ref 70–99)
Potassium: 4 mmol/L (ref 3.5–5.1)
Sodium: 136 mmol/L (ref 135–145)
Total Bilirubin: 0.8 mg/dL (ref 0.3–1.2)
Total Protein: 7.8 g/dL (ref 6.5–8.1)

## 2019-06-01 MED ORDER — IOHEXOL 300 MG/ML  SOLN
100.0000 mL | Freq: Once | INTRAMUSCULAR | Status: AC | PRN
  Administered 2019-06-01: 100 mL via INTRAVENOUS

## 2019-06-06 ENCOUNTER — Ambulatory Visit: Payer: Medicare HMO | Attending: Internal Medicine

## 2019-06-06 DIAGNOSIS — Z23 Encounter for immunization: Secondary | ICD-10-CM

## 2019-06-06 NOTE — Progress Notes (Signed)
   Covid-19 Vaccination Clinic  Name:  Amber Galloway    MRN: [id number] DOB: 03/02/1940  06/06/2019  [name] was observed post Covid-19 immunization for 15 minutes without incident. She was provided with Vaccine Information Sheet and instruction to access the V-Safe system.   [name] was instructed to call 911 with any severe reactions post vaccine: Marland Kitchen Difficulty breathing  . Swelling of face and throat  . A fast heartbeat  . A bad rash all over body  . Dizziness and weakness   Immunizations Administered    Name Date Dose VIS Date Route   Pfizer COVID-19 Vaccine 06/06/2019  8:12 AM 0.3 mL 03/08/2018 Intramuscular   Manufacturer: [employer], [employer]   Lot: [id number]   [place]: [id number]

## 2019-06-08 DIAGNOSIS — Z803 Family history of malignant neoplasm of breast: Secondary | ICD-10-CM | POA: Diagnosis not present

## 2019-06-08 DIAGNOSIS — I10 Essential (primary) hypertension: Secondary | ICD-10-CM | POA: Diagnosis not present

## 2019-06-08 DIAGNOSIS — I2510 Atherosclerotic heart disease of native coronary artery without angina pectoris: Secondary | ICD-10-CM | POA: Diagnosis not present

## 2019-06-08 DIAGNOSIS — Z8249 Family history of ischemic heart disease and other diseases of the circulatory system: Secondary | ICD-10-CM | POA: Diagnosis not present

## 2019-06-08 DIAGNOSIS — E785 Hyperlipidemia, unspecified: Secondary | ICD-10-CM | POA: Diagnosis not present

## 2019-06-08 DIAGNOSIS — Z6825 Body mass index (BMI) 25.0-25.9, adult: Secondary | ICD-10-CM | POA: Diagnosis not present

## 2019-06-08 DIAGNOSIS — R32 Unspecified urinary incontinence: Secondary | ICD-10-CM | POA: Diagnosis not present

## 2019-06-08 DIAGNOSIS — I252 Old myocardial infarction: Secondary | ICD-10-CM | POA: Diagnosis not present

## 2019-06-08 DIAGNOSIS — E663 Overweight: Secondary | ICD-10-CM | POA: Diagnosis not present

## 2019-06-08 DIAGNOSIS — K219 Gastro-esophageal reflux disease without esophagitis: Secondary | ICD-10-CM | POA: Diagnosis not present

## 2019-06-08 DIAGNOSIS — Z008 Encounter for other general examination: Secondary | ICD-10-CM | POA: Diagnosis not present

## 2019-06-29 ENCOUNTER — Other Ambulatory Visit: Payer: Self-pay

## 2019-06-29 ENCOUNTER — Other Ambulatory Visit: Payer: Self-pay | Admitting: Family Medicine

## 2019-06-29 ENCOUNTER — Other Ambulatory Visit
Admission: RE | Admit: 2019-06-29 | Discharge: 2019-06-29 | Disposition: A | Discharge status: Home / Self Care | Payer: Medicare HMO | Source: Ambulatory Visit | Attending: Internal Medicine | Admitting: Internal Medicine

## 2019-06-29 DIAGNOSIS — K219 Gastro-esophageal reflux disease without esophagitis: Secondary | ICD-10-CM

## 2019-06-29 DIAGNOSIS — Z20822 Contact with and (suspected) exposure to covid-19: Secondary | ICD-10-CM | POA: Insufficient documentation

## 2019-06-29 DIAGNOSIS — Z01812 Encounter for preprocedural laboratory examination: Secondary | ICD-10-CM | POA: Diagnosis not present

## 2019-06-29 LAB — SARS CORONAVIRUS 2 (TAT 6-24 HRS): SARS Coronavirus 2: NEGATIVE

## 2019-07-03 ENCOUNTER — Ambulatory Visit: Payer: Medicare HMO | Admitting: Anesthesiology

## 2019-07-03 ENCOUNTER — Other Ambulatory Visit: Payer: Self-pay

## 2019-07-03 ENCOUNTER — Encounter
Admission: RE | Disposition: A | Discharge status: Home / Self Care | Payer: Self-pay | Source: Ambulatory Visit | Attending: Internal Medicine

## 2019-07-03 ENCOUNTER — Ambulatory Visit
Admission: RE | Admit: 2019-07-03 | Discharge: 2019-07-03 | Disposition: A | Discharge status: Home / Self Care | Payer: Medicare HMO | Source: Ambulatory Visit | Attending: Internal Medicine | Admitting: Internal Medicine

## 2019-07-03 ENCOUNTER — Encounter: Payer: Self-pay | Admitting: Internal Medicine

## 2019-07-03 DIAGNOSIS — R69 Illness, unspecified: Secondary | ICD-10-CM | POA: Diagnosis not present

## 2019-07-03 DIAGNOSIS — Z86010 Personal history of colonic polyps: Secondary | ICD-10-CM | POA: Diagnosis not present

## 2019-07-03 DIAGNOSIS — K219 Gastro-esophageal reflux disease without esophagitis: Secondary | ICD-10-CM | POA: Insufficient documentation

## 2019-07-03 DIAGNOSIS — K5730 Diverticulosis of large intestine without perforation or abscess without bleeding: Secondary | ICD-10-CM | POA: Diagnosis not present

## 2019-07-03 DIAGNOSIS — E785 Hyperlipidemia, unspecified: Secondary | ICD-10-CM | POA: Insufficient documentation

## 2019-07-03 DIAGNOSIS — I10 Essential (primary) hypertension: Secondary | ICD-10-CM | POA: Diagnosis not present

## 2019-07-03 DIAGNOSIS — F419 Anxiety disorder, unspecified: Secondary | ICD-10-CM | POA: Insufficient documentation

## 2019-07-03 DIAGNOSIS — K5790 Diverticulosis of intestine, part unspecified, without perforation or abscess without bleeding: Secondary | ICD-10-CM | POA: Diagnosis not present

## 2019-07-03 DIAGNOSIS — Z1211 Encounter for screening for malignant neoplasm of colon: Secondary | ICD-10-CM | POA: Insufficient documentation

## 2019-07-03 DIAGNOSIS — K640 First degree hemorrhoids: Secondary | ICD-10-CM | POA: Diagnosis not present

## 2019-07-03 DIAGNOSIS — D125 Benign neoplasm of sigmoid colon: Secondary | ICD-10-CM | POA: Diagnosis not present

## 2019-07-03 DIAGNOSIS — Z7689 Persons encountering health services in other specified circumstances: Secondary | ICD-10-CM | POA: Diagnosis not present

## 2019-07-03 DIAGNOSIS — Z85820 Personal history of malignant melanoma of skin: Secondary | ICD-10-CM | POA: Insufficient documentation

## 2019-07-03 DIAGNOSIS — K648 Other hemorrhoids: Secondary | ICD-10-CM | POA: Diagnosis not present

## 2019-07-03 DIAGNOSIS — Z853 Personal history of malignant neoplasm of breast: Secondary | ICD-10-CM | POA: Diagnosis not present

## 2019-07-03 DIAGNOSIS — K635 Polyp of colon: Secondary | ICD-10-CM | POA: Diagnosis not present

## 2019-07-03 HISTORY — PX: COLONOSCOPY WITH PROPOFOL: SHX5780

## 2019-07-03 LAB — HM COLONOSCOPY

## 2019-07-03 SURGERY — COLONOSCOPY WITH PROPOFOL
Anesthesia: General

## 2019-07-03 MED ORDER — LIDOCAINE HCL (PF) 2 % IJ SOLN
INTRAMUSCULAR | Status: AC
  Filled 2019-07-03: qty 5

## 2019-07-03 MED ORDER — PROPOFOL 500 MG/50ML IV EMUL
INTRAVENOUS | Status: DC | PRN
  Administered 2019-07-03: 50 mg via INTRAVENOUS
  Administered 2019-07-03: 80 mg via INTRAVENOUS

## 2019-07-03 MED ORDER — PROPOFOL 500 MG/50ML IV EMUL
INTRAVENOUS | Status: AC
  Filled 2019-07-03: qty 50

## 2019-07-03 MED ORDER — SODIUM CHLORIDE 0.9 % IV SOLN: INTRAVENOUS | Status: DC

## 2019-07-03 MED ORDER — LIDOCAINE HCL (CARDIAC) PF 100 MG/5ML IV SOSY
PREFILLED_SYRINGE | INTRAVENOUS | Status: DC | PRN
  Administered 2019-07-03: 100 mg via INTRAVENOUS

## 2019-07-03 NOTE — Op Note (Signed)
[facility] Gastroenterology Patient Name: Amber Galloway Procedure Date: 07/03/2019 1:15 PM MRN: [id number] Account #: [id number] Date of Birth: 08/23/1940 Admit Type: Outpatient Age: 79 Room: [facility] ENDO ROOM 3 Gender: Female Note Status: Finalized Procedure:             Colonoscopy Indications:           Surveillance: Personal history of adenomatous polyps                         on last colonoscopy > 5 years ago Providers:             Lorie Apley K. Sanaiya Welliver MD, MD Medicines:             Propofol per Anesthesia Complications:         No immediate complications. Procedure:             Pre-Anesthesia Assessment:                        - The risks and benefits of the procedure and the                         sedation options and risks were discussed with the                         patient. All questions were answered and informed                         consent was obtained.                        - Patient identification and proposed procedure were                         verified prior to the procedure by the nurse. The                         procedure was verified in the procedure room.                        - ASA Grade Assessment: III - A patient with severe                         systemic disease.                        - After reviewing the risks and benefits, the patient                         was deemed in satisfactory condition to undergo the                         procedure.                        After obtaining informed consent, the colonoscope was                         passed under direct vision. Throughout the procedure,  the patient's blood pressure, pulse, and oxygen                         saturations were monitored continuously. The                         Colonoscope was introduced through the anus and                         advanced to the the cecum, identified by appendiceal                         orifice and  ileocecal valve. The colonoscopy was                         performed without difficulty. The patient tolerated                         the procedure well. The quality of the bowel                         preparation was good. The ileocecal valve, appendiceal                         orifice, and rectum were photographed. Findings:      The perianal and digital rectal examinations were normal. Pertinent       negatives include normal sphincter tone.      A few medium-mouthed diverticula were found in the sigmoid colon.      A 4 mm polyp was found in the sigmoid colon. The polyp was sessile. The       polyp was removed with a jumbo cold forceps. Resection and retrieval       were complete.      Non-bleeding internal hemorrhoids were found during retroflexion. The       hemorrhoids were Grade I (internal hemorrhoids that do not prolapse).      The exam was otherwise without abnormality. Impression:            - Diverticulosis in the sigmoid colon.                        - One 4 mm polyp in the sigmoid colon, removed with a                         jumbo cold forceps. Resected and retrieved.                        - Non-bleeding internal hemorrhoids.                        - The examination was otherwise normal. Recommendation:        - Patient has a contact number available for                         emergencies. The signs and symptoms of potential                         delayed complications were discussed with the patient.  Return to normal activities tomorrow. Written                         discharge instructions were provided to the patient.                        - Resume previous diet.                        - Continue present medications.                        - If polyps are benign or adenomatous without                         dysplasia, I will advise NO further colonoscopy due to                         advanced age and/or severe comorbidity.                         - The findings and recommendations were discussed with                         the patient. Procedure Code(s):     --- Professional ---                        [phone], Colonoscopy, flexible; with biopsy, single or                         multiple Diagnosis Code(s):     --- Professional ---                        K57.30, Diverticulosis of large intestine without                         perforation or abscess without bleeding                        K63.5, Polyp of colon                        K64.0, First degree hemorrhoids                        Z86.010, Personal history of colonic polyps CPT copyright 2019 [employer] Association. All rights reserved. The codes documented in this report are preliminary and upon coder review may  be revised to meet current compliance requirements. Efrain Sella MD, MD 07/03/2019 1:44:01 PM This report has been signed electronically. Number of Addenda: 0 Note Initiated On: 07/03/2019 1:15 PM Scope Withdrawal Time: 0 hours 4 minutes 32 seconds  Total Procedure Duration: 0 hours 8 minutes 41 seconds  Estimated Blood Loss:  Estimated blood loss: none.      [facility]

## 2019-07-03 NOTE — Transfer of Care (Signed)
Immediate Anesthesia Transfer of Care Note  Patient: Amber Galloway  Procedure(s) Performed: COLONOSCOPY WITH PROPOFOL (N/A )  Patient Location: PACU  Anesthesia Type:General  Level of Consciousness: sedated and drowsy  Airway & Oxygen Therapy: Patient Spontanous Breathing and Patient connected to nasal cannula oxygen  Post-op Assessment: Report given to RN and Post -op Vital signs reviewed and stable  Post vital signs: Reviewed and stable  Last Vitals:  Vitals Value Taken Time  BP 145/72 07/03/19 1346  Temp 36 C 07/03/19 1346  Pulse 71 07/03/19 1346  Resp 17 07/03/19 1346  SpO2 100 % 07/03/19 1346  Vitals shown include unvalidated device data.  Last Pain:  Vitals:   07/03/19 1346  TempSrc: Tympanic  PainSc:          Complications: No complications documented.

## 2019-07-03 NOTE — H&P (Signed)
Outpatient short stay form Pre-procedure 07/03/2019 1:16 PM Kenzo Ozment K. Alice Reichert, M.D.  Primary Physician: Otilio Miu, M.D.  Reason for visit:  Personal hx of adenomatous colon polyps  History of present illness:                            Patient presents for colonoscopy for a personal hx of colon polyps. The patient denies abdominal pain, abnormal weight loss or rectal bleeding.      Current Facility-Administered Medications:  .  0.9 %  sodium chloride infusion, , Intravenous, Continuous, Kodah Maret, Benay Pike, MD  Medications Prior to Admission  Medication Sig Dispense Refill Last Dose  . aspirin EC 81 MG tablet Take 81 mg by mouth daily.   07/02/2019 at 0800  . cetirizine (ZYRTEC) 10 MG tablet Take 1 tablet (10 mg total) by mouth daily. 30 tablet 11 Past Month at Unknown time  . cyclobenzaprine (FLEXERIL) 10 MG tablet Take 1 tablet (10 mg total) by mouth 3 (three) times daily as needed for muscle spasms. 30 tablet 0 Past Month at Unknown time  . gemfibrozil (LOPID) 600 MG tablet Take 1 tablet (600 mg total) by mouth 2 (two) times daily before a meal. TAKE 1 TABLET BY MOUTH TWICE A DAY 180 tablet 1 Past Week at Unknown time  . hydrochlorothiazide (MICROZIDE) 12.5 MG capsule TAKE 1 CAPSULE BY MOUTH EVERY DAY 90 capsule 1 07/02/2019 at 0800  . latanoprost (XALATAN) 0.005 % ophthalmic solution Place 1 drop into both eyes at bedtime.  6 07/03/2019 at 0800  . meclizine (ANTIVERT) 25 MG tablet Take 1 tablet (25 mg total) by mouth 3 (three) times daily as needed for dizziness. 30 tablet 2 Past Week at Unknown time  . metoprolol tartrate (LOPRESSOR) 50 MG tablet Take 1 tablet (50 mg total) by mouth 2 (two) times daily. 180 tablet 1 07/03/2019 at 0800  . omeprazole (PRILOSEC) 20 MG capsule TAKE 1 CAPSULE BY MOUTH EVERY DAY 90 capsule 1 07/02/2019 at 0800  . simvastatin (ZOCOR) 80 MG tablet Take 1 tablet (80 mg total) by mouth daily. 90 tablet 1 07/02/2019 at 1800  . ALPRAZolam (XANAX) 0.25 MG tablet Take 1  tablet (0.25 mg total) by mouth 1 day or 1 dose. 30 tablet 1      No Known Allergies   Past Medical History:  Diagnosis Date  . Breast cancer ([place]) 08/2012   rt mastectomy  . GERD (gastroesophageal reflux disease)   . Hyperlipidemia   . Hypertension   . Melanoma ([place])     Review of systems:  Otherwise negative.    Physical Exam  Gen: Alert, oriented. Appears stated age.  HEENT: [place]/AT. PERRLA. Lungs: CTA, no wheezes. CV: RR nl S1, S2. Abd: soft, benign, no masses. BS+ Ext: No edema. Pulses 2+    Planned procedures: Proceed with colonoscopy. The patient understands the nature of the planned procedure, indications, risks, alternatives and potential complications including but not limited to bleeding, infection, perforation, damage to internal organs and possible oversedation/side effects from anesthesia. The patient agrees and gives consent to proceed.  Please refer to procedure notes for findings, recommendations and patient disposition/instructions.     Taline Nass K. Alice Reichert, M.D. Gastroenterology 07/03/2019  1:16 PM

## 2019-07-03 NOTE — Interval H&P Note (Signed)
History and Physical Interval Note:  07/03/2019 1:17 PM  Amber Galloway  has presented today for surgery, with the diagnosis of HISTORY OF COLON POLYPS.  The various methods of treatment have been discussed with the patient and family. After consideration of risks, benefits and other options for treatment, the patient has consented to  Procedure(s): COLONOSCOPY WITH PROPOFOL (N/A) as a surgical intervention.  The patient's history has been reviewed, patient examined, no change in status, stable for surgery.  I have reviewed the patient's chart and labs.  Questions were answered to the patient's satisfaction.     [place], [place]

## 2019-07-03 NOTE — Anesthesia Postprocedure Evaluation (Signed)
Anesthesia Post Note  Patient: Amber Galloway  Procedure(s) Performed: COLONOSCOPY WITH PROPOFOL (N/A )  Patient location during evaluation: Endoscopy Anesthesia Type: General Level of consciousness: awake and alert Pain management: pain level controlled Vital Signs Assessment: post-procedure vital signs reviewed and stable Respiratory status: spontaneous breathing, nonlabored ventilation, respiratory function stable and patient connected to nasal cannula oxygen Cardiovascular status: blood pressure returned to baseline and stable Postop Assessment: no apparent nausea or vomiting Anesthetic complications: no   No complications documented.   Last Vitals:  Vitals:   07/03/19 1420 07/03/19 1425  BP:  (!) 164/77  Pulse: 73 73  Resp: 14 19  Temp:    SpO2: 99% 98%    Last Pain:  Vitals:   07/03/19 1346  TempSrc: Tympanic  PainSc:                  [name]

## 2019-07-03 NOTE — Anesthesia Preprocedure Evaluation (Addendum)
Anesthesia Evaluation  Patient identified by MRN, date of birth, ID band Patient awake    Reviewed: Allergy & Precautions, H&P , NPO status , Patient's Chart, lab work & pertinent test results, reviewed documented beta blocker date and time   History of Anesthesia Complications Negative for: history of anesthetic complications  Airway Mallampati: I  TM Distance: >3 FB Neck ROM: full  Mouth opening: Limited Mouth Opening  Dental  (+) Dental Advidsory Given, Teeth Intact   Pulmonary neg pulmonary ROS,    Pulmonary exam normal breath sounds clear to auscultation       Cardiovascular Exercise Tolerance: Good hypertension, (-) angina+ CAD and + Past MI  (-) Cardiac Stents and (-) CABG Normal cardiovascular exam(-) dysrhythmias (-) Valvular Problems/Murmurs Rhythm:regular Rate:Normal     Neuro/Psych PSYCHIATRIC DISORDERS Anxiety negative neurological ROS     GI/Hepatic Neg liver ROS, GERD  ,  Endo/Other  negative endocrine ROS  Renal/GU negative Renal ROS  negative genitourinary   Musculoskeletal   Abdominal   Peds  Hematology negative hematology ROS (+)   Anesthesia Other Findings Past Medical History: 08/2012: Breast cancer ([place])     Comment:  rt mastectomy No date: GERD (gastroesophageal reflux disease) No date: Hyperlipidemia No date: Hypertension No date: Melanoma ([place])   Reproductive/Obstetrics negative OB ROS                             Anesthesia Physical Anesthesia Plan  ASA: II  Anesthesia Plan: General   Post-op Pain Management:    Induction: Intravenous  PONV Risk Score and Plan: 3 and Propofol infusion and TIVA  Airway Management Planned: Natural Airway and Nasal Cannula  Additional Equipment:   Intra-op Plan:   Post-operative Plan:   Informed Consent: I have reviewed the patients History and Physical, chart, labs and discussed the procedure including the risks,  benefits and alternatives for the proposed anesthesia with the patient or authorized representative who has indicated his/her understanding and acceptance.     Dental Advisory Given  Plan Discussed with: Anesthesiologist, CRNA and Surgeon  Anesthesia Plan Comments:        Anesthesia Quick Evaluation

## 2019-07-04 ENCOUNTER — Encounter: Payer: Self-pay | Admitting: Internal Medicine

## 2019-07-04 ENCOUNTER — Other Ambulatory Visit: Payer: Self-pay

## 2019-07-04 ENCOUNTER — Encounter: Payer: Self-pay | Admitting: Family Medicine

## 2019-07-05 LAB — SURGICAL PATHOLOGY

## 2019-08-03 DIAGNOSIS — R69 Illness, unspecified: Secondary | ICD-10-CM | POA: Diagnosis not present

## 2019-08-10 ENCOUNTER — Other Ambulatory Visit: Payer: Self-pay | Admitting: Family Medicine

## 2019-08-10 DIAGNOSIS — I10 Essential (primary) hypertension: Secondary | ICD-10-CM

## 2019-08-10 DIAGNOSIS — E782 Mixed hyperlipidemia: Secondary | ICD-10-CM

## 2019-08-15 DIAGNOSIS — H40023 Open angle with borderline findings, high risk, bilateral: Secondary | ICD-10-CM | POA: Diagnosis not present

## 2019-09-01 DIAGNOSIS — N9089 Other specified noninflammatory disorders of vulva and perineum: Secondary | ICD-10-CM | POA: Diagnosis not present

## 2019-09-01 DIAGNOSIS — R300 Dysuria: Secondary | ICD-10-CM | POA: Diagnosis not present

## 2019-09-01 DIAGNOSIS — N941 Unspecified dyspareunia: Secondary | ICD-10-CM | POA: Diagnosis not present

## 2019-10-03 ENCOUNTER — Telehealth: Payer: Self-pay

## 2019-10-03 ENCOUNTER — Ambulatory Visit (INDEPENDENT_AMBULATORY_CARE_PROVIDER_SITE_OTHER): Payer: Medicare HMO | Admitting: Family Medicine

## 2019-10-03 ENCOUNTER — Other Ambulatory Visit: Payer: Self-pay

## 2019-10-03 ENCOUNTER — Encounter: Payer: Self-pay | Admitting: Family Medicine

## 2019-10-03 VITALS — BP 120/80 | HR 88 | Ht 64.0 in | Wt 143.0 lb

## 2019-10-03 DIAGNOSIS — E782 Mixed hyperlipidemia: Secondary | ICD-10-CM | POA: Diagnosis not present

## 2019-10-03 DIAGNOSIS — Z23 Encounter for immunization: Secondary | ICD-10-CM | POA: Diagnosis not present

## 2019-10-03 DIAGNOSIS — Z1231 Encounter for screening mammogram for malignant neoplasm of breast: Secondary | ICD-10-CM

## 2019-10-03 DIAGNOSIS — I10 Essential (primary) hypertension: Secondary | ICD-10-CM | POA: Diagnosis not present

## 2019-10-03 DIAGNOSIS — K219 Gastro-esophageal reflux disease without esophagitis: Secondary | ICD-10-CM | POA: Diagnosis not present

## 2019-10-03 MED ORDER — METOPROLOL TARTRATE 50 MG PO TABS: 50.0000 mg | ORAL_TABLET | Freq: Two times a day (BID) | ORAL | 1 refills | Status: DC

## 2019-10-03 MED ORDER — HYDROCHLOROTHIAZIDE 12.5 MG PO CAPS: 12.5000 mg | ORAL_CAPSULE | Freq: Every day | ORAL | 1 refills | Status: DC

## 2019-10-03 MED ORDER — GEMFIBROZIL 600 MG PO TABS: 600.0000 mg | ORAL_TABLET | Freq: Every day | ORAL | 1 refills | Status: DC

## 2019-10-03 MED ORDER — OMEPRAZOLE 20 MG PO CPDR: DELAYED_RELEASE_CAPSULE | ORAL | 1 refills | Status: DC

## 2019-10-03 MED ORDER — SIMVASTATIN 80 MG PO TABS: 80.0000 mg | ORAL_TABLET | Freq: Every day | ORAL | 1 refills | Status: DC

## 2019-10-03 NOTE — Telephone Encounter (Signed)
Called with mammo appt 10/16/19 @ 1020 in [place]

## 2019-10-03 NOTE — Progress Notes (Signed)
Date:  10/03/2019   Name:  Amber Galloway   DOB:  1940-08-07   MRN:  [id number]   Chief Complaint: Flu Vaccine, breast exam, Gastroesophageal Reflux, Hyperlipidemia, and Hypertension  Gastroesophageal Reflux She complains of heartburn. She reports no abdominal pain, no belching, no chest pain, no choking, no coughing, no dysphagia, no early satiety, no globus sensation, no hoarse voice, no nausea, no sore throat, no stridor, no tooth decay, no water brash or no wheezing. This is a chronic problem. The current episode started more than 1 year ago. The problem occurs rarely. The symptoms are aggravated by certain foods. Pertinent negatives include no anemia, fatigue, melena, muscle weakness, orthopnea or weight loss. She has tried a PPI for the symptoms. The treatment provided moderate relief.  Hyperlipidemia This is a chronic problem. The current episode started more than 1 year ago. The problem is controlled. Recent lipid tests were reviewed and are normal. She has no history of chronic renal disease, diabetes, hypothyroidism, liver disease, obesity or nephrotic syndrome. Pertinent negatives include no chest pain, focal sensory loss, focal weakness, leg pain, myalgias or shortness of breath. Current antihyperlipidemic treatment includes statins. The current treatment provides moderate improvement of lipids. There are no compliance problems.   Hypertension This is a chronic problem. The current episode started more than 1 year ago. The problem has been gradually improving since onset. The problem is controlled. Pertinent negatives include no anxiety, blurred vision, chest pain, headaches, malaise/fatigue, neck pain, orthopnea, palpitations, peripheral edema, PND, shortness of breath or sweats. Risk factors for coronary artery disease include dyslipidemia. There is no history of angina, kidney disease, CAD/MI, CVA, heart failure, left ventricular hypertrophy, PVD or retinopathy. There is no history  of chronic renal disease, a hypertension causing med or renovascular disease.    Lab Results  Component Value Date   CREATININE 0.80 06/01/2019   BUN 19 06/01/2019   NA 136 06/01/2019   K 4.0 06/01/2019   CL 97 (L) 06/01/2019   CO2 28 06/01/2019   Lab Results  Component Value Date   CHOL 196 02/06/2019   HDL 62 02/06/2019   LDLCALC 117 (H) 02/06/2019   TRIG 96 02/06/2019   CHOLHDL 3.7 03/01/2017   No results found for: TSH Lab Results  Component Value Date   HGBA1C 5.8 (H) 08/19/2018   Lab Results  Component Value Date   WBC 7.3 02/06/2019   HGB 13.8 02/06/2019   HCT 40.0 02/06/2019   MCV 89 02/06/2019   PLT 206 02/06/2019   Lab Results  Component Value Date   ALT 15 06/01/2019   AST 25 06/01/2019   ALKPHOS 65 06/01/2019   BILITOT 0.8 06/01/2019     Review of Systems  Constitutional: Negative.  Negative for chills, fatigue, fever, malaise/fatigue, unexpected weight change and weight loss.  HENT: Negative for congestion, ear discharge, ear pain, hoarse voice, rhinorrhea, sinus pressure, sneezing and sore throat.   Eyes: Negative for blurred vision, photophobia, pain, discharge, redness and itching.  Respiratory: Negative for cough, choking, shortness of breath, wheezing and stridor.   Cardiovascular: Negative for chest pain, palpitations, orthopnea and PND.  Gastrointestinal: Positive for heartburn. Negative for abdominal pain, blood in stool, constipation, diarrhea, dysphagia, melena, nausea and vomiting.  Endocrine: Negative for cold intolerance, heat intolerance, polydipsia, polyphagia and polyuria.  Genitourinary: Negative for dysuria, flank pain, frequency, hematuria, menstrual problem, pelvic pain, urgency, vaginal bleeding and vaginal discharge.  Musculoskeletal: Negative for arthralgias, back pain, myalgias, muscle weakness and  neck pain.  Skin: Negative for rash.  Allergic/Immunologic: Negative for environmental allergies and food allergies.    Neurological: Negative for dizziness, focal weakness, weakness, light-headedness, numbness and headaches.  Hematological: Negative for adenopathy. Does not bruise/bleed easily.  Psychiatric/Behavioral: Negative for dysphoric mood. The patient is not nervous/anxious.     Patient Active Problem List   Diagnosis Date Noted  . Acute anxiety 08/10/2017  . Overweight (BMI 25.0-29.9) 03/01/2017  . Dizziness 06/05/2016  . Essential hypertension 06/06/2015  . Mixed hyperlipidemia 06/06/2015  . Gastroesophageal reflux disease without esophagitis 06/06/2015    No Known Allergies  Past Surgical History:  Procedure Laterality Date  . APPENDECTOMY    . CHOLECYSTECTOMY    . COLONOSCOPY  2014   cleared  . COLONOSCOPY WITH PROPOFOL N/A 07/03/2019   Procedure: COLONOSCOPY WITH PROPOFOL;  Surgeon: [place], Benay Pike, MD;  Location: [facility] ENDOSCOPY;  Service: Gastroenterology;  Laterality: N/A;  . MASTECTOMY Right 08/2012  . VAGINAL HYSTERECTOMY      Social History   Tobacco Use  . Smoking status: Never Smoker  . Smokeless tobacco: Never Used  Vaping Use  . Vaping Use: Never used  Substance Use Topics  . Alcohol use: No    Alcohol/week: 0.0 standard drinks  . Drug use: No     Medication list has been reviewed and updated.  Current Meds  Medication Sig  . aspirin EC 81 MG tablet Take 81 mg by mouth daily.  Marland Kitchen gemfibrozil (LOPID) 600 MG tablet Take 1 tablet (600 mg total) by mouth 2 (two) times daily before a meal. TAKE 1 TABLET BY MOUTH TWICE A DAY (Patient taking differently: Take 600 mg by mouth daily. TAKE 1 TABLET BY MOUTH once A DAY)  . hydrochlorothiazide (MICROZIDE) 12.5 MG capsule TAKE 1 CAPSULE BY MOUTH EVERY DAY  . latanoprost (XALATAN) 0.005 % ophthalmic solution Place 1 drop into both eyes at bedtime.  . metoprolol tartrate (LOPRESSOR) 50 MG tablet Take 1 tablet (50 mg total) by mouth 2 (two) times daily.  Marland Kitchen omeprazole (PRILOSEC) 20 MG capsule TAKE 1 CAPSULE BY MOUTH EVERY DAY   . simvastatin (ZOCOR) 80 MG tablet TAKE 1 TABLET BY MOUTH EVERY DAY    PHQ 2/9 Scores 02/06/2019 08/19/2018 05/06/2018 01/08/2017  PHQ - 2 Score 0 0 0 0  PHQ- 9 Score 0 0 0 0    GAD 7 : Generalized Anxiety Score 02/06/2019  Nervous, Anxious, on Edge 0  Control/stop worrying 0  Worry too much - different things 0  Trouble relaxing 0  Restless 0  Easily annoyed or irritable 0  Afraid - awful might happen 0  Total GAD 7 Score 0    BP Readings from Last 3 Encounters:  10/03/19 120/80  07/03/19 (!) 164/77  02/17/19 130/80    Physical Exam Vitals and nursing note reviewed.  Constitutional:      Appearance: She is well-developed.  HENT:     Head: Normocephalic.     Right Ear: External ear normal.     Left Ear: External ear normal.  Eyes:     General: Lids are everted, no foreign bodies appreciated. No scleral icterus.       Left eye: No foreign body or hordeolum.     Conjunctiva/sclera: Conjunctivae normal.     Right eye: Right conjunctiva is not injected.     Left eye: Left conjunctiva is not injected.     Pupils: Pupils are equal, round, and reactive to light.  Neck:  Thyroid: No thyromegaly.     Vascular: No JVD.     Trachea: No tracheal deviation.  Cardiovascular:     Rate and Rhythm: Normal rate and regular rhythm.     Heart sounds: Normal heart sounds. No murmur heard.  No friction rub. No gallop.   Pulmonary:     Effort: Pulmonary effort is normal. No respiratory distress.     Breath sounds: Normal breath sounds. No wheezing or rales.  Chest:     Breasts: Breasts are asymmetrical.        Right: Absent.        Left: No swelling, bleeding, inverted nipple, mass, nipple discharge, skin change or tenderness.  Abdominal:     General: Bowel sounds are normal.     Palpations: Abdomen is soft. There is no mass.     Tenderness: There is no abdominal tenderness. There is no guarding or rebound.  Musculoskeletal:        General: No tenderness. Normal range of motion.      Cervical back: Normal range of motion and neck supple.  Lymphadenopathy:     Cervical: No cervical adenopathy.     Upper Body:     Right upper body: No supraclavicular or axillary adenopathy.     Left upper body: No supraclavicular or axillary adenopathy.  Skin:    General: Skin is warm.     Findings: No rash.  Neurological:     Mental Status: She is alert and oriented to person, place, and time.     Cranial Nerves: No cranial nerve deficit.     Deep Tendon Reflexes: Reflexes normal.  Psychiatric:        Mood and Affect: Mood is not anxious or depressed.     Wt Readings from Last 3 Encounters:  10/03/19 143 lb (64.9 kg)  07/03/19 140 lb (63.5 kg)  02/17/19 143 lb (64.9 kg)    BP 120/80   Pulse 88   Ht 5\' 4"  (1.626 m)   Wt 143 lb (64.9 kg)   BMI 24.55 kg/m   Assessment and Plan: 1. Essential hypertension Chronic.  Controlled.  Stable.  Continue hydrochlorothiazide 12.5 mg today, and metoprolol 50 mg twice a day. - hydrochlorothiazide (MICROZIDE) 12.5 MG capsule; Take 1 capsule (12.5 mg total) by mouth daily.  Dispense: 90 capsule; Refill: 1 - metoprolol tartrate (LOPRESSOR) 50 MG tablet; Take 1 tablet (50 mg total) by mouth 2 (two) times daily.  Dispense: 180 tablet; Refill: 1  2. Mixed hyperlipidemia .  Controlled.  Stable.  Continue gemfibrozil 600 mg daily as well as simvastatin 80 mg once a day. - gemfibrozil (LOPID) 600 MG tablet; Take 1 tablet (600 mg total) by mouth daily. TAKE 1 TABLET BY MOUTH once A DAY  Dispense: 90 tablet; Refill: 1 - simvastatin (ZOCOR) 80 MG tablet; Take 1 tablet (80 mg total) by mouth daily.  Dispense: 90 tablet; Refill: 1  3. Gastroesophageal reflux disease without esophagitis .  Controlled.  Stable.  Continue Prilosec 20 mg once a day. - omeprazole (PRILOSEC) 20 MG capsule; TAKE 1 CAPSULE BY MOUTH EVERY DAY  Dispense: 90 capsule; Refill: 1  4. Breast cancer screening by mammogram Gust with patient and referral made for mammogram.   Breast exam of the left breast was unremarkable for breast mass or adenopathy axillary exam on the right is unremarkable for adenopathy. - MM 3D SCREEN BREAST BILATERAL; Future  5. Need for immunization against influenza Discussed and administered. - Flu Vaccine QUAD High Dose(Fluad)

## 2019-10-10 DIAGNOSIS — N9489 Other specified conditions associated with female genital organs and menstrual cycle: Secondary | ICD-10-CM | POA: Diagnosis not present

## 2019-10-10 DIAGNOSIS — N9089 Other specified noninflammatory disorders of vulva and perineum: Secondary | ICD-10-CM | POA: Diagnosis not present

## 2019-10-10 DIAGNOSIS — L292 Pruritus vulvae: Secondary | ICD-10-CM | POA: Diagnosis not present

## 2019-10-16 ENCOUNTER — Other Ambulatory Visit: Payer: Self-pay | Admitting: Family Medicine

## 2019-10-16 ENCOUNTER — Other Ambulatory Visit: Payer: Self-pay

## 2019-10-16 ENCOUNTER — Ambulatory Visit
Admission: RE | Admit: 2019-10-16 | Discharge: 2019-10-16 | Disposition: A | Discharge status: Home / Self Care | Payer: Medicare HMO | Source: Ambulatory Visit | Attending: Family Medicine | Admitting: Family Medicine

## 2019-10-16 DIAGNOSIS — Z1231 Encounter for screening mammogram for malignant neoplasm of breast: Secondary | ICD-10-CM | POA: Insufficient documentation

## 2019-10-25 ENCOUNTER — Ambulatory Visit (INDEPENDENT_AMBULATORY_CARE_PROVIDER_SITE_OTHER): Payer: Medicare HMO

## 2019-10-25 DIAGNOSIS — R102 Pelvic and perineal pain: Secondary | ICD-10-CM | POA: Insufficient documentation

## 2019-10-25 DIAGNOSIS — Z0000 Encounter for general adult medical examination without abnormal findings: Secondary | ICD-10-CM

## 2019-10-25 NOTE — Patient Instructions (Signed)
Amber Galloway , Thank you for taking time to come for your Medicare Wellness Visit. I appreciate your ongoing commitment to your health goals. Please review the following plan we discussed and let me know if I can assist you in the future.   Screening recommendations/referrals: Colonoscopy: no longer required Mammogram: done 10/16/19 Bone Density: done 03/08/17 Recommended yearly ophthalmology/optometry visit for glaucoma screening and checkup Recommended yearly dental visit for hygiene and checkup  Vaccinations: Influenza vaccine: done 10/03/19 Pneumococcal vaccine: done 03/01/17 Tdap vaccine: done 07/20/15 Shingles vaccine: will contact [facility] for records   Covid-19:done 05/11/19 & 06/06/19  Advanced directives: Advance directive discussed with you today. Even though you declined this today please call our office should you change your mind and we can give you the proper paperwork for you to fill out.  Conditions/risks identified: Keep up the great work!  Next appointment: Follow up in one year for your annual wellness visit    Preventive Care 65 Years and Older, Female Preventive care refers to lifestyle choices and visits with your health care provider that can promote health and wellness. What does preventive care include?  A yearly physical exam. This is also called an annual well check.  Dental exams once or twice a year.  Routine eye exams. Ask your health care provider how often you should have your eyes checked.  Personal lifestyle choices, including:  Daily care of your teeth and gums.  Regular physical activity.  Eating a healthy diet.  Avoiding tobacco and drug use.  Limiting alcohol use.  Practicing safe sex.  Taking low-dose aspirin every day.  Taking vitamin and mineral supplements as recommended by your health care provider. What happens during an annual well check? The services and screenings done by your health care provider during your annual well check will  depend on your age, overall health, lifestyle risk factors, and family history of disease. Counseling  Your health care provider may ask you questions about your:  Alcohol use.  Tobacco use.  Drug use.  Emotional well-being.  Home and relationship well-being.  Sexual activity.  Eating habits.  History of falls.  Memory and ability to understand (cognition).  Work and work Statistician.  Reproductive health. Screening  You may have the following tests or measurements:  Height, weight, and BMI.  Blood pressure.  Lipid and cholesterol levels. These may be checked every 5 years, or more frequently if you are over 57 years old.  Skin check.  Lung cancer screening. You may have this screening every year starting at age 70 if you have a 30-pack-year history of smoking and currently smoke or have quit within the past 15 years.  Fecal occult blood test (FOBT) of the stool. You may have this test every year starting at age 50.  Flexible sigmoidoscopy or colonoscopy. You may have a sigmoidoscopy every 5 years or a colonoscopy every 10 years starting at age 43.  Hepatitis C blood test.  Hepatitis B blood test.  Sexually transmitted disease (STD) testing.  Diabetes screening. This is done by checking your blood sugar (glucose) after you have not eaten for a while (fasting). You may have this done every 1-3 years.  Bone density scan. This is done to screen for osteoporosis. You may have this done starting at age 36.  Mammogram. This may be done every 1-2 years. Talk to your health care provider about how often you should have regular mammograms. Talk with your health care provider about your test results, treatment options, and  if necessary, the need for more tests. Vaccines  Your health care provider may recommend certain vaccines, such as:  Influenza vaccine. This is recommended every year.  Tetanus, diphtheria, and acellular pertussis (Tdap, Td) vaccine. You may need a  Td booster every 10 years.  Zoster vaccine. You may need this after age 77.  Pneumococcal 13-valent conjugate (PCV13) vaccine. One dose is recommended after age 44.  Pneumococcal polysaccharide (PPSV23) vaccine. One dose is recommended after age 68. Talk to your health care provider about which screenings and vaccines you need and how often you need them. This information is not intended to replace advice given to you by your health care provider. Make sure you discuss any questions you have with your health care provider. Document Released: 01/25/2015 Document Revised: 09/18/2015 Document Reviewed: 10/30/2014 Elsevier Interactive Patient Education  2017 [place] Prevention in the Home Falls can cause injuries. They can happen to people of all ages. There are many things you can do to make your home safe and to help prevent falls. What can I do on the outside of my home?  Regularly fix the edges of walkways and driveways and fix any cracks.  Remove anything that might make you trip as you walk through a door, such as a raised step or threshold.  Trim any bushes or trees on the path to your home.  Use bright outdoor lighting.  Clear any walking paths of anything that might make someone trip, such as rocks or tools.  Regularly check to see if handrails are loose or broken. Make sure that both sides of any steps have handrails.  Any raised decks and porches should have guardrails on the edges.  Have any leaves, snow, or ice cleared regularly.  Use sand or salt on walking paths during winter.  Clean up any spills in your garage right away. This includes oil or grease spills. What can I do in the bathroom?  Use night lights.  Install grab bars by the toilet and in the tub and shower. Do not use towel bars as grab bars.  Use non-skid mats or decals in the tub or shower.  If you need to sit down in the shower, use a plastic, non-slip stool.  Keep the floor dry. Clean  up any water that spills on the floor as soon as it happens.  Remove soap buildup in the tub or shower regularly.  Attach bath mats securely with double-sided non-slip rug tape.  Do not have throw rugs and other things on the floor that can make you trip. What can I do in the bedroom?  Use night lights.  Make sure that you have a light by your bed that is easy to reach.  Do not use any sheets or blankets that are too big for your bed. They should not hang down onto the floor.  Have a firm chair that has side arms. You can use this for support while you get dressed.  Do not have throw rugs and other things on the floor that can make you trip. What can I do in the kitchen?  Clean up any spills right away.  Avoid walking on wet floors.  Keep items that you use a lot in easy-to-reach places.  If you need to reach something above you, use a strong step stool that has a grab bar.  Keep electrical cords out of the way.  Do not use floor polish or wax that makes floors slippery. If you  must use wax, use non-skid floor wax.  Do not have throw rugs and other things on the floor that can make you trip. What can I do with my stairs?  Do not leave any items on the stairs.  Make sure that there are handrails on both sides of the stairs and use them. Fix handrails that are broken or loose. Make sure that handrails are as long as the stairways.  Check any carpeting to make sure that it is firmly attached to the stairs. Fix any carpet that is loose or worn.  Avoid having throw rugs at the top or bottom of the stairs. If you do have throw rugs, attach them to the floor with carpet tape.  Make sure that you have a light switch at the top of the stairs and the bottom of the stairs. If you do not have them, ask someone to add them for you. What else can I do to help prevent falls?  Wear shoes that:  Do not have high heels.  Have rubber bottoms.  Are comfortable and fit you well.  Are  closed at the toe. Do not wear sandals.  If you use a stepladder:  Make sure that it is fully opened. Do not climb a closed stepladder.  Make sure that both sides of the stepladder are locked into place.  Ask someone to hold it for you, if possible.  Clearly mark and make sure that you can see:  Any grab bars or handrails.  First and last steps.  Where the edge of each step is.  Use tools that help you move around (mobility aids) if they are needed. These include:  Canes.  Walkers.  Scooters.  Crutches.  Turn on the lights when you go into a dark area. Replace any light bulbs as soon as they burn out.  Set up your furniture so you have a clear path. Avoid moving your furniture around.  If any of your floors are uneven, fix them.  If there are any pets around you, be aware of where they are.  Review your medicines with your doctor. Some medicines can make you feel dizzy. This can increase your chance of falling. Ask your doctor what other things that you can do to help prevent falls. This information is not intended to replace advice given to you by your health care provider. Make sure you discuss any questions you have with your health care provider. Document Released: 10/25/2008 Document Revised: 06/06/2015 Document Reviewed: 02/02/2014 Elsevier Interactive Patient Education  2017 [employer].

## 2019-10-25 NOTE — Progress Notes (Signed)
Subjective:   Amber Galloway is a 79 y.o. female who presents for Medicare Annual (Subsequent) preventive examination.  Virtual Visit via Telephone Note  I connected with  Amber Galloway on 10/25/19 at 11:20 AM EDT by telephone and verified that I am speaking with the correct person using two identifiers.  Medicare Annual Wellness visit completed telephonically due to Covid-19 pandemic.   Location: Patient: home Provider: [facility]   I discussed the limitations, risks, security and privacy concerns of performing an evaluation and management service by telephone and the availability of in person appointments. The patient expressed understanding and agreed to proceed.  Unable to perform video visit due to video visit attempted and failed and/or patient does not have video capability.   Some vital signs may be absent or patient reported.   Clemetine Marker, LPN    Review of Systems     Cardiac Risk Factors include: advanced age (>27men, >63 women);hypertension;dyslipidemia     Objective:    There were no vitals filed for this visit. There is no height or weight on file to calculate BMI.  Advanced Directives 10/25/2019 07/03/2019 05/20/2015 11/05/2014  Does Patient Have a Medical Advance Directive? No No No No  Would patient like information on creating a medical advance directive? No - Patient declined No - Patient declined No - patient declined information No - patient declined information    Current Medications (verified) Outpatient Encounter Medications as of 10/25/2019  Medication Sig  . ALPRAZolam (XANAX) 0.25 MG tablet Take 1 tablet (0.25 mg total) by mouth 1 day or 1 dose.  Marland Kitchen aspirin EC 81 MG tablet Take 81 mg by mouth daily.  . cyclobenzaprine (FLEXERIL) 10 MG tablet Take 1 tablet (10 mg total) by mouth 3 (three) times daily as needed for muscle spasms.  Marland Kitchen gemfibrozil (LOPID) 600 MG tablet Take 1 tablet (600 mg total) by mouth daily. TAKE 1 TABLET BY MOUTH once A DAY    . hydrochlorothiazide (MICROZIDE) 12.5 MG capsule Take 1 capsule (12.5 mg total) by mouth daily.  Marland Kitchen latanoprost (XALATAN) 0.005 % ophthalmic solution Place 1 drop into both eyes at bedtime.  . meclizine (ANTIVERT) 25 MG tablet Take 1 tablet (25 mg total) by mouth 3 (three) times daily as needed for dizziness.  . metoprolol tartrate (LOPRESSOR) 50 MG tablet Take 1 tablet (50 mg total) by mouth 2 (two) times daily.  Marland Kitchen omeprazole (PRILOSEC) 20 MG capsule TAKE 1 CAPSULE BY MOUTH EVERY DAY  . simvastatin (ZOCOR) 80 MG tablet Take 1 tablet (80 mg total) by mouth daily.  Marland Kitchen triamcinolone ointment (KENALOG) 0.1 % Apply topically daily.  . cetirizine (ZYRTEC) 10 MG tablet Take 1 tablet (10 mg total) by mouth daily. (Patient not taking: Reported on 10/25/2019)   No facility-administered encounter medications on file as of 10/25/2019.    Allergies (verified) Patient has no known allergies.   History: Past Medical History:  Diagnosis Date  . Breast cancer ([place]) 08/2012   rt mastectomy  . GERD (gastroesophageal reflux disease)   . Hyperlipidemia   . Hypertension   . Melanoma [facility])    Past Surgical History:  Procedure Laterality Date  . APPENDECTOMY    . CHOLECYSTECTOMY    . COLONOSCOPY  2014   cleared  . COLONOSCOPY WITH PROPOFOL N/A 07/03/2019   Procedure: COLONOSCOPY WITH PROPOFOL;  Surgeon: Toledo, Benay Pike, MD;  Location: [facility] ENDOSCOPY;  Service: Gastroenterology;  Laterality: N/A;  . MASTECTOMY Right 08/2012  . VAGINAL HYSTERECTOMY  Family History  Problem Relation Age of Onset  . Breast cancer Sister 85  . Breast cancer Maternal Aunt 80   Social History   Socioeconomic History  . Marital status: Widowed    Spouse name: Not on file  . Number of children: Not on file  . Years of education: Not on file  . Highest education level: Not on file  Occupational History  . Not on file  Tobacco Use  . Smoking status: Never Smoker  . Smokeless tobacco: Never Used  Vaping Use  .  Vaping Use: Never used  Substance and Sexual Activity  . Alcohol use: No    Alcohol/week: 0.0 standard drinks  . Drug use: No  . Sexual activity: Never  Other Topics Concern  . Not on file  Social History Narrative   Pt lives alone   Social Determinants of Health   Financial Resource Strain: Low Risk   . Difficulty of Paying Living Expenses: Not very hard  Food Insecurity: No Food Insecurity  . Worried About Charity fundraiser in the Last Year: Never true  . Ran Out of Food in the Last Year: Never true  Transportation Needs: No Transportation Needs  . Lack of Transportation (Medical): No  . Lack of Transportation (Non-Medical): No  Physical Activity: Inactive  . Days of Exercise per Week: 0 days  . Minutes of Exercise per Session: 0 min  Stress: No Stress Concern Present  . Feeling of Stress : Not at all  Social Connections: Moderately Isolated  . Frequency of Communication with Friends and Family: More than three times a week  . Frequency of Social Gatherings with Friends and Family: Three times a week  . Attends Religious Services: More than 4 times per year  . Active Member of Clubs or Organizations: No  . Attends Archivist Meetings: Never  . Marital Status: Widowed    Tobacco Counseling Counseling given: Not Answered   Clinical Intake:  Pre-visit preparation completed: Yes  Pain : No/denies pain     Nutritional Risks: None Diabetes: No  How often do you need to have someone help you when you read instructions, pamphlets, or other written materials from your doctor or pharmacy?: 1 - Never    Interpreter Needed?: No  Information entered by :: Clemetine Marker LPN   Activities of Daily Living In your present state of health, do you have any difficulty performing the following activities: 10/25/2019  Hearing? N  Comment declines hearing aids  Vision? N  Difficulty concentrating or making decisions? N  Walking or climbing stairs? N  Dressing or  bathing? N  Doing errands, shopping? N  Preparing Food and eating ? N  Using the Toilet? N  In the past six months, have you accidently leaked urine? Y  Comment wears pads for protection  Do you have problems with loss of bowel control? N  Managing your Medications? N  Managing your Finances? N  Housekeeping or managing your Housekeeping? N  Some recent data might be hidden    Patient Care Team: Juline Patch, MD as PCP - General (Family Medicine)  Indicate any recent Medical Services you may have received from other than [facility] providers in the past year (date may be approximate).     Assessment:   This is a routine wellness examination for Amber Galloway.  Hearing/Vision screen  Hearing Screening   125Hz  250Hz  500Hz  1000Hz  2000Hz  3000Hz  4000Hz  6000Hz  8000Hz   Right ear:  Left ear:           Comments: Pt denies hearing difficulty  Vision Screening Comments: Annual vision screenings done at [facility] Dr. Mallie Mussel  Dietary issues and exercise activities discussed: Current Exercise Habits: The patient does not participate in regular exercise at present, Exercise limited by: None identified  Goals   None    Depression Screen PHQ 2/9 Scores 10/25/2019 02/06/2019 08/19/2018 05/06/2018 01/08/2017 06/06/2015 02/19/2015  PHQ - 2 Score 0 0 0 0 0 0 0  PHQ- 9 Score - 0 0 0 0 - -    Fall Risk Fall Risk  10/25/2019 02/06/2019 03/03/2018 01/08/2017 06/06/2015  Falls in the past year? 0 0 0 No No  Number falls in past yr: 0 - 0 - -  Injury with Fall? 0 - - - -  Risk for fall due to : No Fall Risks - - - -  Follow up Falls prevention discussed Falls evaluation completed - - -    Any stairs in or around the home? Yes  If so, are there any without handrails? No  Home free of loose throw rugs in walkways, pet beds, electrical cords, etc? Yes  Adequate lighting in your home to reduce risk of falls? Yes   ASSISTIVE DEVICES UTILIZED TO PREVENT FALLS:  Life alert? No  Use of a cane,  walker or w/c? No  Grab bars in the bathroom? Yes  Shower chair or bench in shower? No  Elevated toilet seat or a handicapped toilet? No   TIMED UP AND GO:  Was the test performed? No . Telephonic visit.   Cognitive Function: pt declined 6CIT        Immunizations Immunization History  Administered Date(s) Administered  . Fluad Quad(high Dose 65+) 09/12/2018, 10/03/2019  . Influenza, High Dose Seasonal PF 01/18/2017  . Influenza,inj,Quad PF,6+ Mos 11/05/2014, 11/11/2015, 09/14/2017  . Influenza-Unspecified 11/05/2014, 11/11/2015, 09/14/2017  . PFIZER SARS-COV-2 Vaccination 05/11/2019, 06/06/2019  . Pneumococcal Conjugate-13 11/05/2014  . Pneumococcal Polysaccharide-23 03/01/2017  . Tdap 07/20/2015    TDAP status: Up to date   Flu Vaccine status: Up to date   Pneumococcal vaccine status: Up to date   Covid-19 vaccine status: Completed vaccines  Qualifies for Shingles Vaccine? Yes   Zostavax completed No   Shingrix Completed?: Yes  Screening Tests Health Maintenance  Topic Date Due  . TETANUS/TDAP  07/19/2025  . INFLUENZA VACCINE  Completed  . DEXA SCAN  Completed  . COVID-19 Vaccine  Completed  . PNA vac Low Risk Adult  Completed    Health Maintenance  There are no preventive care reminders to display for this patient.  Colorectal cancer screening: No longer required.    Mammogram status: Completed 10/16/19. Repeat every year   Bone Density status: Completed 03/08/17. Results reflect: Bone density results: OSTEOPENIA. Repeat every 2 years.  Lung Cancer Screening: (Low Dose CT Chest recommended if Age 46-80 years, 30 pack-year currently smoking OR have quit w/in 15years.) does not qualify.    Additional Screening:  Hepatitis C Screening: does not qualify  Vision Screening: Recommended annual ophthalmology exams for early detection of glaucoma and other disorders of the eye. Is the patient up to date with their annual eye exam?  Yes  Who is the provider  or what is the name of the office in which the patient attends annual eye exams? Dr. Mallie Mussel   Dental Screening: Recommended annual dental exams for proper oral hygiene  Community Resource Referral / Chronic Care Management:  CRR required this visit?  No   CCM required this visit?  No      Plan:     I have personally reviewed and noted the following in the patient's chart:   . Medical and social history . Use of alcohol, tobacco or illicit drugs  . Current medications and supplements . Functional ability and status . Nutritional status . Physical activity . Advanced directives . List of other physicians . Hospitalizations, surgeries, and ER visits in previous 12 months . Vitals . Screenings to include cognitive, depression, and falls . Referrals and appointments  In addition, I have reviewed and discussed with patient certain preventive protocols, quality metrics, and best practice recommendations. A written personalized care plan for preventive services as well as general preventive health recommendations were provided to patient.     Clemetine Marker, LPN   [id number]   Nurse Notes: none

## 2019-11-16 DIAGNOSIS — H25013 Cortical age-related cataract, bilateral: Secondary | ICD-10-CM | POA: Diagnosis not present

## 2019-11-17 ENCOUNTER — Telehealth: Payer: Self-pay | Admitting: Family Medicine

## 2019-11-17 DIAGNOSIS — B379 Candidiasis, unspecified: Secondary | ICD-10-CM

## 2019-11-17 MED ORDER — FLUCONAZOLE 150 MG PO TABS: 150.0000 mg | ORAL_TABLET | Freq: Once | ORAL | 0 refills | Status: AC

## 2019-11-17 NOTE — Telephone Encounter (Signed)
Pt called to ask Baxter Flattery or Dr. Ronnald Ramp for an Rx for the one pill for yeast infections/ please advise

## 2019-11-17 NOTE — Telephone Encounter (Signed)
Called pt she has been taking amoxicillin from dentist. Pt states she needs meds for yeast infection. Told pt we will send in one pill and if it doesn't clear up we would need to see her in office. [facility] [place].  KP

## 2019-11-21 DIAGNOSIS — N9089 Other specified noninflammatory disorders of vulva and perineum: Secondary | ICD-10-CM | POA: Diagnosis not present

## 2019-11-21 DIAGNOSIS — R102 Pelvic and perineal pain: Secondary | ICD-10-CM | POA: Diagnosis not present

## 2019-11-21 DIAGNOSIS — N9489 Other specified conditions associated with female genital organs and menstrual cycle: Secondary | ICD-10-CM | POA: Diagnosis not present

## 2020-01-10 ENCOUNTER — Other Ambulatory Visit: Payer: Self-pay | Admitting: Family Medicine

## 2020-01-10 DIAGNOSIS — I10 Essential (primary) hypertension: Secondary | ICD-10-CM

## 2020-05-23 ENCOUNTER — Encounter: Payer: Self-pay | Admitting: Family Medicine

## 2020-05-23 ENCOUNTER — Other Ambulatory Visit: Payer: Self-pay

## 2020-05-23 ENCOUNTER — Ambulatory Visit (INDEPENDENT_AMBULATORY_CARE_PROVIDER_SITE_OTHER): Payer: Medicare HMO | Admitting: Family Medicine

## 2020-05-23 VITALS — BP 120/80 | HR 60 | Ht 64.0 in | Wt 146.0 lb

## 2020-05-23 DIAGNOSIS — R7303 Prediabetes: Secondary | ICD-10-CM | POA: Diagnosis not present

## 2020-05-23 DIAGNOSIS — I10 Essential (primary) hypertension: Secondary | ICD-10-CM

## 2020-05-23 DIAGNOSIS — E782 Mixed hyperlipidemia: Secondary | ICD-10-CM

## 2020-05-23 DIAGNOSIS — E663 Overweight: Secondary | ICD-10-CM

## 2020-05-23 DIAGNOSIS — K219 Gastro-esophageal reflux disease without esophagitis: Secondary | ICD-10-CM | POA: Diagnosis not present

## 2020-05-23 MED ORDER — METOPROLOL TARTRATE 50 MG PO TABS: 50.0000 mg | ORAL_TABLET | Freq: Two times a day (BID) | ORAL | 1 refills | Status: DC

## 2020-05-23 MED ORDER — HYDROCHLOROTHIAZIDE 12.5 MG PO CAPS: ORAL_CAPSULE | ORAL | 1 refills | Status: DC

## 2020-05-23 MED ORDER — GEMFIBROZIL 600 MG PO TABS: 600.0000 mg | ORAL_TABLET | Freq: Every day | ORAL | 1 refills | Status: DC

## 2020-05-23 MED ORDER — SIMVASTATIN 80 MG PO TABS: 80.0000 mg | ORAL_TABLET | Freq: Every day | ORAL | 1 refills | Status: DC

## 2020-05-23 MED ORDER — OMEPRAZOLE 20 MG PO CPDR: DELAYED_RELEASE_CAPSULE | ORAL | 1 refills | Status: DC

## 2020-05-23 NOTE — Patient Instructions (Signed)

## 2020-05-23 NOTE — Progress Notes (Signed)
Date:  05/23/2020   Name:  Amber Galloway   DOB:  08/31/1940   MRN:  [id number]   Chief Complaint: Gastroesophageal Reflux, Hyperlipidemia, Hypertension, and Prediabetes  Gastroesophageal Reflux She reports no abdominal pain, no chest pain, no coughing, no nausea, no sore throat or no wheezing. This is a chronic problem. The current episode started more than 1 year ago. The problem occurs rarely. The problem has been gradually improving. The symptoms are aggravated by certain foods. Pertinent negatives include no anemia, fatigue, melena, muscle weakness, orthopnea or weight loss. There are no known risk factors. She has tried a PPI for the symptoms. The treatment provided moderate relief.  Hyperlipidemia This is a chronic problem. The current episode started more than 1 year ago. The problem is controlled. Recent lipid tests were reviewed and are normal. She has no history of chronic renal disease, diabetes, hypothyroidism, liver disease, obesity or nephrotic syndrome. Pertinent negatives include no chest pain, focal sensory loss, focal weakness, leg pain, myalgias or shortness of breath. Current antihyperlipidemic treatment includes statins and fibric acid derivatives. The current treatment provides moderate improvement of lipids. There are no compliance problems.  Risk factors for coronary artery disease include diabetes mellitus, dyslipidemia and hypertension.  Hypertension This is a chronic problem. The current episode started more than 1 year ago. The problem has been gradually improving since onset. The problem is controlled. Pertinent negatives include no chest pain, headaches or shortness of breath. There are no associated agents to hypertension. Risk factors for coronary artery disease include dyslipidemia. Past treatments include beta blockers and diuretics. The current treatment provides moderate improvement. There are no compliance problems.  There is no history of chronic renal  disease, a hypertension causing med or renovascular disease.    Lab Results  Component Value Date   CREATININE 0.80 06/01/2019   BUN 19 06/01/2019   NA 136 06/01/2019   K 4.0 06/01/2019   CL 97 (L) 06/01/2019   CO2 28 06/01/2019   Lab Results  Component Value Date   CHOL 196 02/06/2019   HDL 62 02/06/2019   LDLCALC 117 (H) 02/06/2019   TRIG 96 02/06/2019   CHOLHDL 3.7 03/01/2017   No results found for: TSH Lab Results  Component Value Date   HGBA1C 5.8 (H) 08/19/2018   Lab Results  Component Value Date   WBC 7.3 02/06/2019   HGB 13.8 02/06/2019   HCT 40.0 02/06/2019   MCV 89 02/06/2019   PLT 206 02/06/2019   Lab Results  Component Value Date   ALT 15 06/01/2019   AST 25 06/01/2019   ALKPHOS 65 06/01/2019   BILITOT 0.8 06/01/2019     Review of Systems  Constitutional: Negative.  Negative for chills, fatigue, fever, unexpected weight change and weight loss.  HENT: Negative for congestion, ear discharge, ear pain, rhinorrhea, sinus pressure, sneezing and sore throat.   Eyes: Negative for photophobia, pain, discharge, redness and itching.  Respiratory: Negative for cough, shortness of breath, wheezing and stridor.   Cardiovascular: Negative for chest pain.  Gastrointestinal: Negative for abdominal pain, blood in stool, constipation, diarrhea, melena, nausea and vomiting.  Endocrine: Negative for cold intolerance, heat intolerance, polydipsia, polyphagia and polyuria.  Genitourinary: Negative for dysuria, flank pain, frequency, hematuria, menstrual problem, pelvic pain, urgency, vaginal bleeding and vaginal discharge.  Musculoskeletal: Negative for arthralgias, back pain, myalgias and muscle weakness.  Skin: Negative for rash.  Allergic/Immunologic: Negative for environmental allergies and food allergies.  Neurological: Negative for dizziness, focal  weakness, weakness, light-headedness, numbness and headaches.  Hematological: Negative for adenopathy. Does not  bruise/bleed easily.  Psychiatric/Behavioral: Negative for dysphoric mood. The patient is not nervous/anxious.     Patient Active Problem List   Diagnosis Date Noted  . Pelvic pain 10/25/2019  . Acute anxiety 08/10/2017  . Overweight (BMI 25.0-29.9) 03/01/2017  . Dizziness 06/05/2016  . Essential hypertension 06/06/2015  . Mixed hyperlipidemia 06/06/2015  . Gastroesophageal reflux disease without esophagitis 06/06/2015  . 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 APPENDECTOMY    . CHOLECYSTECTOMY    . COLONOSCOPY  2014   cleared  . COLONOSCOPY WITH PROPOFOL N/A 07/03/2019   Procedure: COLONOSCOPY WITH PROPOFOL;  Surgeon: Toledo, Benay Pike, MD;  Location: [facility] ENDOSCOPY;  Service: Gastroenterology;  Laterality: N/A;  . MASTECTOMY Right 08/2012  . VAGINAL HYSTERECTOMY      Social History   Tobacco Use  . Smoking status: Never Smoker  . Smokeless tobacco: Never Used  Vaping Use  . Vaping Use: Never used  Substance Use Topics  . Alcohol use: No    Alcohol/week: 0.0 standard drinks  . Drug use: No     Medication list has been reviewed and updated.  Current Meds  Medication Sig  . aspirin EC 81 MG tablet Take 81 mg by mouth daily.  Marland Kitchen gemfibrozil (LOPID) 600 MG tablet Take 1 tablet (600 mg total) by mouth daily. TAKE 1 TABLET BY MOUTH once A DAY  . hydrochlorothiazide (MICROZIDE) 12.5 MG capsule TAKE 1 CAPSULE BY MOUTH EVERY DAY  . latanoprost (XALATAN) 0.005 % ophthalmic solution Place 1 drop into both eyes at bedtime.  . meclizine (ANTIVERT) 25 MG tablet Take 1 tablet (25 mg total) by mouth 3 (three) times daily as needed for dizziness.  . metoprolol tartrate (LOPRESSOR) 50 MG tablet Take 1 tablet (50 mg total) by mouth 2 (two) times daily.  Marland Kitchen omeprazole (PRILOSEC) 20 MG capsule TAKE 1 CAPSULE BY MOUTH EVERY DAY  . simvastatin (ZOCOR) 80 MG tablet Take 1 tablet (80 mg total) by mouth daily.     PHQ 2/9 Scores 05/23/2020 10/25/2019 02/06/2019 08/19/2018  PHQ - 2 Score 0 0 0 0  PHQ- 9 Score 0 - 0 0    GAD 7 : Generalized Anxiety Score 05/23/2020 02/06/2019  Nervous, Anxious, on Edge 0 0  Control/stop worrying 0 0  Worry too much - different things 0 0  Trouble relaxing 0 0  Restless 0 0  Easily annoyed or irritable 0 0  Afraid - awful might happen 0 0  Total GAD 7 Score 0 0    BP Readings from Last 3 Encounters:  05/23/20 120/80  10/03/19 120/80  07/03/19 (!) 164/77    Physical Exam Vitals and nursing note reviewed.  Constitutional:      General: She is not in acute distress.    Appearance: She is not diaphoretic.  HENT:     Head: Normocephalic and atraumatic.     Right Ear: External ear normal.     Left Ear: External ear normal.     Nose: Nose normal. No congestion or rhinorrhea.     Mouth/Throat:     Mouth: Mucous membranes are moist.  Eyes:     General:        Right eye: No discharge.        Left eye: No discharge.     Conjunctiva/sclera: Conjunctivae normal.  Pupils: Pupils are equal, round, and reactive to light.  Neck:     Thyroid: No thyromegaly.     Vascular: No JVD.  Cardiovascular:     Rate and Rhythm: Normal rate and regular rhythm.     Heart sounds: Normal heart sounds. No murmur heard. No friction rub. No gallop.   Pulmonary:     Effort: Pulmonary effort is normal.     Breath sounds: Normal breath sounds. No wheezing, rhonchi or rales.  Chest:     Chest wall: No tenderness.  Abdominal:     General: Bowel sounds are normal.     Palpations: Abdomen is soft. There is no mass.     Tenderness: There is no abdominal tenderness. There is no guarding.  Musculoskeletal:        General: Normal range of motion.     Cervical back: Normal range of motion and neck supple.  Lymphadenopathy:     Cervical: No cervical adenopathy.  Skin:    General: Skin is warm and dry.  Neurological:     General: No focal deficit present.     Mental Status: She  is alert.     Deep Tendon Reflexes: Reflexes are normal and symmetric.     Wt Readings from Last 3 Encounters:  05/23/20 146 lb (66.2 kg)  10/03/19 143 lb (64.9 kg)  07/03/19 140 lb (63.5 kg)    BP 120/80   Pulse 60   Ht 5\' 4"  (1.626 m)   Wt 146 lb (66.2 kg)   BMI 25.06 kg/m   Assessment and Plan: 1. Essential hypertension Controlled.  Stable.  Blood pressure today is 120/80.  We will continue hydrochlorothiazide 12.5 mg once a day and metoprolol 50 mg 1 twice a day.  Will check CMP for electrolytes and GFR. - hydrochlorothiazide (MICROZIDE) 12.5 MG capsule; TAKE 1 CAPSULE BY MOUTH EVERY DAY  Dispense: 90 capsule; Refill: 1 - metoprolol tartrate (LOPRESSOR) 50 MG tablet; Take 1 tablet (50 mg total) by mouth 2 (two) times daily.  Dispense: 180 tablet; Refill: 1 - Comprehensive Metabolic Panel (CMET)  2. Mixed hyperlipidemia Chronic.  Controlled.  Stable.  Continue gemfibrozil 600 mg once a day and simvastatin 1 tablet 80 mg once a day.  Will check lipid panel for current status of LDL. - gemfibrozil (LOPID) 600 MG tablet; Take 1 tablet (600 mg total) by mouth daily. TAKE 1 TABLET BY MOUTH once A DAY  Dispense: 90 tablet; Refill: 1 - simvastatin (ZOCOR) 80 MG tablet; Take 1 tablet (80 mg total) by mouth daily.  Dispense: 90 tablet; Refill: 1 - Lipid Panel With LDL/HDL Ratio  3. Gastroesophageal reflux disease without esophagitis Chronic.  Controlled.  Stable.  Continue omeprazole 20 mg once a day. - omeprazole (PRILOSEC) 20 MG capsule; TAKE 1 CAPSULE BY MOUTH EVERY DAY  Dispense: 90 capsule; Refill: 1  4. Prediabetes Patient is encouraged to have weight loss and to limit concentrated sweets and carbohydrates.  She is also being given a Mediterranean diet for weight loss. - HgB A1c  5.  Overweight.  Mediterranean diet provided.

## 2020-05-24 LAB — COMPREHENSIVE METABOLIC PANEL
ALT: 14 IU/L (ref 0–32)
AST: 20 IU/L (ref 0–40)
Albumin/Globulin Ratio: 2.2 (ref 1.2–2.2)
Albumin: 5.1 g/dL — ABNORMAL HIGH (ref 3.6–4.6)
Alkaline Phosphatase: 84 IU/L (ref 44–121)
BUN/Creatinine Ratio: 15 (ref 12–28)
BUN: 12 mg/dL (ref 8–27)
Bilirubin Total: 0.6 mg/dL (ref 0.0–1.2)
CO2: 26 mmol/L (ref 20–29)
Calcium: 10.6 mg/dL — ABNORMAL HIGH (ref 8.7–10.3)
Chloride: 97 mmol/L (ref 96–106)
Creatinine, Ser: 0.82 mg/dL (ref 0.57–1.00)
Globulin, Total: 2.3 g/dL (ref 1.5–4.5)
Glucose: 130 mg/dL — ABNORMAL HIGH (ref 65–99)
Potassium: 4.1 mmol/L (ref 3.5–5.2)
Sodium: 139 mmol/L (ref 134–144)
Total Protein: 7.4 g/dL (ref 6.0–8.5)
eGFR: 72 mL/min/{1.73_m2} (ref >59)

## 2020-05-24 LAB — LIPID PANEL WITH LDL/HDL RATIO
Cholesterol, Total: 188 mg/dL (ref 100–199)
HDL: 57 mg/dL (ref >39)
LDL Chol Calc (NIH): 115 mg/dL — ABNORMAL HIGH (ref 0–99)
LDL/HDL Ratio: 2 ratio (ref 0.0–3.2)
Triglycerides: 87 mg/dL (ref 0–149)
VLDL Cholesterol Cal: 16 mg/dL (ref 5–40)

## 2020-05-24 LAB — HEMOGLOBIN A1C
Est. average glucose Bld gHb Est-mCnc: 148 mg/dL
Hgb A1c MFr Bld: 6.8 % — ABNORMAL HIGH (ref 4.8–5.6)

## 2020-06-03 ENCOUNTER — Other Ambulatory Visit: Payer: Self-pay

## 2020-06-03 ENCOUNTER — Ambulatory Visit (INDEPENDENT_AMBULATORY_CARE_PROVIDER_SITE_OTHER): Payer: Medicare HMO | Admitting: Family Medicine

## 2020-06-03 ENCOUNTER — Encounter: Payer: Self-pay | Admitting: Family Medicine

## 2020-06-03 VITALS — BP 120/60 | HR 80 | Ht 67.0 in | Wt 142.0 lb

## 2020-06-03 DIAGNOSIS — I10 Essential (primary) hypertension: Secondary | ICD-10-CM | POA: Diagnosis not present

## 2020-06-03 DIAGNOSIS — Z823 Family history of stroke: Secondary | ICD-10-CM | POA: Diagnosis not present

## 2020-06-03 DIAGNOSIS — K625 Hemorrhage of anus and rectum: Secondary | ICD-10-CM

## 2020-06-03 DIAGNOSIS — I872 Venous insufficiency (chronic) (peripheral): Secondary | ICD-10-CM | POA: Diagnosis not present

## 2020-06-03 DIAGNOSIS — I252 Old myocardial infarction: Secondary | ICD-10-CM | POA: Diagnosis not present

## 2020-06-03 DIAGNOSIS — R32 Unspecified urinary incontinence: Secondary | ICD-10-CM | POA: Diagnosis not present

## 2020-06-03 DIAGNOSIS — R10819 Abdominal tenderness, unspecified site: Secondary | ICD-10-CM

## 2020-06-03 DIAGNOSIS — Z7982 Long term (current) use of aspirin: Secondary | ICD-10-CM | POA: Diagnosis not present

## 2020-06-03 DIAGNOSIS — E785 Hyperlipidemia, unspecified: Secondary | ICD-10-CM | POA: Diagnosis not present

## 2020-06-03 DIAGNOSIS — H409 Unspecified glaucoma: Secondary | ICD-10-CM | POA: Diagnosis not present

## 2020-06-03 DIAGNOSIS — I2510 Atherosclerotic heart disease of native coronary artery without angina pectoris: Secondary | ICD-10-CM | POA: Diagnosis not present

## 2020-06-03 DIAGNOSIS — Z803 Family history of malignant neoplasm of breast: Secondary | ICD-10-CM | POA: Diagnosis not present

## 2020-06-03 DIAGNOSIS — K219 Gastro-esophageal reflux disease without esophagitis: Secondary | ICD-10-CM | POA: Diagnosis not present

## 2020-06-03 LAB — POCT URINALYSIS DIPSTICK
Bilirubin, UA: NEGATIVE
Blood, UA: NEGATIVE
Glucose, UA: NEGATIVE
Ketones, UA: NEGATIVE
Leukocytes, UA: NEGATIVE
Nitrite, UA: NEGATIVE
Protein, UA: NEGATIVE
Spec Grav, UA: 1.01 (ref 1.010–1.025)
Urobilinogen, UA: 0.2 E.U./dL
pH, UA: 5 (ref 5.0–8.0)

## 2020-06-03 NOTE — Progress Notes (Signed)
Date:  06/03/2020   Name:  Amber Galloway   DOB:  [birth year]   MRN:  [id number]   Chief Complaint: Rectal Bleeding (Bright red blood occasionally, has noticed some discoloration when passes gas. ) and Leg Swelling (L) leg swelling x 1 week- is now hurting in upper calf)  Patient is a 80 year old female who presents for a lower extremetry exam. The patient reports the following problems: with rectal bleeding. Health maintenance has been reviewed up to date  Rectal Bleeding  The current episode started more than 2 weeks ago (2-3 month). The onset was gradual. The problem has been gradually worsening. The pain is moderate. Stool description: BRBPR. There was no prior successful therapy. There was no prior unsuccessful therapy. Associated symptoms include abdominal pain, hemorrhoids and nausea. Pertinent negatives include no fever, no diarrhea, no hematemesis, no rectal pain, no vomiting, no hematuria, no vaginal bleeding, no vaginal discharge, no headaches, no coughing and no rash. Associated symptoms comments: Suprapubic abd pain.    Lab Results  Component Value Date   CREATININE 0.82 05/23/2020   BUN 12 05/23/2020   NA 139 05/23/2020   K 4.1 05/23/2020   CL 97 05/23/2020   CO2 26 05/23/2020   Lab Results  Component Value Date   CHOL 188 05/23/2020   HDL 57 05/23/2020   LDLCALC 115 (H) 05/23/2020   TRIG 87 05/23/2020   CHOLHDL 3.7 03/01/2017   No results found for: TSH Lab Results  Component Value Date   HGBA1C 6.8 (H) 05/23/2020   Lab Results  Component Value Date   WBC 7.3 02/06/2019   HGB 13.8 02/06/2019   HCT 40.0 02/06/2019   MCV 89 02/06/2019   PLT 206 02/06/2019   Lab Results  Component Value Date   ALT 14 05/23/2020   AST 20 05/23/2020   ALKPHOS 84 05/23/2020   BILITOT 0.6 05/23/2020     Review of Systems  Constitutional: Negative.  Negative for chills, fatigue, fever and unexpected weight change.  HENT: Negative for congestion, ear discharge, ear  pain, rhinorrhea, sinus pressure, sneezing and sore throat.   Eyes: Negative for photophobia, pain, discharge, redness and itching.  Respiratory: Negative for cough, shortness of breath, wheezing and stridor.   Gastrointestinal: Positive for abdominal pain, hematochezia, hemorrhoids and nausea. Negative for blood in stool, constipation, diarrhea, hematemesis, rectal pain and vomiting.  Endocrine: Negative for cold intolerance, heat intolerance, polydipsia, polyphagia and polyuria.  Genitourinary: Negative for dysuria, flank pain, frequency, hematuria, menstrual problem, pelvic pain, urgency, vaginal bleeding and vaginal discharge.  Musculoskeletal: Negative for arthralgias, back pain and myalgias.  Skin: Negative for rash.  Allergic/Immunologic: Negative for environmental allergies and food allergies.  Neurological: Negative for dizziness, weakness, light-headedness, numbness and headaches.  Hematological: Negative for adenopathy. Does not bruise/bleed easily.  Psychiatric/Behavioral: Negative for dysphoric mood. The patient is not nervous/anxious.     Patient Active Problem List   Diagnosis Date Noted  . Pelvic pain 10/25/2019  . Acute anxiety 08/10/2017  . Overweight (BMI 25.0-29.9) 03/01/2017  . Dizziness 06/05/2016  . Essential hypertension 06/06/2015  . Mixed hyperlipidemia 06/06/2015  . Gastroesophageal reflux disease without esophagitis 06/06/2015  . 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 APPENDECTOMY    . CHOLECYSTECTOMY    . COLONOSCOPY  2014   cleared  . COLONOSCOPY WITH PROPOFOL N/A 07/03/2019   Procedure: COLONOSCOPY WITH PROPOFOL;  Surgeon: [place],  Benay Pike, MD;  Location: [facility] ENDOSCOPY;  Service: Gastroenterology;  Laterality: N/A;  . MASTECTOMY Right 08/2012  . VAGINAL HYSTERECTOMY      Social History   Tobacco Use  . Smoking status: Never Smoker  . Smokeless tobacco: Never Used   Vaping Use  . Vaping Use: Never used  Substance Use Topics  . Alcohol use: No    Alcohol/week: 0.0 standard drinks  . Drug use: No     Medication list has been reviewed and updated.  Current Meds  Medication Sig  . ALPRAZolam (XANAX) 0.25 MG tablet Take 1 tablet (0.25 mg total) by mouth 1 day or 1 dose.  Marland Kitchen aspirin EC 81 MG tablet Take 81 mg by mouth daily.  . cetirizine (ZYRTEC) 10 MG tablet Take 1 tablet (10 mg total) by mouth daily.  Marland Kitchen gemfibrozil (LOPID) 600 MG tablet Take 1 tablet (600 mg total) by mouth daily. TAKE 1 TABLET BY MOUTH once A DAY  . hydrochlorothiazide (MICROZIDE) 12.5 MG capsule TAKE 1 CAPSULE BY MOUTH EVERY DAY  . latanoprost (XALATAN) 0.005 % ophthalmic solution Place 1 drop into both eyes at bedtime.  . meclizine (ANTIVERT) 25 MG tablet Take 1 tablet (25 mg total) by mouth 3 (three) times daily as needed for dizziness.  . metoprolol tartrate (LOPRESSOR) 50 MG tablet Take 1 tablet (50 mg total) by mouth 2 (two) times daily.  Marland Kitchen omeprazole (PRILOSEC) 20 MG capsule TAKE 1 CAPSULE BY MOUTH EVERY DAY  . simvastatin (ZOCOR) 80 MG tablet Take 1 tablet (80 mg total) by mouth daily.    PHQ 2/9 Scores 05/23/2020 10/25/2019 02/06/2019 08/19/2018  PHQ - 2 Score 0 0 0 0  PHQ- 9 Score 0 - 0 0    GAD 7 : Generalized Anxiety Score 05/23/2020 02/06/2019  Nervous, Anxious, on Edge 0 0  Control/stop worrying 0 0  Worry too much - different things 0 0  Trouble relaxing 0 0  Restless 0 0  Easily annoyed or irritable 0 0  Afraid - awful might happen 0 0  Total GAD 7 Score 0 0    BP Readings from Last 3 Encounters:  06/03/20 120/60  05/23/20 120/80  10/03/19 120/80    Physical Exam Vitals and nursing note reviewed.  Constitutional:      Appearance: She is well-developed.  HENT:     Head: Normocephalic.     Right Ear: Tympanic membrane, ear canal and external ear normal. There is no impacted cerumen.     Left Ear: Tympanic membrane, ear canal and external ear normal.  There is no impacted cerumen.     Nose: Nose normal.     Mouth/Throat:     Mouth: Mucous membranes are moist.  Eyes:     General: Lids are everted, no foreign bodies appreciated. No scleral icterus.       Left eye: No foreign body or hordeolum.     Conjunctiva/sclera: Conjunctivae normal.     Right eye: Right conjunctiva is not injected.     Left eye: Left conjunctiva is not injected.     Pupils: Pupils are equal, round, and reactive to light.  Neck:     Thyroid: No thyromegaly.     Vascular: No JVD.     Trachea: No tracheal deviation.  Cardiovascular:     Rate and Rhythm: Normal rate and regular rhythm.     Pulses: Normal pulses.     Heart sounds: Normal heart sounds, S1 normal and S2 normal. No murmur heard.  No systolic murmur is present.  No diastolic murmur is present. No friction rub. No gallop. No S3 or S4 sounds.   Pulmonary:     Effort: Pulmonary effort is normal. No respiratory distress.     Breath sounds: Normal breath sounds. No wheezing, rhonchi or rales.  Abdominal:     General: Bowel sounds are normal.     Palpations: Abdomen is soft. There is no mass.     Tenderness: There is abdominal tenderness in the suprapubic area. There is no guarding or rebound.     Comments: mild  Genitourinary:    Rectum: Guaiac result negative. External hemorrhoid present. No mass, tenderness or internal hemorrhoid. Normal anal tone.  Musculoskeletal:        General: No tenderness. Normal range of motion.     Cervical back: Normal range of motion and neck supple.     Right lower leg: 1+ Pitting Edema present.     Left lower leg: 1+ Pitting Edema present.  Lymphadenopathy:     Cervical: No cervical adenopathy.  Skin:    General: Skin is warm.     Findings: No rash.  Neurological:     Mental Status: She is alert and oriented to person, place, and time.     Cranial Nerves: No cranial nerve deficit.     Deep Tendon Reflexes: Reflexes normal.  Psychiatric:        Mood and Affect:  Mood is not anxious or depressed.     Wt Readings from Last 3 Encounters:  06/03/20 142 lb (64.4 kg)  05/23/20 146 lb (66.2 kg)  10/03/19 143 lb (64.9 kg)    BP 120/60   Pulse 80   Ht 5\' 7"  (1.702 m)   Wt 142 lb (64.4 kg)   BMI 22.24 kg/m   Assessment and Plan:  1. Venous insufficiency New onset.  Episodic.  Stable.  Minimal edema at 1+ range bilateral with no calf tenderness or swelling.  This is concerning consistent with venous insufficiency and information has been given and suggestion of wearing compression stockings has been made.  If this does continue our next step would be to refer to vein and vascular.  2. Rectal bleeding New onset.  Patient has noted that there is some blood on the tissue and that there has been when she passes flatus that there may be a little dark staining her pain in the area.  Patient recently had a colonoscopy by Dr. Alice Reichert at which there were noted diverticula, internal hemorrhoids and external hemorrhoids.  Any 1 of these could be consistent with and we will check a hemoglobin to see if this is causing any decrease in counts at this time.  In the meantime rectal exam was done and it was guaiac negative but 3 more Hemoccults were given to be checked on 3 different occasions and will recheck at that time. - Hemoglobin  3. Suprapubic tenderness There is mild tenderness and we did a urinalysis which was unremarkable. - POCT urinalysis dipstick

## 2020-06-03 NOTE — Patient Instructions (Signed)

## 2020-06-12 ENCOUNTER — Other Ambulatory Visit: Payer: Self-pay

## 2020-06-12 ENCOUNTER — Other Ambulatory Visit (INDEPENDENT_AMBULATORY_CARE_PROVIDER_SITE_OTHER): Payer: Medicare HMO

## 2020-06-12 DIAGNOSIS — K625 Hemorrhage of anus and rectum: Secondary | ICD-10-CM | POA: Diagnosis not present

## 2020-06-12 LAB — HEMOCCULT GUIAC POC 1CARD (OFFICE)
Card #2 Fecal Occult Blod, POC: NEGATIVE
Card #3 Fecal Occult Blood, POC: NEGATIVE
Fecal Occult Blood, POC: NEGATIVE

## 2020-06-14 ENCOUNTER — Other Ambulatory Visit: Payer: Self-pay

## 2020-06-14 ENCOUNTER — Telehealth: Payer: Self-pay

## 2020-06-14 DIAGNOSIS — M25562 Pain in left knee: Secondary | ICD-10-CM

## 2020-06-14 MED ORDER — MELOXICAM 7.5 MG PO TABS: 7.5000 mg | ORAL_TABLET | Freq: Every day | ORAL | 0 refills | Status: DC

## 2020-06-14 NOTE — Progress Notes (Signed)
Sent in meloxicam

## 2020-06-14 NOTE — Telephone Encounter (Unsigned)
Copied from [place] [id number]. Topic: General - Other >> Jun 14, 2020  9:14 AM Keene Breath wrote: Reason for CRM: Patient called to speak with Baxter Flattery regarding a referral that the doctor spoke about at her last appt.  Please call to discuss at [phone]

## 2020-06-14 NOTE — Telephone Encounter (Signed)
Sent in meloxicam and scheduled for [facility]

## 2020-06-20 ENCOUNTER — Ambulatory Visit
Admission: RE | Admit: 2020-06-20 | Discharge: 2020-06-20 | Disposition: A | Discharge status: Home / Self Care | Payer: Medicare HMO | Source: Ambulatory Visit | Attending: Family Medicine | Admitting: Family Medicine

## 2020-06-20 ENCOUNTER — Ambulatory Visit (INDEPENDENT_AMBULATORY_CARE_PROVIDER_SITE_OTHER): Payer: Medicare HMO | Admitting: Family Medicine

## 2020-06-20 ENCOUNTER — Encounter: Payer: Self-pay | Admitting: Family Medicine

## 2020-06-20 ENCOUNTER — Other Ambulatory Visit: Payer: Self-pay

## 2020-06-20 ENCOUNTER — Ambulatory Visit
Admission: RE | Admit: 2020-06-20 | Discharge: 2020-06-20 | Disposition: A | Discharge status: Home / Self Care | Payer: Medicare HMO | Attending: Family Medicine | Admitting: Family Medicine

## 2020-06-20 VITALS — BP 140/68 | HR 76 | Temp 98.1°F | Ht 67.0 in | Wt 141.2 lb

## 2020-06-20 DIAGNOSIS — M25562 Pain in left knee: Secondary | ICD-10-CM

## 2020-06-20 DIAGNOSIS — M7989 Other specified soft tissue disorders: Secondary | ICD-10-CM

## 2020-06-20 DIAGNOSIS — M1712 Unilateral primary osteoarthritis, left knee: Secondary | ICD-10-CM | POA: Insufficient documentation

## 2020-06-20 DIAGNOSIS — M79662 Pain in left lower leg: Secondary | ICD-10-CM | POA: Diagnosis not present

## 2020-06-20 NOTE — Progress Notes (Signed)
Primary Care / Sports Medicine Office Visit  Patient Information:  Patient ID: Amber Galloway, female DOB: 03-18-1940 Age: 80 y.o. MRN: [id number]   Amber Galloway is a pleasant 80 y.o. female presenting with the following:  Chief Complaint  Patient presents with   New Patient (Initial Visit)   Knee Pain    Left, acute; x2 weeks ago; swelling associated, some pitting; no known injury; no imaging; evaluated by Dr. Ronnald Ramp for possible venous insufficiency; pain worse medially per patient; focal, sharp, and stinging; rest reduces pain; taking Meloxicam with no relief; 3/10 pain    Review of Systems pertinent details above   Patient Active Problem List   Diagnosis Date Noted   Acute pain of left knee 06/20/2020   Pain and swelling of left lower leg 06/20/2020   Pelvic pain 10/25/2019   Acute anxiety 08/10/2017   Overweight (BMI 25.0-29.9) 03/01/2017   Dizziness 06/05/2016   Essential hypertension 06/06/2015   Mixed hyperlipidemia 06/06/2015   Gastroesophageal reflux disease without esophagitis 06/06/2015   History of melanoma in situ 05/21/2014   Past Medical History:  Diagnosis Date   Breast cancer ([place]) 08/2012   rt mastectomy   GERD (gastroesophageal reflux disease)    Hyperlipidemia    Hypertension    Melanoma ([place])    Myocardial infarction [facility]) 1991   Outpatient Encounter Medications as of 06/20/2020  Medication Sig   ALPRAZolam (XANAX) 0.25 MG tablet Take 1 tablet (0.25 mg total) by mouth 1 day or 1 dose.   aspirin EC 81 MG tablet Take 81 mg by mouth daily.   cetirizine (ZYRTEC) 10 MG tablet Take 1 tablet (10 mg total) by mouth daily.   gemfibrozil (LOPID) 600 MG tablet Take 1 tablet (600 mg total) by mouth daily. TAKE 1 TABLET BY MOUTH once A DAY   hydrochlorothiazide (MICROZIDE) 12.5 MG capsule TAKE 1 CAPSULE BY MOUTH EVERY DAY   latanoprost (XALATAN) 0.005 % ophthalmic solution Place 1 drop into both eyes at bedtime.   meclizine (ANTIVERT) 25 MG tablet  Take 1 tablet (25 mg total) by mouth 3 (three) times daily as needed for dizziness.   meloxicam (MOBIC) 7.5 MG tablet Take 1 tablet (7.5 mg total) by mouth daily.   metoprolol tartrate (LOPRESSOR) 50 MG tablet Take 1 tablet (50 mg total) by mouth 2 (two) times daily.   omeprazole (PRILOSEC) 20 MG capsule TAKE 1 CAPSULE BY MOUTH EVERY DAY   simvastatin (ZOCOR) 80 MG tablet Take 1 tablet (80 mg total) by mouth daily.   [DISCONTINUED] triamcinolone ointment (KENALOG) 0.1 % Apply topically daily. (Patient not taking: No sig reported)   No facility-administered encounter medications on file as of 06/20/2020.   Past Surgical History:  Procedure Laterality Date   APPENDECTOMY     CHOLECYSTECTOMY     COLONOSCOPY  2014   cleared   COLONOSCOPY WITH PROPOFOL N/A 07/03/2019   Procedure: COLONOSCOPY WITH PROPOFOL;  Surgeon: Toledo, Benay Pike, MD;  Location: [facility] ENDOSCOPY;  Service: Gastroenterology;  Laterality: N/A;   MASTECTOMY Right 08/2012   VAGINAL HYSTERECTOMY      Vitals:   06/20/20 0922  BP: 140/68  Pulse: 76  Temp: 98.1 F (36.7 C)  SpO2: 98%   Vitals:   06/20/20 0922  Weight: 141 lb 3.2 oz (64 kg)  Height: 5\' 7"  (1.702 m)   Body mass index is 22.12 kg/m.  No results found.   Independent interpretation of notes and tests performed by another provider:  None  Procedures performed:   Procedure: Limited Diagnostic Ultrasound of left knee Device: Samsung [id number] Findings: No suprapatellar effusion noted, significant synovial thickening noted, popliteal fossa assessed, revealing no evidence of popliteal cyst Images permanently stored and available for review in PACS.  Impression: Synovial thickening of the left knee capsule  Pertinent History, Exam, Impression, and Recommendations:   Acute pain of left knee 80 year old female presenting with few weeks history of left below-knee swelling with roughly 1-2 weeks history of primarily anterior knee pain associated with knee  swelling.  This pain is noted with ambulation, stairs, denies any instability.  Has had on and off isolated left below-knee swelling for years.  She denies any type of calf pain, skin color change, paresthesias.  She was recently started on meloxicam by her PCP Dr. Ronnald Ramp and has noted improvement following this though without resolution of symptoms.  Physical exam findings reveals 1-2+ knee swelling without clear evidence of effusion, tenderness at the inferomedial patellar facet, medial joint line greater than lateral joint line, no laxity on AP and varus/valgus stressing, calf is nontender, Homans signs negative, neurovascular intact distally.  High clinical suspicion for osteoarthritis related arthralgia and swelling, will obtain x-rays of the left knee to further evaluate this.  In the interim I have advised her to continue the meloxicam (the nature of this medication, short versus long-term usage, and appropriate dosing were discussed at length) to finish out 1 full week which will be this Friday, following that she is to continue dosing once daily on an as-needed basis.  Additionally, supportive care measures were discussed.  She will return for follow-up in 2 weeks.  She may be considered a good candidate for viscosupplementation for long-term symptom control.  Pain and swelling of left lower leg This is a reported long-term issue going on for years with intermittent episodes of flare.  She denies any trauma but endorses possible overuse preceding these episodes.  She has never noted any calf pain, increased varicosities, skin discoloration, or paresthesias.  These episodes are usually self-limited responding to time, relative rest.  I clinical suspicion for osteoarthritis related knee swelling altering gait mechanics versus resulting in popliteal cyst formation altering vascular dynamics in the left lower leg as a contributory factor to her symptoms.  Pending further evaluation management for knee  arthralgia, can consider additional vascular etiologies as clinically guided.    Orders & Medications No orders of the defined types were placed in this encounter.  Orders Placed This Encounter  Procedures   DG Knee Complete 4 Views Left      Return in about 2 weeks (around 07/04/2020).     Montel Culver, MD   Primary Care Sports Medicine [place]

## 2020-06-20 NOTE — Patient Instructions (Signed)
-   Continue meloxicam until this Friday then dose once daily as-needed for knee pain / swelling - Obtain x-rays with order provided - Activity as tolerated, ice and elevate the knee as discussed - Return in 2 weeks, call for questions

## 2020-06-20 NOTE — Assessment & Plan Note (Signed)
This is a reported long-term issue going on for years with intermittent episodes of flare.  She denies any trauma but endorses possible overuse preceding these episodes.  She has never noted any calf pain, increased varicosities, skin discoloration, or paresthesias.  These episodes are usually self-limited responding to time, relative rest.  I clinical suspicion for osteoarthritis related knee swelling altering gait mechanics versus resulting in popliteal cyst formation altering vascular dynamics in the left lower leg as a contributory factor to her symptoms.  Pending further evaluation management for knee arthralgia, can consider additional vascular etiologies as clinically guided.

## 2020-06-20 NOTE — Assessment & Plan Note (Addendum)
80 year old female presenting with few weeks history of left below-knee swelling with roughly 1-2 weeks history of primarily anterior knee pain associated with knee swelling.  This pain is noted with ambulation, stairs, denies any instability.  Has had on and off isolated left below-knee swelling for years.  She denies any type of calf pain, skin color change, paresthesias.  She was recently started on meloxicam by her PCP Dr. Ronnald Ramp and has noted improvement following this though without resolution of symptoms.  Physical exam findings reveals 1-2+ knee swelling without clear evidence of effusion, tenderness at the inferomedial patellar facet, medial joint line greater than lateral joint line, no laxity on AP and varus/valgus stressing, calf is nontender, Homans signs negative, neurovascular intact distally.  High clinical suspicion for osteoarthritis related arthralgia and swelling, will obtain x-rays of the left knee to further evaluate this.  In the interim I have advised her to continue the meloxicam (the nature of this medication, short versus long-term usage, and appropriate dosing were discussed at length) to finish out 1 full week which will be this Friday, following that she is to continue dosing once daily on an as-needed basis.  Additionally, supportive care measures were discussed.  She will return for follow-up in 2 weeks.  She may be considered a good candidate for viscosupplementation for long-term symptom control.

## 2020-06-21 ENCOUNTER — Ambulatory Visit (INDEPENDENT_AMBULATORY_CARE_PROVIDER_SITE_OTHER): Payer: Medicare HMO | Admitting: Family Medicine

## 2020-06-21 ENCOUNTER — Encounter: Payer: Self-pay | Admitting: Family Medicine

## 2020-06-21 ENCOUNTER — Ambulatory Visit: Payer: Self-pay | Admitting: *Deleted

## 2020-06-21 VITALS — BP 120/60 | HR 60 | Ht 67.0 in | Wt 142.0 lb

## 2020-06-21 DIAGNOSIS — R319 Hematuria, unspecified: Secondary | ICD-10-CM | POA: Diagnosis not present

## 2020-06-21 LAB — POCT URINALYSIS DIPSTICK
Bilirubin, UA: NEGATIVE
Blood, UA: NEGATIVE
Glucose, UA: NEGATIVE
Ketones, UA: NEGATIVE
Leukocytes, UA: NEGATIVE
Nitrite, UA: NEGATIVE
Protein, UA: NEGATIVE
Spec Grav, UA: 1.01 (ref 1.010–1.025)
Urobilinogen, UA: 0.2 E.U./dL
pH, UA: 5 (ref 5.0–8.0)

## 2020-06-21 NOTE — Addendum Note (Signed)
Addended by: Juline Patch on: 06/21/2020 10:44 AM   Modules accepted: Orders

## 2020-06-21 NOTE — Telephone Encounter (Signed)
Reason for Disposition  Blood in urine  (Exception: could be normal menstrual bleeding)  Answer Assessment - Initial Assessment Questions 1. COLOR of URINE: "Describe the color of the urine."  (e.g., tea-colored, pink, red, blood clots, bloody)     Patient is seeing blood with urination 2. ONSET: "When did the bleeding start?"      Last night before bedtime 3. EPISODES: "How many times has there been blood in the urine?" or "How many times today?"     3 times during the night and this morning-2 4. PAIN with URINATION: "Is there any pain with passing your urine?" If Yes, ask: "How bad is the pain?"  (Scale 1-10; or mild, moderate, severe)    - MILD - complains slightly about urination hurting    - MODERATE - interferes with normal activities      - SEVERE - excruciating, unwilling or unable to urinate because of the pain      no 5. FEVER: "Do you have a fever?" If Yes, ask: "What is your temperature, how was it measured, and when did it start?"     no 6. ASSOCIATED SYMPTOMS: "Are you passing urine more frequently than usual?"    Abdominal pressure 7. OTHER SYMPTOMS: "Do you have any other symptoms?" (e.g., back/flank pain, abdominal pain, vomiting)     no 8. PREGNANCY: "Is there any chance you are pregnant?" "When was your last menstrual period?"     N/a  Protocols used: Urine - Blood In-A-AH

## 2020-06-21 NOTE — Progress Notes (Signed)
Date:  06/21/2020   Name:  Amber Galloway   DOB:  1940-11-21   MRN:  [id number]   Chief Complaint: Hematuria (Started last night with dark red blood in urine x 3 )  Hematuria This is a new problem. The current episode started today (last night x 3). The problem has been gradually improving since onset. She describes the hematuria as gross hematuria. She is experiencing no pain. She describes her urine color as light pink. Irritative symptoms do not include frequency, nocturia or urgency. Obstructive symptoms include dribbling. Obstructive symptoms do not include incomplete emptying, an intermittent stream, a slower stream, straining or a weak stream. Pertinent negatives include no abdominal pain, bladder pain, chills, dysuria, fever, genital pain, hematospermia, hesitancy, inability to urinate, nausea, urinary retention, vomiting or weight loss. (Stress  incontinence history of bladder ulcers) There is no history of hypertension, kidney stones or tobacco use.   Lab Results  Component Value Date   CREATININE 0.82 05/23/2020   BUN 12 05/23/2020   NA 139 05/23/2020   K 4.1 05/23/2020   CL 97 05/23/2020   CO2 26 05/23/2020   Lab Results  Component Value Date   CHOL 188 05/23/2020   HDL 57 05/23/2020   LDLCALC 115 (H) 05/23/2020   TRIG 87 05/23/2020   CHOLHDL 3.7 03/01/2017   No results found for: TSH Lab Results  Component Value Date   HGBA1C 6.8 (H) 05/23/2020   Lab Results  Component Value Date   WBC 7.3 02/06/2019   HGB 13.8 02/06/2019   HCT 40.0 02/06/2019   MCV 89 02/06/2019   PLT 206 02/06/2019   Lab Results  Component Value Date   ALT 14 05/23/2020   AST 20 05/23/2020   ALKPHOS 84 05/23/2020   BILITOT 0.6 05/23/2020     Review of Systems  Constitutional:  Negative for chills, fever and weight loss.  HENT:  Negative for drooling, ear discharge, ear pain and sore throat.   Respiratory:  Negative for cough, shortness of breath and wheezing.    Cardiovascular:  Negative for chest pain, palpitations and leg swelling.  Gastrointestinal:  Negative for abdominal pain, blood in stool, constipation, diarrhea, nausea and vomiting.  Endocrine: Negative for polydipsia.  Genitourinary:  Positive for hematuria. Negative for dysuria, frequency, hesitancy, incomplete emptying, nocturia and urgency.  Musculoskeletal:  Negative for back pain, myalgias and neck pain.  Skin:  Negative for rash.  Allergic/Immunologic: Negative for environmental allergies.  Neurological:  Negative for dizziness and headaches.  Hematological:  Does not bruise/bleed easily.  Psychiatric/Behavioral:  Negative for suicidal ideas. The patient is not nervous/anxious.    Patient Active Problem List   Diagnosis Date Noted   Acute pain of left knee 06/20/2020   Pain and swelling of left lower leg 06/20/2020   Pelvic pain 10/25/2019   Acute anxiety 08/10/2017   Overweight (BMI 25.0-29.9) 03/01/2017   Dizziness 06/05/2016   Essential hypertension 06/06/2015   Mixed hyperlipidemia 06/06/2015   Gastroesophageal reflux disease without esophagitis 06/06/2015   History of melanoma in situ 05/21/2014    No Known Allergies  Past Surgical History:  Procedure Laterality Date   APPENDECTOMY     CHOLECYSTECTOMY     COLONOSCOPY  2014   cleared   COLONOSCOPY WITH PROPOFOL N/A 07/03/2019   Procedure: COLONOSCOPY WITH PROPOFOL;  Surgeon: Toledo, Benay Pike, MD;  Location: [facility] ENDOSCOPY;  Service: Gastroenterology;  Laterality: N/A;   MASTECTOMY Right 08/2012   VAGINAL HYSTERECTOMY  Social History   Tobacco Use   Smoking status: Never   Smokeless tobacco: Never  Vaping Use   Vaping Use: Never used  Substance Use Topics   Alcohol use: No    Alcohol/week: 0.0 standard drinks   Drug use: No     Medication list has been reviewed and updated.  Current Meds  Medication Sig   ALPRAZolam (XANAX) 0.25 MG tablet Take 1 tablet (0.25 mg total) by mouth 1 day or 1 dose.    aspirin EC 81 MG tablet Take 81 mg by mouth daily.   cetirizine (ZYRTEC) 10 MG tablet Take 1 tablet (10 mg total) by mouth daily.   gemfibrozil (LOPID) 600 MG tablet Take 1 tablet (600 mg total) by mouth daily. TAKE 1 TABLET BY MOUTH once A DAY   hydrochlorothiazide (MICROZIDE) 12.5 MG capsule TAKE 1 CAPSULE BY MOUTH EVERY DAY   latanoprost (XALATAN) 0.005 % ophthalmic solution Place 1 drop into both eyes at bedtime.   meclizine (ANTIVERT) 25 MG tablet Take 1 tablet (25 mg total) by mouth 3 (three) times daily as needed for dizziness.   meloxicam (MOBIC) 7.5 MG tablet Take 1 tablet (7.5 mg total) by mouth daily.   metoprolol tartrate (LOPRESSOR) 50 MG tablet Take 1 tablet (50 mg total) by mouth 2 (two) times daily.   omeprazole (PRILOSEC) 20 MG capsule TAKE 1 CAPSULE BY MOUTH EVERY DAY   simvastatin (ZOCOR) 80 MG tablet Take 1 tablet (80 mg total) by mouth daily.    PHQ 2/9 Scores 06/20/2020 05/23/2020 10/25/2019 02/06/2019  PHQ - 2 Score 0 0 0 0  PHQ- 9 Score 0 0 - 0    GAD 7 : Generalized Anxiety Score 06/20/2020 05/23/2020 02/06/2019  Nervous, Anxious, on Edge 0 0 0  Control/stop worrying 0 0 0  Worry too much - different things 0 0 0  Trouble relaxing 0 0 0  Restless 0 0 0  Easily annoyed or irritable 0 0 0  Afraid - awful might happen 0 0 0  Total GAD 7 Score 0 0 0  Anxiety Difficulty Not difficult at all - -    BP Readings from Last 3 Encounters:  06/21/20 120/60  06/20/20 140/68  06/03/20 120/60    Physical Exam Vitals and nursing note reviewed.  Constitutional:      Appearance: She is well-developed.  HENT:     Head: Normocephalic.     Right Ear: Tympanic membrane, ear canal and external ear normal.     Left Ear: Tympanic membrane, ear canal and external ear normal.  Eyes:     General: Lids are everted, no foreign bodies appreciated. No scleral icterus.       Left eye: No foreign body or hordeolum.     Conjunctiva/sclera: Conjunctivae normal.     Right eye: Right  conjunctiva is not injected.     Left eye: Left conjunctiva is not injected.     Pupils: Pupils are equal, round, and reactive to light.  Neck:     Thyroid: No thyromegaly.     Vascular: No JVD.     Trachea: No tracheal deviation.  Cardiovascular:     Rate and Rhythm: Normal rate and regular rhythm.     Heart sounds: Normal heart sounds. No murmur heard.   No friction rub. No gallop.  Pulmonary:     Effort: Pulmonary effort is normal. No respiratory distress.     Breath sounds: Normal breath sounds. No wheezing or rales.  Abdominal:  General: Bowel sounds are normal.     Palpations: Abdomen is soft. There is no mass.     Tenderness: There is no abdominal tenderness. There is no guarding or rebound.  Musculoskeletal:        General: No tenderness. Normal range of motion.     Cervical back: Normal range of motion and neck supple.  Lymphadenopathy:     Cervical: No cervical adenopathy.  Skin:    General: Skin is warm.     Findings: No rash.  Neurological:     Mental Status: She is alert and oriented to person, place, and time.     Cranial Nerves: No cranial nerve deficit.     Deep Tendon Reflexes: Reflexes normal.  Psychiatric:        Mood and Affect: Mood is not anxious or depressed.    Wt Readings from Last 3 Encounters:  06/21/20 142 lb (64.4 kg)  06/20/20 141 lb 3.2 oz (64 kg)  06/03/20 142 lb (64.4 kg)    BP 120/60   Pulse 60   Ht 5\' 7"  (1.702 m)   Wt 142 lb (64.4 kg)   BMI 22.24 kg/m   Assessment and Plan:  1. Hematuria, unspecified type New onset.  Episodic.  Stable.  Complicated.  Patient has had most recently a question full rectal bleeding but with guaiac negative suprapubic discomfort with possible hematuria but she filled up a cough over three quarters and it was dark red, or the possibility that she has had postmenopausal bleeding although she is pretty adamant that its urine and she has a history of bladder ulcers/partial hysterectomy that she saw Dr.  Collins [place] in the past.  We will repeat guaiac stools.  Psoas complicated in that is uncertain if its bladder of origin or cervical vaginal of origin or rectal or anal of origin.  We will start with referral to urology and if this is unremarkable her next step would probably be GYN referral. - POCT urinalysis dipstick

## 2020-06-25 ENCOUNTER — Encounter: Payer: Self-pay | Admitting: Urology

## 2020-06-25 ENCOUNTER — Other Ambulatory Visit: Payer: Self-pay | Admitting: *Deleted

## 2020-06-25 ENCOUNTER — Other Ambulatory Visit (INDEPENDENT_AMBULATORY_CARE_PROVIDER_SITE_OTHER): Payer: Medicare HMO

## 2020-06-25 ENCOUNTER — Other Ambulatory Visit
Admission: RE | Admit: 2020-06-25 | Discharge: 2020-06-25 | Disposition: A | Discharge status: Home / Self Care | Payer: Medicare HMO | Attending: Urology | Admitting: Urology

## 2020-06-25 ENCOUNTER — Other Ambulatory Visit: Payer: Self-pay

## 2020-06-25 ENCOUNTER — Ambulatory Visit: Payer: Medicare HMO | Admitting: Urology

## 2020-06-25 VITALS — BP 150/77 | HR 87 | Ht 64.0 in | Wt 140.0 lb

## 2020-06-25 DIAGNOSIS — R310 Gross hematuria: Secondary | ICD-10-CM | POA: Insufficient documentation

## 2020-06-25 DIAGNOSIS — K625 Hemorrhage of anus and rectum: Secondary | ICD-10-CM | POA: Diagnosis not present

## 2020-06-25 DIAGNOSIS — R102 Pelvic and perineal pain: Secondary | ICD-10-CM | POA: Diagnosis not present

## 2020-06-25 LAB — URINALYSIS, COMPLETE (UACMP) WITH MICROSCOPIC
Bilirubin Urine: NEGATIVE
Glucose, UA: NEGATIVE mg/dL
Hgb urine dipstick: NEGATIVE
Ketones, ur: NEGATIVE mg/dL
Leukocytes,Ua: NEGATIVE
Nitrite: NEGATIVE
Protein, ur: NEGATIVE mg/dL
Specific Gravity, Urine: 1.01 (ref 1.005–1.030)
pH: 6.5 (ref 5.0–8.0)

## 2020-06-25 LAB — HEMOCCULT GUIAC POC 1CARD (OFFICE)
Card #2 Fecal Occult Blod, POC: NEGATIVE
Card #3 Fecal Occult Blood, POC: NEGATIVE
Fecal Occult Blood, POC: NEGATIVE

## 2020-06-25 NOTE — Progress Notes (Signed)
   06/25/20 9:52 AM   Amber Galloway Jan 17, 1940 [id number]  CC: Gross hematuria  HPI: Amber Galloway is a 80 year old female with history of breast cancer who reports 3 episodes of gross hematuria that was painless and resolved spontaneously.  Urinalysis was checked by PCP and was completely benign.  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  She denies any UTI symptoms.  She reports urinating dark red blood 3 times in a row overnight that then resolved in the morning.  There was no clots.  She has a distant history of recurrent UTIs that was followed by Dr. Ernst Spell, and she reportedly had a cystoscopy with fulguration of ulcers, and urethral dilation at that time.  Those records are not available to me.  She denies any smoking history.  She previously worked in a [place] but denies any exposure to chemicals or dyes.  Urinalysis today is completely benign with no microscopic hematuria.   PMH: Past Medical History:  Diagnosis Date   Breast cancer ([place]) 08/2012   rt mastectomy   GERD (gastroesophageal reflux disease)    Hyperlipidemia    Hypertension    Melanoma ([place])    Myocardial infarction [facility]) 1991    Surgical History: Past Surgical History:  Procedure Laterality Date   APPENDECTOMY     CHOLECYSTECTOMY     COLONOSCOPY  2014   cleared   COLONOSCOPY WITH PROPOFOL N/A 07/03/2019   Procedure: COLONOSCOPY WITH PROPOFOL;  Surgeon: Toledo, Benay Pike, MD;  Location: [facility] ENDOSCOPY;  Service: Gastroenterology;  Laterality: N/A;   MASTECTOMY Right 08/2012   VAGINAL HYSTERECTOMY      Family History: Family History  Problem Relation Age of Onset   Breast cancer Sister 94   Brain cancer Daughter    Breast cancer Maternal Aunt 80    Social History:  reports that she has never smoked. She has never used smokeless tobacco. She reports that she does not drink alcohol and does not  use drugs.  Physical Exam: BP (!) 150/77   Pulse 87   Ht 5\' 4"  (1.626 m)   Wt 140 lb (63.5 kg)   BMI 24.03 kg/m    Constitutional:  Alert and oriented, No acute distress. Cardiovascular: No clubbing, cyanosis, or edema. Respiratory: Normal respiratory effort, no increased work of breathing. GI: Abdomen is soft, nontender, nondistended, no abdominal masses   Laboratory Data: Reviewed  Pertinent Imaging: I have personally viewed and interpreted the CT dated 06/01/2019 showing no renal or bladder abnormalities, no hydronephrosis, no stones.  Assessment & Plan:   80 year old female with 3 episodes of significant painless gross hematuria, urinalysis completely negative today, CT abdomen pelvis with contrast in May 2021 benign.  We discussed common possible etiologies of hematuria including malignancy, urolithiasis, medical renal disease, and idiopathic. Standard workup recommended by the AUA includes imaging with CT urogram to assess the upper tracts, and cystoscopy. Cytology is performed on patient's with gross hematuria to look for malignant cells in the urine.  Using shared decision making, we opted for a renal ultrasound with her recent negative CT and the national contrast shortage, as well as cystoscopy.  RTC for cystoscopy in [place].  [name], MD 06/25/2020  [facility] [address], [place], [place] [address] [phone]

## 2020-06-25 NOTE — Patient Instructions (Signed)
Cystoscopy Cystoscopy is a procedure that is used to help diagnose and sometimes treat conditions that affect the lower urinary tract. The lower urinary tract includes the bladder and the urethra. The urethra is the tube that drains urine from the bladder. Cystoscopy is done using a thin, tube-shaped instrument with a light and camera at the end (cystoscope). The cystoscope may be hard or flexible, depending on the goal of the procedure. The cystoscope is inserted through the urethra, into the bladder. Cystoscopy may be recommended if you have: Urinary tract infections that keep coming back. Blood in the urine (hematuria). An inability to control when you urinate (urinary incontinence) or an overactive bladder. Unusual cells found in a urine sample. A blockage in the urethra, such as a urinary stone. Painful urination. An abnormality in the bladder found during an intravenous pyelogram (IVP) or CT scan. Cystoscopy may also be done to remove a sample of tissue to be examined under a microscope (biopsy). What are the risks? Generally, this is a safe procedure. However, problems may occur, including: Infection. Bleeding.  What happens during the procedure?  You will be given one or more of the following: A medicine to numb the area (local anesthetic). The area around the opening of your urethra will be cleaned. The cystoscope will be passed through your urethra into your bladder. Germ-free (sterile) fluid will flow through the cystoscope to fill your bladder. The fluid will stretch your bladder so that your health care provider can clearly examine your bladder walls. Your doctor will look at the urethra and bladder. The cystoscope will be removed The procedure may vary among health care providers  What can I expect after the procedure? After the procedure, it is common to have: Some soreness or pain in your abdomen and urethra. Urinary symptoms. These include: Mild pain or burning when you  urinate. Pain should stop within a few minutes after you urinate. This may last for up to 1 week. A small amount of blood in your urine for several days. Feeling like you need to urinate but producing only a small amount of urine. Follow these instructions at home: General instructions Return to your normal activities as told by your health care provider.  Do not drive for 24 hours if you were given a sedative during your procedure. Watch for any blood in your urine. If the amount of blood in your urine increases, call your health care provider. If a tissue sample was removed for testing (biopsy) during your procedure, it is up to you to get your test results. Ask your health care provider, or the department that is doing the test, when your results will be ready. Drink enough fluid to keep your urine pale yellow. Keep all follow-up visits as told by your health care provider. This is important. Contact a health care provider if you: Have pain that gets worse or does not get better with medicine, especially pain when you urinate. Have trouble urinating. Have more blood in your urine. Get help right away if you: Have blood clots in your urine. Have abdominal pain. Have a fever or chills. Are unable to urinate. Summary Cystoscopy is a procedure that is used to help diagnose and sometimes treat conditions that affect the lower urinary tract. Cystoscopy is done using a thin, tube-shaped instrument with a light and camera at the end. After the procedure, it is common to have some soreness or pain in your abdomen and urethra. Watch for any blood in your urine.   If the amount of blood in your urine increases, call your health care provider. If you were prescribed an antibiotic medicine, take it as told by your health care provider. Do not stop taking the antibiotic even if you start to feel better. This information is not intended to replace advice given to you by your health care provider. Make  sure you discuss any questions you have with your health care provider. Document Revised: 12/21/2017 Document Reviewed: 12/21/2017 Elsevier Patient Education  2020 [employer]  

## 2020-06-28 NOTE — Progress Notes (Signed)
Patient views labs and Dr Zigmund Daniel note on my chart.

## 2020-07-04 ENCOUNTER — Encounter: Payer: Self-pay | Admitting: Family Medicine

## 2020-07-04 ENCOUNTER — Inpatient Hospital Stay: Payer: Self-pay | Admitting: Radiology

## 2020-07-04 ENCOUNTER — Other Ambulatory Visit: Payer: Self-pay

## 2020-07-04 ENCOUNTER — Ambulatory Visit (INDEPENDENT_AMBULATORY_CARE_PROVIDER_SITE_OTHER): Payer: Medicare HMO | Admitting: Family Medicine

## 2020-07-04 VITALS — BP 128/68 | HR 75 | Temp 98.0°F | Ht 64.0 in | Wt 141.0 lb

## 2020-07-04 DIAGNOSIS — M1712 Unilateral primary osteoarthritis, left knee: Secondary | ICD-10-CM

## 2020-07-04 MED ORDER — TRIAMCINOLONE ACETONIDE 40 MG/ML IJ SUSP
40.0000 mg | Freq: Once | INTRAMUSCULAR | Status: AC
  Administered 2020-07-04: 40 mg

## 2020-07-04 NOTE — Progress Notes (Signed)
Primary Care / Sports Medicine Office Visit  Patient Information:  Patient ID: Amber Galloway, female DOB: 07/25/1940 Age: 80 y.o. MRN: [id number]   Amber Galloway is a pleasant 80 y.o. female presenting with the following:  Chief Complaint  Patient presents with   Follow-up   Knee Pain    Left; X-Ray 06/20/20; possible Monovisc Injection 88mg /73mL; 4/10 pain    Review of Systems pertinent details above   Patient Active Problem List   Diagnosis Date Noted   Primary osteoarthritis of left knee 06/20/2020   Pain and swelling of left lower leg 06/20/2020   Pelvic pain 10/25/2019   Acute anxiety 08/10/2017   Overweight (BMI 25.0-29.9) 03/01/2017   Dizziness 06/05/2016   Essential hypertension 06/06/2015   Mixed hyperlipidemia 06/06/2015   Gastroesophageal reflux disease without esophagitis 06/06/2015   History of melanoma in situ 05/21/2014   Past Medical History:  Diagnosis Date   Breast cancer ([place]) 08/2012   rt mastectomy   GERD (gastroesophageal reflux disease)    Hyperlipidemia    Hypertension    Melanoma ([place])    Myocardial infarction [facility]) 1991   Outpatient Encounter Medications as of 07/04/2020  Medication Sig   ALPRAZolam (XANAX) 0.25 MG tablet Take 1 tablet (0.25 mg total) by mouth 1 day or 1 dose.   aspirin EC 81 MG tablet Take 81 mg by mouth daily.   cetirizine (ZYRTEC) 10 MG tablet Take 1 tablet (10 mg total) by mouth daily.   gemfibrozil (LOPID) 600 MG tablet Take 1 tablet (600 mg total) by mouth daily. TAKE 1 TABLET BY MOUTH once A DAY   hydrochlorothiazide (MICROZIDE) 12.5 MG capsule TAKE 1 CAPSULE BY MOUTH EVERY DAY   latanoprost (XALATAN) 0.005 % ophthalmic solution Place 1 drop into both eyes at bedtime.   meclizine (ANTIVERT) 25 MG tablet Take 1 tablet (25 mg total) by mouth 3 (three) times daily as needed for dizziness.   metoprolol tartrate (LOPRESSOR) 50 MG tablet Take 1 tablet (50 mg total) by mouth 2 (two) times daily.   omeprazole  (PRILOSEC) 20 MG capsule TAKE 1 CAPSULE BY MOUTH EVERY DAY   simvastatin (ZOCOR) 80 MG tablet Take 1 tablet (80 mg total) by mouth daily.   meloxicam (MOBIC) 7.5 MG tablet Take 1 tablet (7.5 mg total) by mouth daily. (Patient not taking: Reported on 07/04/2020)   [EXPIRED] triamcinolone acetonide (KENALOG-40) injection 40 mg    No facility-administered encounter medications on file as of 07/04/2020.   Past Surgical History:  Procedure Laterality Date   APPENDECTOMY     CHOLECYSTECTOMY     COLONOSCOPY  2014   cleared   COLONOSCOPY WITH PROPOFOL N/A 07/03/2019   Procedure: COLONOSCOPY WITH PROPOFOL;  Surgeon: Toledo, Benay Pike, MD;  Location: [facility] ENDOSCOPY;  Service: Gastroenterology;  Laterality: N/A;   MASTECTOMY Right 08/2012   VAGINAL HYSTERECTOMY      Vitals:   07/04/20 1324  BP: 128/68  Pulse: 75  Temp: 98 F (36.7 C)  SpO2: 98%   Vitals:   07/04/20 1324  Weight: 141 lb (64 kg)  Height: 5\' 4"  (1.626 m)   Body mass index is 24.2 kg/m.   DG Knee Complete 4 Views Left  Result Date: 06/23/2020 CLINICAL DATA:  80 year old female with left knee pain. EXAM: LEFT KNEE - COMPLETE 4+ VIEW COMPARISON:  None. FINDINGS: There is no acute fracture or dislocation. The bones are osteopenic. There is arthritic changes of the left knee with moderate narrowing the medial  compartment and spurring. No joint effusion. The soft tissues are grossly unremarkable. IMPRESSION: 1. No acute fracture or dislocation. 2. 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 Electronically Signed   By: Anner Crete M.D.   On: 06/23/2020 02:04     Independent interpretation of notes and tests performed by another provider:   Independent interpretation of left knee x-ray dated 06/23/2020 reveals medial tibiofemoral severe narrowing with osteophyte formation/lipping at the distal femoral condyle and proximal tibia, patellofemoral medial narrowing and lateral patellar facet osteophyte, lateral joint space  appears to be grossly preserved, no acute osseous process identified  Procedures performed:   Procedure:  Injection of left knee anterolateral approach under ultrasound guidance. Ultrasound guidance utilized for visualization of lateral joint space, sonographic response to injectate visualized Samsung HS60 device utilized with permanent recording / reporting. Consent obtained and verified. Skin prepped in a sterile fashion. Ethyl chloride spray for topical local analgesia.  Completed without difficulty and tolerated well. Medication: triamcinolone acetonide 40 mg/mL suspension for injection 1 mL total and 2 mL lidocaine 1% without epinephrine utilized for needle placement anesthetic Advised to contact for fevers/chills, erythema, induration, drainage, or persistent bleeding.   Pertinent History, Exam, Impression, and Recommendations:   Primary osteoarthritis of left knee Left knee has shown persistent symptomatology, this is a chronic issue with ongoing exacerbation.  Given her persistent findings and recent radiographs confirming osteoarthritis with severe focality to the medial compartment and moderate involvement of the patellofemoral compartment, I did review additional treatment strategies.  At this stage she elected to proceed with Monovisc, adjunct cortisone-lidocaine was utilized given her current symptomatology and an effort to minimize NSAID exposure.  She tolerated the procedure well and postinjection care was outlined.  I did provide to her a home-based rehab program for her to initiate next week for optimizing her clinical outcomes.  We can repeat the Monovisc in 6 months if clinically indicated, she can otherwise contact for questions and follow-up as needed.    Orders & Medications Meds ordered this encounter  Medications   triamcinolone acetonide (KENALOG-40) injection 40 mg   Orders Placed This Encounter  Procedures   [place] LIMITED JOINT SPACE STRUCTURES LOW LEFT      Return if symptoms worsen or fail to improve.     Montel Culver, MD   Primary Care Sports Medicine [place]

## 2020-07-04 NOTE — Assessment & Plan Note (Signed)
Left knee has shown persistent symptomatology, this is a chronic issue with ongoing exacerbation.  Given her persistent findings and recent radiographs confirming osteoarthritis with severe focality to the medial compartment and moderate involvement of the patellofemoral compartment, I did review additional treatment strategies.  At this stage she elected to proceed with Monovisc, adjunct cortisone-lidocaine was utilized given her current symptomatology and an effort to minimize NSAID exposure.  She tolerated the procedure well and postinjection care was outlined.  I did provide to her a home-based rehab program for her to initiate next week for optimizing her clinical outcomes.  We can repeat the Monovisc in 6 months if clinically indicated, she can otherwise contact for questions and follow-up as needed.

## 2020-07-04 NOTE — Patient Instructions (Signed)
You have just been given a cortisone injection to reduce pain and inflammation alongside Monovisc (gel injection). After the injection you may notice immediate relief of pain as a result of the Lidocaine. It is important to rest the area of the injection for 24 to 48 hours after the injection. There is a possibility of some temporary increased discomfort and swelling for up to 72 hours until the cortisone begins to work. If you do have pain, simply rest the joint and use ice. If you can tolerate over the counter medications, you can try Tylenol for added relief per package instructions. - Monovisc can be repeated in 6 months if indicated - Start home exercises next week, focus on slow and steady progression - Call for any questions and follow-up as needed

## 2020-07-09 ENCOUNTER — Other Ambulatory Visit: Payer: Self-pay

## 2020-07-09 ENCOUNTER — Ambulatory Visit
Admission: RE | Admit: 2020-07-09 | Discharge: 2020-07-09 | Disposition: A | Discharge status: Home / Self Care | Payer: Medicare HMO | Source: Ambulatory Visit | Attending: Urology | Admitting: Urology

## 2020-07-09 DIAGNOSIS — R310 Gross hematuria: Secondary | ICD-10-CM

## 2020-07-09 DIAGNOSIS — N281 Cyst of kidney, acquired: Secondary | ICD-10-CM | POA: Diagnosis not present

## 2020-07-18 ENCOUNTER — Other Ambulatory Visit: Payer: Self-pay | Admitting: Urology

## 2020-07-25 ENCOUNTER — Encounter: Payer: Self-pay | Admitting: Urology

## 2020-07-25 ENCOUNTER — Ambulatory Visit: Payer: Medicare HMO | Admitting: Urology

## 2020-07-25 ENCOUNTER — Other Ambulatory Visit: Payer: Self-pay

## 2020-07-25 ENCOUNTER — Other Ambulatory Visit: Payer: Self-pay | Admitting: Urology

## 2020-07-25 VITALS — BP 146/72 | HR 90 | Ht 64.0 in | Wt 139.6 lb

## 2020-07-25 DIAGNOSIS — D494 Neoplasm of unspecified behavior of bladder: Secondary | ICD-10-CM | POA: Diagnosis not present

## 2020-07-25 DIAGNOSIS — R310 Gross hematuria: Secondary | ICD-10-CM | POA: Diagnosis not present

## 2020-07-25 LAB — MICROSCOPIC EXAMINATION: Bacteria, UA: NONE SEEN

## 2020-07-25 LAB — URINALYSIS, COMPLETE
Bilirubin, UA: NEGATIVE
Glucose, UA: NEGATIVE
Ketones, UA: NEGATIVE
Leukocytes,UA: NEGATIVE
Nitrite, UA: NEGATIVE
Protein,UA: NEGATIVE
Specific Gravity, UA: 1.015 (ref 1.005–1.030)
Urobilinogen, Ur: 1 mg/dL (ref 0.2–1.0)
pH, UA: 6.5 (ref 5.0–7.5)

## 2020-07-25 MED ORDER — LIDOCAINE HCL URETHRAL/MUCOSAL 2 % EX GEL
1.0000 "application " | Freq: Once | CUTANEOUS | Status: AC
  Administered 2020-07-25: 1 via URETHRAL

## 2020-07-25 MED ORDER — GEMCITABINE CHEMO FOR BLADDER INSTILLATION 2000 MG: 2000.0000 mg | Freq: Once | INTRAVENOUS | Status: DC

## 2020-07-25 NOTE — Progress Notes (Signed)
Cystoscopy Procedure Note:  Indication: Gross hematuria  After informed consent and discussion of the procedure and its risks, Amber Galloway was positioned and prepped in the standard fashion. Cystoscopy was performed with a flexible cystoscope. The urethra, bladder neck and entire bladder was visualized in a standard fashion. The ureteral orifices were visualized in their normal location and orientation.  At the left posterior dome of the bladder there were 3 small papillary appearing tumors, the largest measuring about 1 cm.   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  We discussed transurethral resection of bladder tumor (TURBT) and risks and benefits at length. This is typically a 1 to 2-hour procedure done under general anesthesia in the operating room.  A scope is inserted through the urethra and used to resect abnormal tissue within the bladder, which is then sent to the pathologist to determine grade and stage of the tumor.  Risks include bleeding, infection, need for temporary Foley placement, and bladder perforation.  Treatment strategies are based on the type of tumor and depth of invasion.  We briefly reviewed the different treatment pathways for non-muscle invasive and muscle invasive bladder cancer.  Schedule TURBT with gemcitabine Consider further staging imaging with CT pending pathology findings  [name], MD 07/25/2020

## 2020-07-25 NOTE — Patient Instructions (Signed)
Transurethral Resection of Bladder Tumor  Transurethral resection of a bladder tumor is the removal (resection) of a cancerous growth (tumor) on the inside wall of the bladder. The bladder is the organ that holds urine. The tumor is removed through the tube that carries urine out of the body (urethra). In a transurethral resection, a thin telescope with a light, a tiny camera, and an electric cutting edge (resectoscope) is passed through the urethra. In men, the opening of the urethra is at theend of the penis. In women, it is just above the opening of the vagina. Tell a health care provider about: Any allergies you have. All medicines you are taking, including vitamins, herbs, eye drops, creams, and over-the-counter medicines. Any problems you or family members have had with anesthetic medicines. Any blood disorders you have. Any surgeries you have had. Any medical conditions you have. Any recent urinary tract infections you have had. Whether you are pregnant or may be pregnant. What are the risks? Generally, this is a safe procedure. However, problems may occur, including: Infection. Bleeding. Allergic reactions to medicines. Damage to nearby structures or organs, such as: The urethra. The tubes that drain urine from the kidneys into the bladder (ureters). Pain and burning during urination. Difficulty urinating due to partial blockage of the urethra. Inability to urinate (urinary retention). What happens before the procedure? Staying hydrated Follow instructions from your health care provider about hydration, which may include: Up to 2 hours before the procedure - you may continue to drink clear liquids, such as water, clear fruit juice, black coffee, and plain tea.  Eating and drinking restrictions Follow instructions from your health care provider about eating and drinking, which may include: 8 hours before the procedure - stop eating heavy meals or foods, such as meat, fried  foods, or fatty foods. 6 hours before the procedure - stop eating light meals or foods, such as toast or cereal. 6 hours before the procedure - stop drinking milk or drinks that contain milk. 2 hours before the procedure - stop drinking clear liquids. Medicines Ask your health care provider about: Changing or stopping your regular medicines. This is especially important if you are taking diabetes medicines or blood thinners. Taking medicines such as aspirin and ibuprofen. These medicines can thin your blood. Do not take these medicines unless your health care provider tells you to take them. Taking over-the-counter medicines, vitamins, herbs, and supplements. Tests You may have exams or tests, including: Physical exam. Blood tests. Urine tests. Electrocardiogram (ECG). This test measures the electrical activity of the heart. General instructions Plan to have someone take you home from the hospital or clinic. Ask your health care provider how your surgical site will be marked or identified. Ask your health care provider what steps will be taken to help prevent infection. These may include: Washing skin with a germ-killing soap. Taking antibiotic medicine. What happens during the procedure? An IV will be inserted into one of your veins. You will be given one or more of the following: A medicine to help you relax (sedative). A medicine to make you fall asleep (general anesthetic). A medicine that is injected into your spine to numb the area below and slightly above the injection site (spinal anesthetic). Your legs will be placed in foot rests (stirrups) so that your legs are apart and your knees are bent. The resectoscope will be passed through your urethra and into your bladder. The part of your bladder that is affected by the tumor will be  resected using the cutting edge of the resectoscope. The resectoscope will be removed. A thin, flexible tube (catheter) will be passed through your  urethra and into your bladder. The catheter will drain urine into a bag outside of your body. Fluid may be passed through the catheter to keep the catheter open. The procedure may vary among health care providers and hospitals. What happens after the procedure? Your blood pressure, heart rate, breathing rate, and blood oxygen level will be monitored until you leave the hospital or clinic. You may continue to receive fluids and medicines through an IV. You will have some pain. You will be given pain medicine to relieve pain. You will have a catheter to drain your urine. You will have blood in your urine. Your catheter may be kept in until your urine is clear. The amount of urine will be monitored. If necessary, your bladder may be rinsed out (irrigated) by passing fluid through your catheter. You will be encouraged to walk around as soon as possible. You may have to wear compression stockings. These stockings help to prevent blood clots and reduce swelling in your legs. Do not drive for 24 hours if you were given a sedative during your procedure. Summary Transurethral resection of a bladder tumor is the removal (resection) of a cancerous growth (tumor) on the inside wall of the bladder. To do this procedure, your health care provider uses a thin telescope with a light, a tiny camera, and an electric cutting edge (resectoscope). Follow your health care provider's instructions. You may need to stop or change certain medicines, and you may be told to stop eating and drinking several hours before the procedure. Your blood pressure, heart rate, breathing rate, and blood oxygen level will be monitored until you leave the hospital or clinic. You may have to wear compression stockings. These stockings help to prevent blood clots and reduce swelling in your legs. This information is not intended to replace advice given to you by your health care provider. Make sure you discuss any questions you have with  your healthcare provider. Document Revised: 07/30/2017 Document Reviewed: 07/30/2017 Elsevier Patient Education  [place].

## 2020-07-28 LAB — CULTURE, URINE COMPREHENSIVE

## 2020-07-29 ENCOUNTER — Encounter
Admission: RE | Admit: 2020-07-29 | Discharge: 2020-07-29 | Disposition: A | Discharge status: Home / Self Care | Payer: Medicare HMO | Source: Ambulatory Visit | Attending: Urology | Admitting: Urology

## 2020-07-29 ENCOUNTER — Other Ambulatory Visit: Payer: Self-pay

## 2020-07-29 HISTORY — DX: Other specified postprocedural states: R11.2

## 2020-07-29 HISTORY — DX: Other specified postprocedural states: Z98.890

## 2020-07-29 HISTORY — DX: Anxiety disorder, unspecified: F41.9

## 2020-07-29 HISTORY — DX: Neoplasm of unspecified behavior of bladder: D49.4

## 2020-07-29 HISTORY — DX: Prediabetes: R73.03

## 2020-07-29 HISTORY — DX: Headache, unspecified: R51.9

## 2020-07-29 NOTE — Patient Instructions (Addendum)
Your procedure is scheduled on:08-02-20 Friday Report to the Registration Desk on the 1st floor of the [facility]. To find out your arrival time, please call [phone] between 1PM - 3PM on:08-01-20 Thursday  REMEMBER: Instructions that are not followed completely may result in serious medical risk, up to and including death; or upon the discretion of your surgeon and anesthesiologist your surgery may need to be rescheduled.  Do not eat food OR drink any liquids after midnight the night before surgery.  No gum chewing, lozengers or hard candies.  TAKE THESE MEDICATIONS THE MORNING OF SURGERY WITH A SIP OF WATER: -Metoprolol (Lopressor) -Omeprazole (Prilosec)-take one the night before and one on the morning of surgery - helps to prevent nausea after surgery.) -You may take Xanax (Alprazolam) the day of surgery if needed for anxiety  Stop your Aspirin 7 days prior to surgery as instructed by Dr Doristine Counter office-Stop NOW 07-29-20   One week prior to surgery: Stop Anti-inflammatories (NSAIDS) such as Advil, Aleve, Ibuprofen, Motrin, Naproxen, Naprosyn and Aspirin based products such as Excedrin, Goodys Powder, BC Powder.You may however, continue to take Tylenol if needed for pain up until the day of surgery.  Stop ANY OVER THE COUNTER supplements/vitamins NOW 07-29-20 until after surgery(Fish oil, Biotin, Multivitamin)  No Alcohol for 24 hours before or after surgery.  No Smoking including e-cigarettes for 24 hours prior to surgery.  No chewable tobacco products for at least 6 hours prior to surgery.  No nicotine patches on the day of surgery.  Do not use any "recreational" drugs for at least a week prior to your surgery.  Please be advised that the combination of cocaine and anesthesia may have negative outcomes, up to and including death. If you test positive for cocaine, your surgery will be cancelled.  On the morning of surgery brush your teeth with toothpaste and water, you may  rinse your mouth with mouthwash if you wish. Do not swallow any toothpaste or mouthwash.  Do not wear jewelry, make-up, hairpins, clips or nail polish.  Do not wear lotions, powders, or perfumes.   Do not shave body from the neck down 48 hours prior to surgery just in case you cut yourself which could leave a site for infection.  Also, freshly shaved skin may become irritated if using the CHG soap.  Contact lenses, hearing aids and dentures may not be worn into surgery.  Do not bring valuables to the hospital. [facility] is not responsible for any missing/lost belongings or valuables.   Notify your doctor if there is any change in your medical condition (cold, fever, infection).  Wear comfortable clothing (specific to your surgery type) to the hospital.  After surgery, you can help prevent lung complications by doing breathing exercises.  Take deep breaths and cough every 1-2 hours. Your doctor may order a device called an Incentive Spirometer to help you take deep breaths. When coughing or sneezing, hold a pillow firmly against your incision with both hands. This is called "splinting." Doing this helps protect your incision. It also decreases belly discomfort.  If you are being admitted to the hospital overnight, leave your suitcase in the car. After surgery it may be brought to your room.  If you are being discharged the day of surgery, you will not be allowed to drive home. You will need a responsible adult (18 years or older) to drive you home and stay with you that night.   If you are taking public transportation, you will  need to have a responsible adult (18 years or older) with you. Please confirm with your physician that it is acceptable to use public transportation.   Please call the [facility]. at [phone] if you have any questions about these instructions.  Surgery Visitation Policy:  Patients undergoing a surgery or procedure may have one family  member or support person with them as long as that person is not COVID-19 positive or experiencing its symptoms.  That person may remain in the waiting area during the procedure.  Inpatient Visitation:    Visiting hours are 7 a.m. to 8 p.m. Inpatients will be allowed two visitors daily. The visitors may change each day during the patient's stay. No visitors under the age of 69. Any visitor under the age of 65 must be accompanied by an adult. The visitor must pass COVID-19 screenings, use hand sanitizer when entering and exiting the patient's room and wear a mask at all times, including in the patient's room. Patients must also wear a mask when staff or their visitor are in the room. Masking is required regardless of vaccination status.

## 2020-07-30 ENCOUNTER — Encounter
Admission: RE | Admit: 2020-07-30 | Discharge: 2020-07-30 | Disposition: A | Discharge status: Home / Self Care | Payer: Medicare HMO | Source: Ambulatory Visit | Attending: Urology | Admitting: Urology

## 2020-07-30 DIAGNOSIS — Z01818 Encounter for other preprocedural examination: Secondary | ICD-10-CM | POA: Insufficient documentation

## 2020-07-30 DIAGNOSIS — I252 Old myocardial infarction: Secondary | ICD-10-CM | POA: Diagnosis not present

## 2020-07-30 DIAGNOSIS — I10 Essential (primary) hypertension: Secondary | ICD-10-CM | POA: Diagnosis not present

## 2020-07-30 LAB — CBC
HCT: 38.1 % (ref 36.0–46.0)
Hemoglobin: 12.9 g/dL (ref 12.0–15.0)
MCH: 30.2 pg (ref 26.0–34.0)
MCHC: 33.9 g/dL (ref 30.0–36.0)
MCV: 89.2 fL (ref 80.0–100.0)
Platelets: 182 10*3/uL (ref 150–400)
RBC: 4.27 MIL/uL (ref 3.87–5.11)
RDW: 13.2 % (ref 11.5–15.5)
WBC: 7.2 10*3/uL (ref 4.0–10.5)
nRBC: 0 % (ref 0.0–0.2)

## 2020-07-30 LAB — POTASSIUM: Potassium: 3.6 mmol/L (ref 3.5–5.1)

## 2020-08-02 ENCOUNTER — Encounter
Admission: RE | Disposition: A | Discharge status: Home / Self Care | Payer: Self-pay | Source: Home / Self Care | Attending: Urology

## 2020-08-02 ENCOUNTER — Ambulatory Visit: Payer: Medicare HMO | Admitting: Anesthesiology

## 2020-08-02 ENCOUNTER — Other Ambulatory Visit: Payer: Self-pay

## 2020-08-02 ENCOUNTER — Ambulatory Visit: Payer: Medicare HMO | Admitting: Urgent Care

## 2020-08-02 ENCOUNTER — Encounter: Payer: Self-pay | Admitting: Urology

## 2020-08-02 ENCOUNTER — Ambulatory Visit
Admission: RE | Admit: 2020-08-02 | Discharge: 2020-08-02 | Disposition: A | Discharge status: Home / Self Care | Payer: Medicare HMO | Attending: Urology | Admitting: Urology

## 2020-08-02 DIAGNOSIS — E782 Mixed hyperlipidemia: Secondary | ICD-10-CM | POA: Diagnosis not present

## 2020-08-02 DIAGNOSIS — Z853 Personal history of malignant neoplasm of breast: Secondary | ICD-10-CM | POA: Insufficient documentation

## 2020-08-02 DIAGNOSIS — C671 Malignant neoplasm of dome of bladder: Secondary | ICD-10-CM | POA: Diagnosis not present

## 2020-08-02 DIAGNOSIS — C679 Malignant neoplasm of bladder, unspecified: Secondary | ICD-10-CM | POA: Insufficient documentation

## 2020-08-02 DIAGNOSIS — D494 Neoplasm of unspecified behavior of bladder: Secondary | ICD-10-CM

## 2020-08-02 DIAGNOSIS — R7303 Prediabetes: Secondary | ICD-10-CM | POA: Diagnosis not present

## 2020-08-02 DIAGNOSIS — I10 Essential (primary) hypertension: Secondary | ICD-10-CM | POA: Insufficient documentation

## 2020-08-02 DIAGNOSIS — I252 Old myocardial infarction: Secondary | ICD-10-CM | POA: Insufficient documentation

## 2020-08-02 HISTORY — PX: TRANSURETHRAL RESECTION OF BLADDER TUMOR WITH MITOMYCIN-C: SHX6459

## 2020-08-02 LAB — GLUCOSE, CAPILLARY: Glucose-Capillary: 115 mg/dL — ABNORMAL HIGH (ref 70–99)

## 2020-08-02 SURGERY — TRANSURETHRAL RESECTION OF BLADDER TUMOR WITH MITOMYCIN-C
Anesthesia: General

## 2020-08-02 MED ORDER — SODIUM CHLORIDE 0.9 % IV SOLN
INTRAVENOUS | Status: DC
  Administered 2020-08-02: 100 mL/h via INTRAVENOUS

## 2020-08-02 MED ORDER — CHLORHEXIDINE GLUCONATE 0.12 % MT SOLN: 15.0000 mL | Freq: Once | OROMUCOSAL | Status: AC

## 2020-08-02 MED ORDER — HYDROCODONE-ACETAMINOPHEN 7.5-325 MG PO TABS
1.0000 | ORAL_TABLET | Freq: Once | ORAL | Status: DC | PRN
  Filled 2020-08-02: qty 1

## 2020-08-02 MED ORDER — FENTANYL CITRATE (PF) 100 MCG/2ML IJ SOLN
INTRAMUSCULAR | Status: AC
  Filled 2020-08-02: qty 2

## 2020-08-02 MED ORDER — GEMCITABINE CHEMO FOR BLADDER INSTILLATION 2000 MG
INTRAVENOUS | Status: DC | PRN
  Administered 2020-08-02: 2000 mg via INTRAVESICAL

## 2020-08-02 MED ORDER — ONDANSETRON HCL 4 MG/2ML IJ SOLN
INTRAMUSCULAR | Status: DC | PRN
  Administered 2020-08-02: 4 mg via INTRAVENOUS

## 2020-08-02 MED ORDER — MEPERIDINE HCL 25 MG/ML IJ SOLN: 6.2500 mg | INTRAMUSCULAR | Status: DC | PRN

## 2020-08-02 MED ORDER — LACTATED RINGERS IV SOLN: INTRAVENOUS | Status: DC

## 2020-08-02 MED ORDER — APREPITANT 40 MG PO CAPS: 40.0000 mg | ORAL_CAPSULE | Freq: Once | ORAL | Status: AC

## 2020-08-02 MED ORDER — FENTANYL CITRATE (PF) 100 MCG/2ML IJ SOLN
INTRAMUSCULAR | Status: DC | PRN
  Administered 2020-08-02: 25 ug via INTRAVENOUS

## 2020-08-02 MED ORDER — DROPERIDOL 2.5 MG/ML IJ SOLN
0.6250 mg | Freq: Once | INTRAMUSCULAR | Status: DC | PRN
  Filled 2020-08-02: qty 2

## 2020-08-02 MED ORDER — CEFAZOLIN SODIUM-DEXTROSE 2-4 GM/100ML-% IV SOLN
INTRAVENOUS | Status: AC
  Filled 2020-08-02: qty 100

## 2020-08-02 MED ORDER — APREPITANT 40 MG PO CAPS
ORAL_CAPSULE | ORAL | Status: AC
  Administered 2020-08-02: 40 mg via ORAL
  Filled 2020-08-02: qty 1

## 2020-08-02 MED ORDER — PROPOFOL 10 MG/ML IV BOLUS
INTRAVENOUS | Status: DC | PRN
Start: 2020-08-02 — End: 2020-08-02
  Administered 2020-08-02: 50 mg via INTRAVENOUS
  Administered 2020-08-02: 150 mg via INTRAVENOUS

## 2020-08-02 MED ORDER — DEXAMETHASONE SODIUM PHOSPHATE 10 MG/ML IJ SOLN
INTRAMUSCULAR | Status: DC | PRN
Start: 2020-08-02 — End: 2020-08-02
  Administered 2020-08-02: 5 mg via INTRAVENOUS

## 2020-08-02 MED ORDER — ONDANSETRON HCL 4 MG/2ML IJ SOLN: 4.0000 mg | Freq: Once | INTRAMUSCULAR | Status: DC | PRN

## 2020-08-02 MED ORDER — FENTANYL CITRATE (PF) 100 MCG/2ML IJ SOLN
25.0000 ug | INTRAMUSCULAR | Status: DC | PRN
  Administered 2020-08-02: 50 ug via INTRAVENOUS

## 2020-08-02 MED ORDER — ORAL CARE MOUTH RINSE: 15.0000 mL | Freq: Once | OROMUCOSAL | Status: AC

## 2020-08-02 MED ORDER — FENTANYL CITRATE (PF) 100 MCG/2ML IJ SOLN
INTRAMUSCULAR | Status: AC
  Administered 2020-08-02: 50 ug via INTRAVENOUS
  Filled 2020-08-02: qty 2

## 2020-08-02 MED ORDER — 0.9 % SODIUM CHLORIDE (POUR BTL) OPTIME
TOPICAL | Status: DC | PRN
  Administered 2020-08-02: 1000 mL

## 2020-08-02 MED ORDER — CHLORHEXIDINE GLUCONATE 0.12 % MT SOLN
OROMUCOSAL | Status: AC
  Administered 2020-08-02: 15 mL via OROMUCOSAL
  Filled 2020-08-02: qty 15

## 2020-08-02 MED ORDER — LIDOCAINE HCL (CARDIAC) PF 100 MG/5ML IV SOSY
PREFILLED_SYRINGE | INTRAVENOUS | Status: DC | PRN
  Administered 2020-08-02: 60 mg via INTRAVENOUS

## 2020-08-02 MED ORDER — CEFAZOLIN SODIUM-DEXTROSE 2-4 GM/100ML-% IV SOLN
2.0000 g | INTRAVENOUS | Status: AC
  Administered 2020-08-02: 2 g via INTRAVENOUS

## 2020-08-02 SURGICAL SUPPLY — 30 items
BAG DRAIN CYSTO-URO LG1000N (MISCELLANEOUS) ×2 IMPLANT
BAG DRN RND TRDRP ANRFLXCHMBR (UROLOGICAL SUPPLIES) ×1
BAG URINE DRAIN 2000ML AR STRL (UROLOGICAL SUPPLIES) ×2 IMPLANT
BRUSH SCRUB EZ  4% CHG (MISCELLANEOUS) ×1
BRUSH SCRUB EZ 4% CHG (MISCELLANEOUS) ×1 IMPLANT
CATH FOL 2WAY LX 18X30 (CATHETERS) ×2 IMPLANT
DRAPE UTILITY 15X26 TOWEL STRL (DRAPES) ×2 IMPLANT
DRSG TELFA 4X3 1S NADH ST (GAUZE/BANDAGES/DRESSINGS) ×2 IMPLANT
ELECT LOOP 22F BIPOLAR SML (ELECTROSURGICAL)
ELECT REM PT RETURN 9FT ADLT (ELECTROSURGICAL)
ELECTRODE LOOP 22F BIPOLAR SML (ELECTROSURGICAL) IMPLANT
ELECTRODE REM PT RTRN 9FT ADLT (ELECTROSURGICAL) IMPLANT
GAUZE 4X4 16PLY ~~LOC~~+RFID DBL (SPONGE) ×4 IMPLANT
GLOVE SURG UNDER POLY LF SZ7.5 (GLOVE) ×2 IMPLANT
GOWN STRL REUS W/ TWL LRG LVL3 (GOWN DISPOSABLE) ×1 IMPLANT
GOWN STRL REUS W/ TWL XL LVL3 (GOWN DISPOSABLE) ×1 IMPLANT
GOWN STRL REUS W/TWL LRG LVL3 (GOWN DISPOSABLE) ×2
GOWN STRL REUS W/TWL XL LVL3 (GOWN DISPOSABLE) ×2
KIT TURNOVER CYSTO (KITS) ×2 IMPLANT
LOOP CUT BIPOLAR 24F LRG (ELECTROSURGICAL) IMPLANT
MANIFOLD NEPTUNE II (INSTRUMENTS) ×2 IMPLANT
NDL SAFETY ECLIPSE 18X1.5 (NEEDLE) ×1 IMPLANT
NEEDLE HYPO 18GX1.5 SHARP (NEEDLE) ×2
PACK CYSTO AR (MISCELLANEOUS) ×2 IMPLANT
PAD ARMBOARD 7.5X6 YLW CONV (MISCELLANEOUS) ×2 IMPLANT
SET IRRIG Y TYPE TUR BLADDER L (SET/KITS/TRAYS/PACK) ×2 IMPLANT
SURGILUBE 2OZ TUBE FLIPTOP (MISCELLANEOUS) ×2 IMPLANT
SYR TOOMEY IRRIG 70ML (MISCELLANEOUS) ×2
SYRINGE TOOMEY IRRIG 70ML (MISCELLANEOUS) ×1 IMPLANT
WATER STERILE IRR 1000ML POUR (IV SOLUTION) ×2 IMPLANT

## 2020-08-02 NOTE — Anesthesia Procedure Notes (Signed)
Procedure Name: LMA Insertion Date/Time: 08/02/2020 11:26 AM Performed by: Hedda Slade, CRNA Pre-anesthesia Checklist: Patient identified, Patient being monitored, Timeout performed, Emergency Drugs available and Suction available Patient Re-evaluated:Patient Re-evaluated prior to induction Oxygen Delivery Method: Circle system utilized Preoxygenation: Pre-oxygenation with 100% oxygen Induction Type: IV induction Ventilation: Mask ventilation without difficulty LMA: LMA inserted LMA Size: 3.5 Tube type: Oral Number of attempts: 1 Placement Confirmation: positive ETCO2 and breath sounds checked- equal and bilateral Tube secured with: Tape Dental Injury: Teeth and Oropharynx as per pre-operative assessment

## 2020-08-02 NOTE — Transfer of Care (Signed)
Immediate Anesthesia Transfer of Care Note  Patient: Amber Galloway  Procedure(s) Performed: TRANSURETHRAL RESECTION OF BLADDER TUMOR WITH GEMCITABINE  Patient Location: PACU  Anesthesia Type:General  Level of Consciousness: awake, drowsy and patient cooperative  Airway & Oxygen Therapy: Patient Spontanous Breathing  Post-op Assessment: Report given to RN and Post -op Vital signs reviewed and stable  Post vital signs: Reviewed and stable  Last Vitals:  Vitals Value Taken Time  BP 140/65 08/02/20 1155  Temp    Pulse 69 08/02/20 1157  Resp 12 08/02/20 1157  SpO2 99 % 08/02/20 1157  Vitals shown include unvalidated device data.  Last Pain:  Vitals:   08/02/20 0921  TempSrc: Oral  PainSc: 0-No pain      Patients Stated Pain Goal: 0 ([id number])  Complications: No notable events documented.

## 2020-08-02 NOTE — Anesthesia Preprocedure Evaluation (Signed)
Anesthesia Evaluation  Patient identified by MRN, date of birth, ID band Patient awake    Reviewed: Allergy & Precautions, NPO status , Patient's Chart, lab work & pertinent test results  History of Anesthesia Complications (+) PONVNegative for: history of anesthetic complications  Airway Mallampati: II  TM Distance: <3 FB Neck ROM: Full    Dental no notable dental hx. (+) Teeth Intact   Pulmonary neg pulmonary ROS,    Pulmonary exam normal breath sounds clear to auscultation       Cardiovascular Exercise Tolerance: Good METS: 3 - Mets hypertension, + Past MI (Remote Asx-  able to do 4 METS)  Normal cardiovascular exam Rhythm:Regular Rate:Normal     Neuro/Psych Anxiety negative neurological ROS  negative psych ROS   GI/Hepatic negative GI ROS, Neg liver ROS, neg GERD  ,  Endo/Other  negative endocrine ROS  Renal/GU negative Renal ROS  negative genitourinary   Musculoskeletal  (+) Arthritis ,   Abdominal   Peds  Hematology negative hematology ROS (+)   Anesthesia Other Findings   Reproductive/Obstetrics negative OB ROS                             Anesthesia Physical Anesthesia Plan  ASA: 3  Anesthesia Plan: General ETT   Post-op Pain Management:    Induction: Intravenous  PONV Risk Score and Plan: Ondansetron and Dexamethasone  Airway Management Planned: Oral ETT  Additional Equipment:   Intra-op Plan:   Post-operative Plan: Extubation in OR  Informed Consent: I have reviewed the patients History and Physical, chart, labs and discussed the procedure including the risks, benefits and alternatives for the proposed anesthesia with the patient or authorized representative who has indicated his/her understanding and acceptance.     Dental Advisory Given  Plan Discussed with: Anesthesiologist, CRNA and Surgeon  Anesthesia Plan Comments: (Patient consented for risks of  anesthesia including but not limited to:  - adverse reactions to medications - damage to eyes, teeth, lips or other oral mucosa - nerve damage due to positioning  - sore throat or hoarseness - Damage to heart, brain, nerves, lungs, other parts of body or loss of life  Patient voiced understanding.)        Anesthesia Quick Evaluation

## 2020-08-02 NOTE — Op Note (Signed)
Date of procedure: 08/02/20  Preoperative diagnosis:  Bladder tumor  Postoperative diagnosis:  Same  Procedure: Cystoscopy, bladder biopsy and fulguration Instillation of intravesical gemcitabine  Surgeon: [name], MD  Anesthesia: General  Complications: None  Intraoperative findings:  Uncomplicated cold cup biopsy removal of papillary tumor, ~2cm Excellent hemostasis, and gemcitabine instilled  EBL: Minimal  Specimens: Bladder tumor  Drains: 18 French two-way Foley  Indication: Amber Galloway is a 80 y.o. 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  After reviewing the management options for treatment, they elected to proceed with the above surgical procedure(s). We have discussed the potential benefits and risks of the procedure, side effects of the proposed treatment, the likelihood of the patient achieving the goals of the procedure, and any potential problems that might occur during the procedure or recuperation. Informed consent has been obtained.  Description of procedure:  The patient was taken to the operating room and general anesthesia was induced. SCDs were placed for DVT prophylaxis. The patient was placed in the dorsal lithotomy position, prepped and draped in the usual sterile fashion, and preoperative antibiotics(Ancef) were administered. A preoperative time-out was performed.   A 21 French rigid cystoscope was used to intubate the urethra and thorough cystoscopy was performed.  The ureteral orifices were orthotopic bilaterally.  There was approximately 2 cm of adjacent papillary tumor at the left posterior dome, as well as a very small 5 mm lesion at the left bladder neck.  All tumor was completely removed with the cold cup biopsy forceps, and the Bugbee was used for meticulous hemostasis.  At the conclusion of the case there was excellent hemostasis with the bladder decompressed.  An 54 French Foley was placed and 10 mL placed in the  balloon, and 2 g / 50 mL of gemcitabine were instilled and clamped.  Disposition: Stable to PACU  Plan: Unclamp Foley and allow gemcitabine to drain at 12:45 PM, Foley can be removed prior to discharge  Follow-up pathology   [name], MD

## 2020-08-02 NOTE — H&P (Signed)
   08/02/20 11:07 AM   [name] [birth year] [id number]  CC: Bladder tumor  HPI: 80 year old female who presented with painless gross hematuria and cystoscopy was found to have multiple small bladder tumors.  Presents today for TURBT.   PMH: Past Medical History:  Diagnosis Date   Anxiety    Bladder tumor    Breast cancer ([place]) 08/2012   rt mastectomy   GERD (gastroesophageal reflux disease)    Headache    h/o migraines   Hyperlipidemia    Hypertension    Melanoma ([place])    Myocardial infarction ([place]) 1997   no stents   PONV (postoperative nausea and vomiting)    Pre-diabetes     Surgical History: Past Surgical History:  Procedure Laterality Date   APPENDECTOMY     CHOLECYSTECTOMY     COLONOSCOPY  2014   cleared   COLONOSCOPY WITH PROPOFOL N/A 07/03/2019   Procedure: COLONOSCOPY WITH PROPOFOL;  Surgeon: Toledo, Benay Pike, MD;  Location: [facility] ENDOSCOPY;  Service: Gastroenterology;  Laterality: N/A;   MASTECTOMY Right 08/2012   VAGINAL HYSTERECTOMY       Family History: Family History  Problem Relation Age of Onset   Breast cancer Sister 3   Brain cancer Daughter    Breast cancer Maternal Aunt 80    Social History:  reports that she has never smoked. She has never used smokeless tobacco. She reports previous alcohol use. She reports that she does not use drugs.  Physical Exam: BP (!) 153/78   Pulse 77   Temp 97.6 F (36.4 C) (Oral)   Resp 14   Ht '5\' 4"'$  (1.626 m)   Wt 62.6 kg   SpO2 97%   BMI 23.69 kg/m    Constitutional:  Alert and oriented, No acute distress. Cardiovascular: Regular rate and rhythm Respiratory: Regular rate and rhythm   Laboratory Data: Urine culture 7/14 no growth  Assessment & Plan:   80 year old female with small papillary bladder tumors on clinic cystoscopy, here today for TURBT.  We discussed transurethral resection of bladder tumor (TURBT) and risks and benefits at length. This is typically a 1 to 2-hour procedure  done under general anesthesia in the operating room.  A scope is inserted through the urethra and used to resect abnormal tissue within the bladder, which is then sent to the pathologist to determine grade and stage of the tumor.  Risks include bleeding, infection, need for temporary Foley placement, and bladder perforation.  Treatment strategies are based on the type of tumor and depth of invasion.  We briefly reviewed the different treatment pathways for non-muscle invasive and muscle invasive bladder cancer.   [name], MD 08/02/2020  [facility] [address], [place], [place] [address] [phone]

## 2020-08-02 NOTE — Discharge Instructions (Signed)

## 2020-08-03 NOTE — Anesthesia Postprocedure Evaluation (Signed)
Anesthesia Post Note  Patient: GLENNYS SHIRAH  Procedure(s) Performed: TRANSURETHRAL RESECTION OF BLADDER TUMOR WITH GEMCITABINE  Patient location during evaluation: PACU Anesthesia Type: General Level of consciousness: awake and alert Pain management: pain level controlled Vital Signs Assessment: post-procedure vital signs reviewed and stable Respiratory status: spontaneous breathing, nonlabored ventilation, respiratory function stable and patient connected to nasal cannula oxygen Cardiovascular status: blood pressure returned to baseline and stable Postop Assessment: no apparent nausea or vomiting Anesthetic complications: no   No notable events documented.   Last Vitals:  Vitals:   08/02/20 1322 08/02/20 1351  BP: (!) 141/51 (!) 133/52  Pulse: 77 73  Resp: 16 18  Temp: (!) 36.1 C   SpO2: 99% 100%    Last Pain:  Vitals:   08/02/20 1322  TempSrc: Temporal  PainSc: 1                  Tonny Bollman

## 2020-08-05 LAB — SURGICAL PATHOLOGY

## 2020-08-06 ENCOUNTER — Encounter: Payer: Self-pay | Admitting: Urology

## 2020-08-15 ENCOUNTER — Ambulatory Visit: Payer: Medicare HMO | Admitting: Urology

## 2020-08-15 ENCOUNTER — Other Ambulatory Visit: Payer: Self-pay

## 2020-08-15 ENCOUNTER — Encounter: Payer: Self-pay | Admitting: Urology

## 2020-08-15 VITALS — BP 148/74 | HR 76 | Ht 65.0 in | Wt 139.0 lb

## 2020-08-15 DIAGNOSIS — C674 Malignant neoplasm of posterior wall of bladder: Secondary | ICD-10-CM | POA: Diagnosis not present

## 2020-08-15 NOTE — Progress Notes (Signed)
   08/15/2020 9:49 AM   Amber Galloway [birth year] [id number]  Reason for visit: Discuss pathology  HPI: 80 year old female who presented with gross hematuria and lower pelvic pain and on clinic cystoscopy was found to have ~2cm of papillary appearing tumor at the posterior bladder wall.  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  Muscle was present and negative for tumor.  We discussed her new diagnosis of bladder cancer, and reviewed the AUA guidelines regarding nonmuscle invasive bladder cancer at length.  We discussed options moving forward including second look TURBT, induction BCG/gemcitabine, or surveillance alone with cystoscopy.  With her age, BCG shortage, and low volume tumor, I think surveillance alone is a reasonable option moving forward, and she is in agreement with this plan.  We discussed the risks and benefits at length.  We discussed the risk of recurrence at length.  She is a non-smoker.  She is having some overactive symptoms of urgency postop, and I encouraged her that these will likely continue to improve.  If not improved over the next few weeks would recommend repeat urinalysis.  RTC 3 months surveillance cystoscopy for intermediate risk bladder cancer  [name], MD  [facility] [address] [place], [place] [address] [phone]

## 2020-08-15 NOTE — Patient Instructions (Signed)
Cystoscopy Cystoscopy is a procedure that is used to help diagnose and sometimes treat conditions that affect the lower urinary tract. The lower urinary tract includes the bladder and the urethra. The urethra is the tube that drains urine from the bladder. Cystoscopy is done using a thin, tube-shaped instrument with a light and camera at the end (cystoscope). The cystoscope may be hard or flexible, depending on the goal of the procedure. The cystoscope is inserted through the urethra, into the bladder. Cystoscopy may be recommended if you have: Urinary tract infections that keep coming back. Blood in the urine (hematuria). An inability to control when you urinate (urinary incontinence) or an overactive bladder. Unusual cells found in a urine sample. A blockage in the urethra, such as a urinary stone. Painful urination. An abnormality in the bladder found during an intravenous pyelogram (IVP) or CT scan. Cystoscopy may also be done to remove a sample of tissue to be examined under a microscope (biopsy). What are the risks? Generally, this is a safe procedure. However, problems may occur, including: Infection. Bleeding.  What happens during the procedure?  You will be given one or more of the following: A medicine to numb the area (local anesthetic). The area around the opening of your urethra will be cleaned. The cystoscope will be passed through your urethra into your bladder. Germ-free (sterile) fluid will flow through the cystoscope to fill your bladder. The fluid will stretch your bladder so that your health care provider can clearly examine your bladder walls. Your doctor will look at the urethra and bladder. The cystoscope will be removed The procedure may vary among health care providers  What can I expect after the procedure? After the procedure, it is common to have: Some soreness or pain in your abdomen and urethra. Urinary symptoms. These include: Mild pain or burning when you  urinate. Pain should stop within a few minutes after you urinate. This may last for up to 1 week. A small amount of blood in your urine for several days. Feeling like you need to urinate but producing only a small amount of urine. Follow these instructions at home: General instructions Return to your normal activities as told by your health care provider.  Do not drive for 24 hours if you were given a sedative during your procedure. Watch for any blood in your urine. If the amount of blood in your urine increases, call your health care provider. If a tissue sample was removed for testing (biopsy) during your procedure, it is up to you to get your test results. Ask your health care provider, or the department that is doing the test, when your results will be ready. Drink enough fluid to keep your urine pale yellow. Keep all follow-up visits as told by your health care provider. This is important. Contact a health care provider if you: Have pain that gets worse or does not get better with medicine, especially pain when you urinate. Have trouble urinating. Have more blood in your urine. Get help right away if you: Have blood clots in your urine. Have abdominal pain. Have a fever or chills. Are unable to urinate. Summary Cystoscopy is a procedure that is used to help diagnose and sometimes treat conditions that affect the lower urinary tract. Cystoscopy is done using a thin, tube-shaped instrument with a light and camera at the end. After the procedure, it is common to have some soreness or pain in your abdomen and urethra. Watch for any blood in your urine.   If the amount of blood in your urine increases, call your health care provider. If you were prescribed an antibiotic medicine, take it as told by your health care provider. Do not stop taking the antibiotic even if you start to feel better. This information is not intended to replace advice given to you by your health care provider. Make  sure you discuss any questions you have with your health care provider. Document Revised: 12/21/2017 Document Reviewed: 12/21/2017 Elsevier Patient Education  2020 [employer]  

## 2020-10-28 ENCOUNTER — Telehealth: Payer: Self-pay | Admitting: Family Medicine

## 2020-10-28 NOTE — Telephone Encounter (Signed)
Copied from [place] [id number]. Topic: Medicare AWV >> Oct 28, 2020 12:42 PM Cher Nakai R wrote: Reason for CRM: LM to let patient know AWVS will be a phone call not in office -srs

## 2020-10-30 ENCOUNTER — Ambulatory Visit (INDEPENDENT_AMBULATORY_CARE_PROVIDER_SITE_OTHER): Payer: Medicare HMO

## 2020-10-30 DIAGNOSIS — Z1231 Encounter for screening mammogram for malignant neoplasm of breast: Secondary | ICD-10-CM | POA: Diagnosis not present

## 2020-10-30 DIAGNOSIS — Z0000 Encounter for general adult medical examination without abnormal findings: Secondary | ICD-10-CM | POA: Diagnosis not present

## 2020-10-30 NOTE — Progress Notes (Signed)
Subjective:   Amber Galloway is a 80 y.o. female who presents for Medicare Annual (Subsequent) preventive examination.  Virtual Visit via Telephone Note  I connected with  Amber Galloway on 10/30/20 at 11:20 AM EDT by telephone and verified that I am speaking with the correct person using two identifiers.  Location: Patient: home Provider: [facility] participating in the virtual visit: [place]   I discussed the limitations, risks, security and privacy concerns of performing an evaluation and management service by telephone and the availability of in person appointments. The patient expressed understanding and agreed to proceed.  Interactive audio and video telecommunications were attempted between this nurse and patient, however failed, due to patient having technical difficulties OR patient did not have access to video capability.  We continued and completed visit with audio only.  Some vital signs may be absent or patient reported.   Amber Marker, LPN   Review of Systems     Cardiac Risk Factors include: advanced age (>11men, >42 women);hypertension;dyslipidemia     Objective:    There were no vitals filed for this visit. There is no height or weight on file to calculate BMI.  Advanced Directives 10/30/2020 08/02/2020 07/29/2020 10/25/2019 07/03/2019 05/20/2015 11/05/2014  Does Patient Have a Medical Advance Directive? No No No No No No No  Would patient like information on creating a medical advance directive? No - Patient declined No - Patient declined - No - Patient declined No - Patient declined No - patient declined information No - patient declined information    Current Medications (verified) Outpatient Encounter Medications as of 10/30/2020  Medication Sig   ALPRAZolam (XANAX) 0.25 MG tablet Take 0.25 mg by mouth daily as needed for anxiety.   aspirin EC 81 MG tablet Take 81 mg by mouth daily.   BIOTIN PO Take 1 tablet by mouth daily.    cetirizine (ZYRTEC) 10 MG tablet Take 10 mg by mouth daily as needed for allergies.   gemfibrozil (LOPID) 600 MG tablet Take 600 mg by mouth at bedtime.   hydrochlorothiazide (MICROZIDE) 12.5 MG capsule Take 12.5 mg by mouth daily.   meclizine (ANTIVERT) 25 MG tablet Take 1 tablet (25 mg total) by mouth 3 (three) times daily as needed for dizziness.   metoprolol tartrate (LOPRESSOR) 50 MG tablet Take 1 tablet (50 mg total) by mouth 2 (two) times daily.   Multiple Vitamin (MULTIVITAMIN WITH MINERALS) TABS tablet Take 1 tablet by mouth daily.   Omega-3 Fatty Acids (FISH OIL PO) Take 1 capsule by mouth daily.   omeprazole (PRILOSEC) 20 MG capsule TAKE 1 CAPSULE BY MOUTH EVERY DAY   simvastatin (ZOCOR) 80 MG tablet Take 1 tablet (80 mg total) by mouth daily.   [DISCONTINUED] hydrochlorothiazide (HYDRODIURIL) 12.5 MG tablet Take 12.5 mg by mouth daily.   [DISCONTINUED] meloxicam (MOBIC) 7.5 MG tablet Take 1 tablet (7.5 mg total) by mouth daily.   No facility-administered encounter medications on file as of 10/30/2020.    Allergies (verified) Patient has no known allergies.   History: Past Medical History:  Diagnosis Date   Anxiety    Bladder tumor    Breast cancer ([place]) 08/2012   rt mastectomy   GERD (gastroesophageal reflux disease)    Headache    h/o migraines   Hyperlipidemia    Hypertension    Melanoma ([place])    Myocardial infarction ([place]) 1997   no stents   PONV (postoperative nausea and vomiting)    Pre-diabetes  Past Surgical History:  Procedure Laterality Date   APPENDECTOMY     CHOLECYSTECTOMY     COLONOSCOPY  2014   cleared   COLONOSCOPY WITH PROPOFOL N/A 07/03/2019   Procedure: COLONOSCOPY WITH PROPOFOL;  Surgeon: Toledo, Benay Pike, MD;  Location: [facility] ENDOSCOPY;  Service: Gastroenterology;  Laterality: N/A;   MASTECTOMY Right 08/2012   TRANSURETHRAL RESECTION OF BLADDER TUMOR WITH MITOMYCIN-C N/A 08/02/2020   Procedure: TRANSURETHRAL RESECTION OF BLADDER TUMOR WITH  GEMCITABINE;  Surgeon: [name], MD;  Location: [facility] ORS;  Service: Urology;  Laterality: N/A;   VAGINAL HYSTERECTOMY     Family History  Problem Relation Age of Onset   Breast cancer Sister 76   Brain cancer Daughter    Breast cancer Maternal Aunt 80   Social History   Socioeconomic History   Marital status: Widowed    Spouse name: Not on file   Number of children: 1   Years of education: 12   Highest education level: High school graduate  Occupational History   Occupation: Retired  Tobacco Use   Smoking status: Never   Smokeless tobacco: Never  Vaping Use   Vaping Use: Never used  Substance and Sexual Activity   Alcohol use: Not Currently   Drug use: Never   Sexual activity: Not Currently    Partners: Male  Other Topics Concern   Not on file  Social History Narrative   Pt lives alone.   Feels safe in her home. Neighbors nearby.   Granddaughter will keep patient after surgery.   Social Determinants of Health   Financial Resource Strain: Low Risk    Difficulty of Paying Living Expenses: Not very hard  Food Insecurity: No Food Insecurity   Worried About Charity fundraiser in the Last Year: Never true   Ran Out of Food in the Last Year: Never true  Transportation Needs: No Transportation Needs   Lack of Transportation (Medical): No   Lack of Transportation (Non-Medical): No  Physical Activity: Inactive   Days of Exercise per Week: 0 days   Minutes of Exercise per Session: 0 min  Stress: No Stress Concern Present   Feeling of Stress : Not at all  Social Connections: Moderately Isolated   Frequency of Communication with Friends and Family: More than three times a week   Frequency of Social Gatherings with Friends and Family: Three times a week   Attends Religious Services: More than 4 times per year   Active Member of Clubs or Organizations: No   Attends Archivist Meetings: Never   Marital Status: Widowed    Tobacco Counseling Counseling  given: Not Answered   Clinical Intake:  Pre-visit preparation completed: Yes  Pain : No/denies pain     Nutritional Risks: None Diabetes: No  How often do you need to have someone help you when you read instructions, pamphlets, or other written materials from your doctor or pharmacy?: 1 - Never    Interpreter Needed?: No  Information entered by :: Amber Marker LPN   Activities of Daily Living In your present state of health, do you have any difficulty performing the following activities: 10/30/2020 08/02/2020  Hearing? N N  Vision? N N  Difficulty concentrating or making decisions? N N  Walking or climbing stairs? N N  Dressing or bathing? N N  Doing errands, shopping? N -  Preparing Food and eating ? N -  Using the Toilet? N -  In the past six months, have you accidently  leaked urine? Y -  Comment wears pads for protection -  Do you have problems with loss of bowel control? N -  Managing your Medications? N -  Managing your Finances? N -  Housekeeping or managing your Housekeeping? N -  Some recent data might be hidden    Patient Care Team: Juline Patch, MD as PCP - General (Family Medicine)  Indicate any recent Medical Services you may have received from other than [facility] providers in the past year (date may be approximate).     Assessment:   This is a routine wellness examination for Tawny.  Hearing/Vision screen Hearing Screening - Comments:: Pt denies hearing difficulty Vision Screening - Comments:: Annual vision screenings done by Dr. Mallie Mussel  Dietary issues and exercise activities discussed: Current Exercise Habits: The patient does not participate in regular exercise at present, Exercise limited by: None identified   Goals Addressed             This Visit's Progress    DIET - INCREASE WATER INTAKE       Recommend drinking 6-8 glasses of water per day         Depression Screen PHQ 2/9 Scores 10/30/2020 07/04/2020 06/20/2020 05/23/2020  10/25/2019 02/06/2019 08/19/2018  PHQ - 2 Score 2 0 0 0 0 0 0  PHQ- 9 Score 4 0 0 0 - 0 0    Fall Risk Fall Risk  10/30/2020 07/04/2020 06/20/2020 10/25/2019 02/06/2019  Falls in the past year? 0 0 0 0 0  Number falls in past yr: 0 0 0 0 -  Injury with Fall? 0 0 0 0 -  Risk for fall due to : No Fall Risks Orthopedic patient Orthopedic patient No Fall Risks -  Follow up Falls prevention discussed Falls evaluation completed Falls evaluation completed Falls prevention discussed Falls evaluation completed    [place]:  Any stairs in or around the home? Yes  If so, are there any without handrails? No  Home free of loose throw rugs in walkways, pet beds, electrical cords, etc? Yes  Adequate lighting in your home to reduce risk of falls? Yes   ASSISTIVE DEVICES UTILIZED TO PREVENT FALLS:  Life alert? No  Use of a cane, walker or w/c? No  Grab bars in the bathroom? Yes  Shower chair or bench in shower? Yes  Elevated toilet seat or a handicapped toilet? Yes   TIMED UP AND GO:  Was the test performed? No . Telephonic visit.   Cognitive Function: Normal cognitive status assessed by direct observation by this Nurse Health Advisor. No abnormalities found.          Immunizations Immunization History  Administered Date(s) Administered   Fluad Quad(high Dose 65+) 09/12/2018, 10/03/2019   Influenza, High Dose Seasonal PF 01/18/2017   Influenza,inj,Quad PF,6+ Mos 11/05/2014, 11/11/2015, 09/14/2017   Influenza-Unspecified 11/05/2014, 11/11/2015, 09/14/2017   Moderna Sars-Covid-2 Vaccination 12/27/2019   PFIZER(Purple Top)SARS-COV-2 Vaccination 05/11/2019, 06/06/2019   Pneumococcal Conjugate-13 11/05/2014   Pneumococcal Polysaccharide-23 03/01/2017   Tdap 07/20/2015    TDAP status: Up to date  Flu Vaccine status: Due, Education has been provided regarding the importance of this vaccine. Advised may receive this vaccine at local pharmacy or Health Dept.  Aware to provide a copy of the vaccination record if obtained from local pharmacy or Health Dept. Verbalized acceptance and understanding.  Pneumococcal vaccine status: Up to date  Covid-19 vaccine status: Completed vaccines  Qualifies for Shingles Vaccine? Yes  Zostavax completed No   Shingrix Completed?: Yes - per patient. Will contact [facility] for records  Screening Tests Health Maintenance  Topic Date Due   Zoster Vaccines- Shingrix (1 of 2) Never done   COVID-19 Vaccine (4 - Booster) 03/20/2020   INFLUENZA VACCINE  08/12/2020   TETANUS/TDAP  07/19/2025   Pneumonia Vaccine 33+ Years old  Completed   DEXA SCAN  Completed   HPV VACCINES  Aged Out    Health Maintenance  Health Maintenance Due  Topic Date Due   Zoster Vaccines- Shingrix (1 of 2) Never done   COVID-19 Vaccine (4 - Booster) 03/20/2020   INFLUENZA VACCINE  08/12/2020    Colorectal cancer screening: No longer required.   Mammogram status: Completed 10/16/19. Repeat every year  Bone Density status: Completed 03/08/17. Results reflect: Bone density results: OSTEOPENIA. Repeat every 2 years. Pt declines repeat screening at this time.   Lung Cancer Screening: (Low Dose CT Chest recommended if Age 12-80 years, 30 pack-year currently smoking OR have quit w/in 15years.) does not qualify.   Additional Screening:  Hepatitis C Screening: does not qualify.  Vision Screening: Recommended annual ophthalmology exams for early detection of glaucoma and other disorders of the eye. Is the patient up to date with their annual eye exam?  No  Who is the provider or what is the name of the office in which the patient attends annual eye exams? Dr. Mallie Mussel.   Dental Screening: Recommended annual dental exams for proper oral hygiene  Community Resource Referral / Chronic Care Management: CRR required this visit?  No   CCM required this visit?  No      Plan:     I have personally reviewed and noted the following in the  patient's chart:   Medical and social history Use of alcohol, tobacco or illicit drugs  Current medications and supplements including opioid prescriptions.  Functional ability and status Nutritional status Physical activity Advanced directives List of other physicians Hospitalizations, surgeries, and ER visits in previous 12 months Vitals Screenings to include cognitive, depression, and falls Referrals and appointments  In addition, I have reviewed and discussed with patient certain preventive protocols, quality metrics, and best practice recommendations. A written personalized care plan for preventive services as well as general preventive health recommendations were provided to patient.     Amber Marker, LPN   [id number]   Nurse Notes: pt scheduled for office visit tomorrow with Dr. Ronnald Ramp for possible yeast infection/UTI.

## 2020-10-30 NOTE — Patient Instructions (Signed)
Amber Galloway , Thank you for taking time to come for your Medicare Wellness Visit. I appreciate your ongoing commitment to your health goals. Please review the following plan we discussed and let me know if I can assist you in the future.   Screening recommendations/referrals: Colonoscopy: no longer required.  Mammogram: done 10/16/19 Bone Density: done 03/08/17 Recommended yearly ophthalmology/optometry visit for glaucoma screening and checkup Recommended yearly dental visit for hygiene and checkup  Vaccinations: Influenza vaccine: due Pneumococcal vaccine: done 03/01/17 Tdap vaccine: done 07/20/15 Shingles vaccine: we will [facility] vaccine record   Covid-19:done 05/11/19, 06/06/19 & 12/27/19  Advanced directives: Advance directive discussed with you today. Even though you declined this today please call our office should you change your mind and we can give you the proper paperwork for you to fill out.   Conditions/risks identified: Recommend increasing physical activity   Next appointment: Follow up in one year for your annual wellness visit    Preventive Care 65 Years and Older, Female Preventive care refers to lifestyle choices and visits with your health care provider that can promote health and wellness. What does preventive care include? A yearly physical exam. This is also called an annual well check. Dental exams once or twice a year. Routine eye exams. Ask your health care provider how often you should have your eyes checked. Personal lifestyle choices, including: Daily care of your teeth and gums. Regular physical activity. Eating a healthy diet. Avoiding tobacco and drug use. Limiting alcohol use. Practicing safe sex. Taking low-dose aspirin every day. Taking vitamin and mineral supplements as recommended by your health care provider. What happens during an annual well check? The services and screenings done by your health care provider during your annual well  check will depend on your age, overall health, lifestyle risk factors, and family history of disease. Counseling  Your health care provider may ask you questions about your: Alcohol use. Tobacco use. Drug use. Emotional well-being. Home and relationship well-being. Sexual activity. Eating habits. History of falls. Memory and ability to understand (cognition). Work and work Statistician. Reproductive health. Screening  You may have the following tests or measurements: Height, weight, and BMI. Blood pressure. Lipid and cholesterol levels. These may be checked every 5 years, or more frequently if you are over 44 years old. Skin check. Lung cancer screening. You may have this screening every year starting at age 82 if you have a 30-pack-year history of smoking and currently smoke or have quit within the past 15 years. Fecal occult blood test (FOBT) of the stool. You may have this test every year starting at age 82. Flexible sigmoidoscopy or colonoscopy. You may have a sigmoidoscopy every 5 years or a colonoscopy every 10 years starting at age 82. Hepatitis C blood test. Hepatitis B blood test. Sexually transmitted disease (STD) testing. Diabetes screening. This is done by checking your blood sugar (glucose) after you have not eaten for a while (fasting). You may have this done every 1-3 years. Bone density scan. This is done to screen for osteoporosis. You may have this done starting at age 82. Mammogram. This may be done every 1-2 years. Talk to your health care provider about how often you should have regular mammograms. Talk with your health care provider about your test results, treatment options, and if necessary, the need for more tests. Vaccines  Your health care provider may recommend certain vaccines, such as: Influenza vaccine. This is recommended every year. Tetanus, diphtheria, and acellular pertussis (  Tdap, Td) vaccine. You may need a Td booster every 10 years. Zoster  vaccine. You may need this after age 82. Pneumococcal 13-valent conjugate (PCV13) vaccine. One dose is recommended after age 82. Pneumococcal polysaccharide (PPSV23) vaccine. One dose is recommended after age 82. Talk to your health care provider about which screenings and vaccines you need and how often you need them. This information is not intended to replace advice given to you by your health care provider. Make sure you discuss any questions you have with your health care provider. Document Released: 01/25/2015 Document Revised: 09/18/2015 Document Reviewed: 10/30/2014 Elsevier Interactive Patient Education  2017 [employer] Prevention in the Home Falls can cause injuries. They can happen to people of all ages. There are many things you can do to make your home safe and to help prevent falls. What can I do on the outside of my home? Regularly fix the edges of walkways and driveways and fix any cracks. Remove anything that might make you trip as you walk through a door, such as a raised step or threshold. Trim any bushes or trees on the path to your home. Use bright outdoor lighting. Clear any walking paths of anything that might make someone trip, such as rocks or tools. Regularly check to see if handrails are loose or broken. Make sure that both sides of any steps have handrails. Any raised decks and porches should have guardrails on the edges. Have any leaves, snow, or ice cleared regularly. Use sand or salt on walking paths during winter. Clean up any spills in your garage right away. This includes oil or grease spills. What can I do in the bathroom? Use night lights. Install grab bars by the toilet and in the tub and shower. Do not use towel bars as grab bars. Use non-skid mats or decals in the tub or shower. If you need to sit down in the shower, use a plastic, non-slip stool. Keep the floor dry. Clean up any water that spills on the floor as soon as it happens. Remove  soap buildup in the tub or shower regularly. Attach bath mats securely with double-sided non-slip rug tape. Do not have throw rugs and other things on the floor that can make you trip. What can I do in the bedroom? Use night lights. Make sure that you have a light by your bed that is easy to reach. Do not use any sheets or blankets that are too big for your bed. They should not hang down onto the floor. Have a firm chair that has side arms. You can use this for support while you get dressed. Do not have throw rugs and other things on the floor that can make you trip. What can I do in the kitchen? Clean up any spills right away. Avoid walking on wet floors. Keep items that you use a lot in easy-to-reach places. If you need to reach something above you, use a strong step stool that has a grab bar. Keep electrical cords out of the way. Do not use floor polish or wax that makes floors slippery. If you must use wax, use non-skid floor wax. Do not have throw rugs and other things on the floor that can make you trip. What can I do with my stairs? Do not leave any items on the stairs. Make sure that there are handrails on both sides of the stairs and use them. Fix handrails that are broken or loose. Make sure that handrails are  as long as the stairways. Check any carpeting to make sure that it is firmly attached to the stairs. Fix any carpet that is loose or worn. Avoid having throw rugs at the top or bottom of the stairs. If you do have throw rugs, attach them to the floor with carpet tape. Make sure that you have a light switch at the top of the stairs and the bottom of the stairs. If you do not have them, ask someone to add them for you. What else can I do to help prevent falls? Wear shoes that: Do not have high heels. Have rubber bottoms. Are comfortable and fit you well. Are closed at the toe. Do not wear sandals. If you use a stepladder: Make sure that it is fully opened. Do not climb a  closed stepladder. Make sure that both sides of the stepladder are locked into place. Ask someone to hold it for you, if possible. Clearly mark and make sure that you can see: Any grab bars or handrails. First and last steps. Where the edge of each step is. Use tools that help you move around (mobility aids) if they are needed. These include: Canes. Walkers. Scooters. Crutches. Turn on the lights when you go into a dark area. Replace any light bulbs as soon as they burn out. Set up your furniture so you have a clear path. Avoid moving your furniture around. If any of your floors are uneven, fix them. If there are any pets around you, be aware of where they are. Review your medicines with your doctor. Some medicines can make you feel dizzy. This can increase your chance of falling. Ask your doctor what other things that you can do to help prevent falls. This information is not intended to replace advice given to you by your health care provider. Make sure you discuss any questions you have with your health care provider. Document Released: 10/25/2008 Document Revised: 06/06/2015 Document Reviewed: 02/02/2014 Elsevier Interactive Patient Education  2017 [employer].

## 2020-10-31 ENCOUNTER — Ambulatory Visit (INDEPENDENT_AMBULATORY_CARE_PROVIDER_SITE_OTHER): Payer: Medicare HMO | Admitting: Family Medicine

## 2020-10-31 ENCOUNTER — Encounter: Payer: Self-pay | Admitting: Family Medicine

## 2020-10-31 ENCOUNTER — Other Ambulatory Visit: Payer: Self-pay

## 2020-10-31 VITALS — BP 124/74 | HR 84 | Ht 65.0 in | Wt 143.0 lb

## 2020-10-31 DIAGNOSIS — R822 Biliuria: Secondary | ICD-10-CM

## 2020-10-31 DIAGNOSIS — N3090 Cystitis, unspecified without hematuria: Secondary | ICD-10-CM | POA: Diagnosis not present

## 2020-10-31 LAB — POCT URINALYSIS DIPSTICK
Bilirubin, UA: NEGATIVE
Blood, UA: NEGATIVE
Glucose, UA: NEGATIVE
Ketones, UA: NEGATIVE
Nitrite, UA: NEGATIVE
Protein, UA: NEGATIVE
Spec Grav, UA: 1.015 (ref 1.010–1.025)
Urobilinogen, UA: 4 E.U./dL — AB
pH, UA: 7.5 (ref 5.0–8.0)

## 2020-10-31 MED ORDER — CEPHALEXIN 500 MG PO CAPS: 500.0000 mg | ORAL_CAPSULE | Freq: Three times a day (TID) | ORAL | 0 refills | Status: DC

## 2020-10-31 NOTE — Progress Notes (Signed)
Date:  10/31/2020   Name:  Amber Galloway   DOB:  Mar 09, 1940   MRN:  [id number]   Chief Complaint: No chief complaint on file.  Urinary Tract Infection  This is a new problem. The current episode started in the past 7 days. The problem occurs every urination. The problem has been unchanged. She is Not sexually active. Pertinent negatives include no frequency, hematuria or hesitancy. She has tried nothing for the symptoms. The treatment provided moderate relief.   Lab Results  Component Value Date   CREATININE 0.82 05/23/2020   BUN 12 05/23/2020   NA 139 05/23/2020   K 3.6 07/30/2020   CL 97 05/23/2020   CO2 26 05/23/2020   Lab Results  Component Value Date   CHOL 188 05/23/2020   HDL 57 05/23/2020   LDLCALC 115 (H) 05/23/2020   TRIG 87 05/23/2020   CHOLHDL 3.7 03/01/2017   No results found for: TSH Lab Results  Component Value Date   HGBA1C 6.8 (H) 05/23/2020   Lab Results  Component Value Date   WBC 7.2 07/30/2020   HGB 12.9 07/30/2020   HCT 38.1 07/30/2020   MCV 89.2 07/30/2020   PLT 182 07/30/2020   Lab Results  Component Value Date   ALT 14 05/23/2020   AST 20 05/23/2020   ALKPHOS 84 05/23/2020   BILITOT 0.6 05/23/2020     Review of Systems  Genitourinary:  Negative for frequency, hematuria and hesitancy.   Patient Active Problem List   Diagnosis Date Noted   Primary osteoarthritis of left knee 06/20/2020   Pain and swelling of left lower leg 06/20/2020   Pelvic pain 10/25/2019   Acute anxiety 08/10/2017   Overweight (BMI 25.0-29.9) 03/01/2017   Dizziness 06/05/2016   Essential hypertension 06/06/2015   Mixed hyperlipidemia 06/06/2015   Gastroesophageal reflux disease without esophagitis 06/06/2015   History of melanoma in situ 05/21/2014    No Known Allergies  Past Surgical History:  Procedure Laterality Date   APPENDECTOMY     CHOLECYSTECTOMY     COLONOSCOPY  2014   cleared   COLONOSCOPY WITH PROPOFOL N/A 07/03/2019   Procedure:  COLONOSCOPY WITH PROPOFOL;  Surgeon: Toledo, Benay Pike, MD;  Location: [facility];  Service: Gastroenterology;  Laterality: N/A;   MASTECTOMY Right 08/2012   TRANSURETHRAL RESECTION OF BLADDER TUMOR WITH MITOMYCIN-C N/A 08/02/2020   Procedure: TRANSURETHRAL RESECTION OF BLADDER TUMOR WITH GEMCITABINE;  Surgeon: [name], MD;  Location: [facility] OR[facility];  Service: Urology;  Laterality: N/A;   VAGINAL HYSTERECTOMY      Social History   Tobacco Use   Smoking status: Never   Smokeless tobacco: Never  Vaping Use   Vaping Use: Never used  Substance Use Topics   Alcohol use: Not Currently   Drug use: Never     Medication list has been reviewed and updated.  Current Meds  Medication Sig   ALPRAZolam (XANAX) 0.25 MG tablet Take 0.25 mg by mouth daily as needed for anxiety.   aspirin EC 81 MG tablet Take 81 mg by mouth daily.   BIOTIN PO Take 1 tablet by mouth daily.   cetirizine (ZYRTEC) 10 MG tablet Take 10 mg by mouth daily as needed for allergies.   gemfibrozil (LOPID) 600 MG tablet Take 600 mg by mouth at bedtime.   hydrochlorothiazide (MICROZIDE) 12.5 MG capsule Take 12.5 mg by mouth daily.   meclizine (ANTIVERT) 25 MG tablet Take 1 tablet (25 mg total) by mouth 3 (three) times  daily as needed for dizziness.   metoprolol tartrate (LOPRESSOR) 50 MG tablet Take 1 tablet (50 mg total) by mouth 2 (two) times daily.   Multiple Vitamin (MULTIVITAMIN WITH MINERALS) TABS tablet Take 1 tablet by mouth daily.   Omega-3 Fatty Acids (FISH OIL PO) Take 1 capsule by mouth daily.   omeprazole (PRILOSEC) 20 MG capsule TAKE 1 CAPSULE BY MOUTH EVERY DAY   simvastatin (ZOCOR) 80 MG tablet Take 1 tablet (80 mg total) by mouth daily.    PHQ 2/9 Scores 10/30/2020 07/04/2020 06/20/2020 05/23/2020  PHQ - 2 Score 2 0 0 0  PHQ- 9 Score 4 0 0 0    GAD 7 : Generalized Anxiety Score 07/04/2020 06/20/2020 05/23/2020 02/06/2019  Nervous, Anxious, on Edge 0 0 0 0  Control/stop worrying 0 0 0 0  Worry too much -  different things 0 0 0 0  Trouble relaxing 0 0 0 0  Restless 0 0 0 0  Easily annoyed or irritable 0 0 0 0  Afraid - awful might happen 0 0 0 0  Total GAD 7 Score 0 0 0 0  Anxiety Difficulty Not difficult at all Not difficult at all - -    BP Readings from Last 3 Encounters:  10/31/20 124/74  08/15/20 (!) 148/74  08/02/20 (!) 133/52    Physical Exam Vitals and nursing note reviewed.  Constitutional:      Appearance: She is well-developed.  HENT:     Head: Normocephalic.     Right Ear: External ear normal.     Left Ear: External ear normal.  Eyes:     General: Lids are everted, no foreign bodies appreciated. No scleral icterus.       Left eye: No foreign body or hordeolum.     Conjunctiva/sclera: Conjunctivae normal.     Right eye: Right conjunctiva is not injected.     Left eye: Left conjunctiva is not injected.     Pupils: Pupils are equal, round, and reactive to light.  Neck:     Thyroid: No thyromegaly.     Vascular: No JVD.     Trachea: No tracheal deviation.  Cardiovascular:     Rate and Rhythm: Normal rate and regular rhythm.     Heart sounds: Normal heart sounds.  Pulmonary:     Effort: Pulmonary effort is normal.     Breath sounds: Normal breath sounds. No rales.  Abdominal:     General: Bowel sounds are normal.     Palpations: Abdomen is soft.     Tenderness: There is no abdominal tenderness.  Musculoskeletal:        General: No tenderness. Normal range of motion.     Cervical back: Normal range of motion and neck supple.  Lymphadenopathy:     Cervical: No cervical adenopathy.  Skin:    General: Skin is warm.     Findings: No rash.  Neurological:     Mental Status: She is alert and oriented to person, place, and time.     Cranial Nerves: No cranial nerve deficit.     Deep Tendon Reflexes: Reflexes normal.  Psychiatric:        Mood and Affect: Mood is not anxious or depressed.    Wt Readings from Last 3 Encounters:  10/31/20 143 lb (64.9 kg)   08/15/20 139 lb (63 kg)  08/02/20 138 lb (62.6 kg)    BP 124/74   Pulse 84   Ht 5\' 5"  (1.651 m)   Wt  143 lb (64.9 kg)   BMI 23.80 kg/m   Assessment and Plan:  1. Cystitis New onset.  Persistent.  Patient has noticed an odor upon urination for about a week.  History and eval is consistent with UTI and we will treat with cephalexin 500 mg 3 times a day for 3 days. - POCT Urinalysis Dipstick - cephALEXin (KEFLEX) 500 MG capsule; Take 1 capsule (500 mg total) by mouth 3 (three) times daily.  Dispense: 9 capsule; Refill: 0  2. Bilirubin in urine Bilirubin noted in the urine and we will check hepatic function panel. - Hepatic Function Panel (6)

## 2020-11-01 LAB — HEPATIC FUNCTION PANEL (6)
ALT: 12 IU/L (ref 0–32)
AST: 27 IU/L (ref 0–40)
Albumin: 4.7 g/dL — ABNORMAL HIGH (ref 3.6–4.6)
Alkaline Phosphatase: 92 IU/L (ref 44–121)
Bilirubin Total: 0.5 mg/dL (ref 0.0–1.2)
Bilirubin, Direct: 0.21 mg/dL (ref 0.00–0.40)

## 2020-11-14 ENCOUNTER — Telehealth: Payer: Self-pay | Admitting: Urology

## 2020-11-14 ENCOUNTER — Other Ambulatory Visit: Payer: Self-pay | Admitting: Urology

## 2020-11-14 ENCOUNTER — Other Ambulatory Visit: Payer: Self-pay

## 2020-11-14 ENCOUNTER — Ambulatory Visit: Payer: Medicare HMO | Admitting: Urology

## 2020-11-14 ENCOUNTER — Encounter: Payer: Self-pay | Admitting: Urology

## 2020-11-14 ENCOUNTER — Other Ambulatory Visit: Payer: Self-pay | Admitting: Family Medicine

## 2020-11-14 VITALS — BP 150/65 | HR 65 | Ht 65.0 in | Wt 143.0 lb

## 2020-11-14 DIAGNOSIS — Z8551 Personal history of malignant neoplasm of bladder: Secondary | ICD-10-CM | POA: Diagnosis not present

## 2020-11-14 DIAGNOSIS — E782 Mixed hyperlipidemia: Secondary | ICD-10-CM

## 2020-11-14 DIAGNOSIS — D494 Neoplasm of unspecified behavior of bladder: Secondary | ICD-10-CM

## 2020-11-14 LAB — URINALYSIS, COMPLETE
Bilirubin, UA: NEGATIVE
Glucose, UA: NEGATIVE
Ketones, UA: NEGATIVE
Nitrite, UA: NEGATIVE
Protein,UA: NEGATIVE
RBC, UA: NEGATIVE
Specific Gravity, UA: 1.015 (ref 1.005–1.030)
Urobilinogen, Ur: 2 mg/dL — ABNORMAL HIGH (ref 0.2–1.0)
pH, UA: 7 (ref 5.0–7.5)

## 2020-11-14 LAB — MICROSCOPIC EXAMINATION
Bacteria, UA: NONE SEEN
RBC, Urine: NONE SEEN /hpf (ref 0–2)

## 2020-11-14 MED ORDER — LIDOCAINE HCL URETHRAL/MUCOSAL 2 % EX GEL
1.0000 | Freq: Once | CUTANEOUS | Status: AC
Start: 2020-11-14 — End: 2020-11-14
  Administered 2020-11-14: 1 via URETHRAL

## 2020-11-14 NOTE — Telephone Encounter (Signed)
Pt. Called and cancelled surgery due to her significant other having a stroke today. Pt. Will call back to reschedule at a later date.

## 2020-11-14 NOTE — Progress Notes (Signed)
Surgical Physician Order Form  * Scheduling expectation : Next Available  *Length of Case: 45 minutes  *Clearance needed: no  *Anticoagulation Instructions: Hold all anticoagulants  *Aspirin Instructions: Hold Aspirin  *Post-op visit Date/Instructions: Call with pathology results  *Diagnosis: Bladder Tumor  *Procedure: Cysto Bladder Biopsy (01484)  -Admit type: OUTpatient  -Anesthesia: General  -VTE Prophylaxis Standing Order SCD's       Other:   -Standing Lab Orders Per Anesthesia    Lab other: UA&Urine Culture  -Standing Test orders EKG/Chest x-ray per Anesthesia       Test other:   - Medications:     Ancef 2gm IV   Other Instructions:

## 2020-11-14 NOTE — Progress Notes (Signed)
[facility] Posting Form   Surgery Date/Time: Date: 11/04/20223  Surgeon: Dr. [name], MD  Surgery Location: Day Surgery  Inpt ( No  )   Outpt (Yes)   Obs ( No  )   Diagnosis: D49.4 Bladder Tumor  -CPT: 31517  Surgery: Cystoscopy with Bladder Biopsy  Stop Anticoagulations: Yes  Cardiac/Medical/Pulmonary Clearance needed: No  *Orders entered into EPIC  Date: 11/14/20   *Case booked in EPIC  Date: 11/14/20  *Notified pt of Surgery: Date: 11/14/20  PRE-OP UA & CX: Yes, Obtained in clinic 11/14/2020  *Placed into Prior Authorization Work Fabio Bering Date: 11/14/20   Assistant/laser/rep:No

## 2020-11-14 NOTE — Telephone Encounter (Signed)
Requested medications are due for refill today.  yes  Requested medications are on the active medications list.  yes  Last refill. 08/15/2020  Future visit scheduled.   yes  Notes to clinic.  Historical medication/ historical provider.

## 2020-11-14 NOTE — Patient Instructions (Signed)
Bladder Biopsy A bladder biopsy is a procedure to remove a small sample of tissue from the bladder. The procedure is done so that the tissue can be examined under a microscope. You may have a bladder biopsy to diagnose or rule out cancer of the bladder. During a bladder biopsy, your health care provider may insert a long, thin scope with a lighted camera (cystoscope) into the urethra and move it into your bladder. The cystoscope will allow your health care provider to check the lining of the urethra and bladder and remove the tissue sample. If your health care provider also needs to check your ureters, a longer tube (ureteroscope) may be used. Tell your health care provider about: Any allergies you have. All medicines you are taking, including vitamins, herbs, eye drops, creams, and over-the-counter medicines. Any problems you or family members have had with anesthetic medicines. Any blood disorders you have. Any surgeries you have had. Any medical conditions you have. Whether you are pregnant or may be pregnant. What are the risks? Generally, this is a safe procedure. However, problems may occur, including: Bleeding. Infection, especially a urinary tract infection (UTI). Allergic reactions to medicines. Damage to nearby structures or organs, including the urethra, bladder, or ureters. Abdominal pain. Burning or pain during urination. Narrowing of the urethra due to scar tissue. Difficulty urinating due to swelling. What happens before the procedure? Medicines Ask your health care provider about: Changing or stopping your regular medicines. This is especially important if you are taking diabetes medicines or blood thinners. Taking medicines such as aspirin and ibuprofen. These medicines can thin your blood. Do not take these medicines unless your health care provider tells you to take them. Taking over-the-counter medicines, vitamins, herbs, and supplements. Surgery safety Ask your health  care provider: How your surgery site will be marked. What steps will be taken to help prevent infection. These may include: Removing hair at the surgery site. Washing skin with a germ-killing soap. Receiving antibiotic medicine. General instructions Follow instructions from your health care provider about eating and drinking restrictions. You may be asked to drink plenty of fluids. You may be asked to urinate right before the procedure. You may have a urine sample taken for UTI testing. Plan to have someone take you home from the hospital or clinic. If you will be going home right after the procedure, plan to have someone with you for 24 hours. What happens during the procedure?  An IV may be inserted into one of your veins. You may be given one or more of the following: A medicine to help you relax (sedative). A medicine to numb the opening of the urethra (local anesthetic). A medicine to make you fall asleep (general anesthetic). You will lie on your back with your knees bent and spread apart. The cystoscope or ureteroscope will be inserted into your urethra and guided into your bladder or ureters. Your bladder may be slowly filled with germ-free (sterile) water. This will make it easier for your health care provider to view the wall or lining of your bladder. Small instruments will be inserted through the scope to collect a small tissue sample that will be examined under a microscope. The procedure may vary among health care providers and hospitals. What happens after the procedure? Your blood pressure, heart rate, breathing rate, and blood oxygen level will be monitored until you leave the hospital or clinic. You may be asked to empty your bladder, or your bladder may be emptied for you. Do  not drive for 24 hours if you received a sedative. Summary A bladder biopsy is a procedure to remove a small sample of tissue from the bladder. You may have a bladder biopsy to diagnose or rule  out cancer of the bladder. Follow instructions from your health care provider about eating and drinking restrictions. Ask your health care provider if you need to stop or change any medicines that you are taking. If you will be going home right after the procedure, plan to have someone with you for 24 hours. This information is not intended to replace advice given to you by your health care provider. Make sure you discuss any questions you have with your health care provider. Document Revised: 07/06/2018 Document Reviewed: 07/06/2018 Elsevier Patient Education  2022 [place] Calmette-Guerin Live, BCG intravesical solution What is this medication? BACILLUS CALMETTE-GUERIN LIVE, BCG (ba SIL [place] KAL met gay RAYN) is a bacteria solution. This medicine stimulates the immune system to ward off cancer cells. It is used to treat bladder cancer. This medicine may be used for other purposes; ask your health care provider or pharmacist if you have questions. COMMON BRAND NAME(S): Theracys, TICE BCG What should I tell my care team before I take this medication? They need to know if you have any of these conditions: aneurysm blood in the urine bladder biopsy within 2 weeks fever or infection immune system problems leukemia lymphoma myasthenia gravis need organ transplant prosthetic device like arterial graft, artificial joint, prosthetic heart valve recent or ongoing radiation therapy tuberculosis an unusual or allergic reaction to Bacillus Calmette-Guerin Live, BCG, latex, other medicines, foods, dyes, or preservatives pregnant or trying to get pregnant breast-feeding How should I use this medication? This drug is given as a catheter infusion into the bladder. It is administered in a hospital or clinic by a specially trained health care provider. You will be given directions to follow before the treatment. Follow your health care provider's directions carefully. This medicine  contains live bacteria. It is very important to follow these directions closely after treatment to prevent others from coming in contact with your urine. Your health care provider may give you additional directions to follow. Try to hold this medicine in your bladder for 1 to 2 hours. Follow these directions the first time you go to the bathroom and for 6 hours after the first void. Wash your hands before using the restroom. After voiding, wash your hands and genital area. Use a toilet and sit when going to the bathroom. This helps to prevent the urine from splashing. Do not use public toilets or void outside. After each void, add 2 cups of undiluted bleach to the toilet. Close the lid. Wait 15 to 20 minutes and then flush the toilet. After the first void, drink more fluids to help dilute your urine. If you have urinary incontinence, wash the clothes you were wearing in the washer immediately. Do not wash other clothes at the same time. If you are wearing an incontinence pad, pour bleach on the pad and allow it to soak into the pad before throwing it away. Put the pad in a plastic bag and put it in the trash. Talk to your pediatrician regarding the use of this medicine in children. Special care may be needed. Overdosage: If you think you have taken too much of this medicine contact a poison control center or emergency room at once. NOTE: This medicine is only for you. Do not share this medicine with others.  What if I miss a dose? It is important not to miss your dose. Call your doctor or health care professional if you are unable to keep an appointment. What may interact with this medication? antibiotics medicines to suppress your immune system like chemotherapy agents or corticosteroids medicine to treat tuberculosis This list may not describe all possible interactions. Give your health care provider a list of all the medicines, herbs, non-prescription drugs, or dietary supplements you use. Also  tell them if you smoke, drink alcohol, or use illegal drugs. Some items may interact with your medicine. What should I watch for while using this medication? Visit your health care provider for checks on your progress. This medicine may make you feel generally unwell. Contact your health care provider if your symptoms last more than 2 days or if they get worse. Call your health care provider right away if you have a severe or unusual symptom. Infection can be spread to others through contact with this medicine. To prevent the spread of infection, follow your health care provider's directions carefully after treatment. Do not become pregnant while taking this medicine. There is a potential for serious side effects to an unborn child. Talk to your health care provider for more information. Do not breast-feed an infant while taking this medicine. If you have sex while on this medicine, use a condom. Ask your health care provider how long you should use a condom. What side effects may I notice from receiving this medication? Side effects that you should report to your doctor or health care professional as soon as possible: allergic reactions like skin rash, itching or hives, swelling of the face, lips, or tongue signs of infection - fever or chills, cough, sore throat, pain or difficulty passing urine signs of decreased red blood cells - unusually weak or tired, fainting spells, lightheadedness blood in urine breathing problems cough eye pain, redness flu-like symptoms joint pain bladder-area pain for more than 2 days after treatment trouble passing urine or change in the amount of urine vomiting yellowing of the eyes or skin Side effects that usually do not require medical attention (report to your doctor or health care professional if they continue or are bothersome): bladder spasm burning when passing urine within 2 days of treatment feel need to pass urine often or wake up at night to pass  urine loss of appetite This list may not describe all possible side effects. Call your doctor for medical advice about side effects. You may report side effects to FDA at 1-800-FDA-1088. Where should I keep my medication? This drug is given in a hospital or clinic and will not be stored at home. NOTE: This sheet is a summary. It may not cover all possible information. If you have questions about this medicine, talk to your doctor, pharmacist, or health care provider.  2022 Elsevier/Gold Standard (2018-03-04 12:05:29)

## 2020-11-14 NOTE — Telephone Encounter (Signed)
Per Dr. Diamantina [name]  Patient is to be scheduled for Bladder biopsy and fulgeration   Amber Galloway was contacted and possible surgical dates were discussed, 11/15/20 was agreed upon for surgery.  Patient was directed to call [phone] between 1-3pm the day before surgery to find out surgical arrival time.  Instructions were given not to eat or drink from midnight on the night before surgery and have a driver for the day of surgery. On the surgery day patient was instructed to enter through the [place] entrance of [facility] report the Same Day Surgery desk.   Reminder of this information was sent via mychart to the patient.

## 2020-11-14 NOTE — Progress Notes (Addendum)
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  Path HG Ta  After informed consent and discussion of the procedure and its risks, Amber Galloway was positioned and prepped in the standard fashion. Cystoscopy was performed with a flexible cystoscope. The urethra, bladder neck and entire bladder was visualized in a standard fashion.  On retroflexion there was a small 5 mm papillary tumor near the trigone.   Findings: Small recurrence of 5 mm papillary tumor  Assessment and Plan: I recommended bladder biopsy and fulguration, and pending pathology likely pursue induction BCG now that she falls into the high risk category based on early recurrence  [name], MD 11/14/2020

## 2020-11-15 ENCOUNTER — Encounter: Admission: RE | Payer: Self-pay | Source: Home / Self Care

## 2020-11-15 ENCOUNTER — Ambulatory Visit: Admission: RE | Admit: 2020-11-15 | Payer: Medicare HMO | Source: Home / Self Care | Admitting: Urology

## 2020-11-15 SURGERY — CYSTOSCOPY, WITH BIOPSY
Anesthesia: General

## 2020-11-25 ENCOUNTER — Encounter: Payer: Self-pay | Admitting: Family Medicine

## 2020-11-25 ENCOUNTER — Other Ambulatory Visit: Payer: Self-pay

## 2020-11-25 ENCOUNTER — Ambulatory Visit (INDEPENDENT_AMBULATORY_CARE_PROVIDER_SITE_OTHER): Payer: Medicare HMO | Admitting: Family Medicine

## 2020-11-25 VITALS — BP 130/70 | HR 86 | Ht 64.5 in | Wt 140.4 lb

## 2020-11-25 DIAGNOSIS — E782 Mixed hyperlipidemia: Secondary | ICD-10-CM

## 2020-11-25 DIAGNOSIS — Z23 Encounter for immunization: Secondary | ICD-10-CM | POA: Diagnosis not present

## 2020-11-25 DIAGNOSIS — I10 Essential (primary) hypertension: Secondary | ICD-10-CM | POA: Diagnosis not present

## 2020-11-25 DIAGNOSIS — K219 Gastro-esophageal reflux disease without esophagitis: Secondary | ICD-10-CM | POA: Diagnosis not present

## 2020-11-25 MED ORDER — GEMFIBROZIL 600 MG PO TABS: 600.0000 mg | ORAL_TABLET | Freq: Every day | ORAL | 1 refills | Status: DC

## 2020-11-25 MED ORDER — HYDROCHLOROTHIAZIDE 12.5 MG PO CAPS: 12.5000 mg | ORAL_CAPSULE | Freq: Every day | ORAL | 1 refills | Status: DC

## 2020-11-25 MED ORDER — SIMVASTATIN 80 MG PO TABS: 80.0000 mg | ORAL_TABLET | Freq: Every day | ORAL | 1 refills | Status: DC

## 2020-11-25 MED ORDER — OMEPRAZOLE 20 MG PO CPDR: DELAYED_RELEASE_CAPSULE | ORAL | 1 refills | Status: DC

## 2020-11-25 MED ORDER — ALPRAZOLAM 0.25 MG PO TABS: 0.2500 mg | ORAL_TABLET | Freq: Every day | ORAL | 0 refills | Status: DC | PRN

## 2020-11-25 MED ORDER — METOPROLOL TARTRATE 50 MG PO TABS: 50.0000 mg | ORAL_TABLET | Freq: Two times a day (BID) | ORAL | 1 refills | Status: DC

## 2020-11-25 NOTE — Progress Notes (Addendum)
Date:  11/25/2020   Name:  Amber Galloway   DOB:  Mar 07, 1940   MRN:  [id number]   Chief Complaint: Medication Refill and Follow-up  Medication Refill This is a chronic problem. The current episode started more than 1 year ago. The problem has been gradually improving. Pertinent negatives include no abdominal pain, chest pain, headaches, myalgias or neck pain.  Hypertension This is a chronic problem. The current episode started more than 1 year ago. The problem has been gradually improving since onset. The problem is controlled. Associated symptoms include anxiety. Pertinent negatives include no blurred vision, chest pain, headaches, malaise/fatigue, neck pain, orthopnea, palpitations, peripheral edema, PND, shortness of breath or sweats. There are no associated agents to hypertension. Risk factors for coronary artery disease include dyslipidemia and post-menopausal state. Past treatments include diuretics and beta blockers. The current treatment provides mild improvement. There are no compliance problems.  There is no history of angina, kidney disease, CAD/MI, CVA, heart failure, left ventricular hypertrophy, PVD or retinopathy. There is no history of chronic renal disease, a hypertension causing med or renovascular disease.  Hyperlipidemia This is a chronic problem. The current episode started more than 1 year ago. The problem is controlled. Recent lipid tests were reviewed and are normal. She has no history of chronic renal disease, diabetes, hypothyroidism, liver disease, obesity or nephrotic syndrome. Factors aggravating her hyperlipidemia include thiazides. Pertinent negatives include no chest pain, focal sensory loss, focal weakness, myalgias or shortness of breath. Current antihyperlipidemic treatment includes statins. The current treatment provides moderate improvement of lipids. There are no compliance problems.  Risk factors for coronary artery disease include dyslipidemia, post-menopausal  and hypertension.  Gastroesophageal Reflux She reports no abdominal pain, no chest pain, no dysphagia, no hoarse voice or no wheezing. This is a recurrent problem. She has tried a PPI for the symptoms. The treatment provided moderate relief.  Anxiety Presents for follow-up visit. Symptoms include excessive worry and nervous/anxious behavior. Patient reports no chest pain, palpitations or shortness of breath. Symptoms occur occasionally.     Lab Results  Component Value Date   CREATININE 0.82 05/23/2020   BUN 12 05/23/2020   NA 139 05/23/2020   K 3.6 07/30/2020   CL 97 05/23/2020   CO2 26 05/23/2020   Lab Results  Component Value Date   CHOL 188 05/23/2020   HDL 57 05/23/2020   LDLCALC 115 (H) 05/23/2020   TRIG 87 05/23/2020   CHOLHDL 3.7 03/01/2017   No results found for: TSH Lab Results  Component Value Date   HGBA1C 6.8 (H) 05/23/2020   Lab Results  Component Value Date   WBC 7.2 07/30/2020   HGB 12.9 07/30/2020   HCT 38.1 07/30/2020   MCV 89.2 07/30/2020   PLT 182 07/30/2020   Lab Results  Component Value Date   ALT 12 10/31/2020   AST 27 10/31/2020   ALKPHOS 92 10/31/2020   BILITOT 0.5 10/31/2020     Review of Systems  Constitutional:  Negative for malaise/fatigue.  HENT:  Negative for hoarse voice.   Eyes:  Negative for blurred vision.  Respiratory:  Negative for shortness of breath and wheezing.   Cardiovascular:  Negative for chest pain, palpitations, orthopnea and PND.  Gastrointestinal:  Negative for abdominal pain and dysphagia.  Musculoskeletal:  Negative for myalgias and neck pain.  Neurological:  Negative for focal weakness and headaches.  Psychiatric/Behavioral:  The patient is nervous/anxious.    Patient Active Problem List   Diagnosis Date Noted  Primary osteoarthritis of left knee 06/20/2020   Pain and swelling of left lower leg 06/20/2020   Pelvic pain 10/25/2019   Acute anxiety 08/10/2017   Overweight (BMI 25.0-29.9) 03/01/2017    Dizziness 06/05/2016   Essential hypertension 06/06/2015   Mixed hyperlipidemia 06/06/2015   Gastroesophageal reflux disease without esophagitis 06/06/2015   History of melanoma in situ 05/21/2014    No Known Allergies  Past Surgical History:  Procedure Laterality Date   APPENDECTOMY     CHOLECYSTECTOMY     COLONOSCOPY  2014   cleared   COLONOSCOPY WITH PROPOFOL N/A 07/03/2019   Procedure: COLONOSCOPY WITH PROPOFOL;  Surgeon: Toledo, Benay Pike, MD;  Location: [facility];  Service: Gastroenterology;  Laterality: N/A;   MASTECTOMY Right 08/2012   TRANSURETHRAL RESECTION OF BLADDER TUMOR WITH MITOMYCIN-C N/A 08/02/2020   Procedure: TRANSURETHRAL RESECTION OF BLADDER TUMOR WITH GEMCITABINE;  Surgeon: [name], MD;  Location: [facility] ORS;  Service: Urology;  Laterality: N/A;   VAGINAL HYSTERECTOMY      Social History   Tobacco Use   Smoking status: Never   Smokeless tobacco: Never  Vaping Use   Vaping Use: Never used  Substance Use Topics   Alcohol use: Not Currently   Drug use: Never     Medication list has been reviewed and updated.  Current Meds  Medication Sig   acetaminophen (TYLENOL) 325 MG tablet Take 325-650 mg by mouth every 6 (six) hours as needed (pain.).   ALPRAZolam (XANAX) 0.25 MG tablet Take 0.25 mg by mouth daily as needed for anxiety.   aspirin EC 81 MG tablet Take 81 mg by mouth in the morning.   Biotin 10000 MCG TABS Take 10,000 mcg by mouth in the morning.   BIOTIN PO Take 1 tablet by mouth daily.   cetirizine (ZYRTEC) 10 MG tablet Take 10 mg by mouth daily as needed for allergies.   gemfibrozil (LOPID) 600 MG tablet TAKE 1 TABLET BY MOUTH EVERY DAY (Patient taking differently: Take 600 mg by mouth at bedtime.)   hydrochlorothiazide (MICROZIDE) 12.5 MG capsule Take 12.5 mg by mouth daily.   latanoprost (XALATAN) 0.005 % ophthalmic solution Place 1 drop into both eyes daily.   meclizine (ANTIVERT) 25 MG tablet Take 1 tablet (25 mg total) by mouth 3  (three) times daily as needed for dizziness.   metoprolol tartrate (LOPRESSOR) 50 MG tablet Take 1 tablet (50 mg total) by mouth 2 (two) times daily.   Multiple Vitamin (MULTIVITAMIN WITH MINERALS) TABS tablet Take 1 tablet by mouth daily.   Omega-3 Fatty Acids (FISH OIL) 1000 MG CAPS Take 1,000 mg by mouth in the morning.   omeprazole (PRILOSEC) 20 MG capsule TAKE 1 CAPSULE BY MOUTH EVERY DAY   simvastatin (ZOCOR) 80 MG tablet Take 1 tablet (80 mg total) by mouth daily. (Patient taking differently: Take 80 mg by mouth at bedtime.)    PHQ 2/9 Scores 11/25/2020 10/30/2020 07/04/2020 06/20/2020  PHQ - 2 Score 0 2 0 0  PHQ- 9 Score 3 4 0 0    GAD 7 : Generalized Anxiety Score 11/25/2020 07/04/2020 06/20/2020 05/23/2020  Nervous, Anxious, on Edge 0 0 0 0  Control/stop worrying 0 0 0 0  Worry too much - different things 0 0 0 0  Trouble relaxing 0 0 0 0  Restless 0 0 0 0  Easily annoyed or irritable 0 0 0 0  Afraid - awful might happen 0 0 0 0  Total GAD 7 Score 0 0 0  0  Anxiety Difficulty - Not difficult at all Not difficult at all -    BP Readings from Last 3 Encounters:  11/25/20 130/70  11/14/20 (!) 150/65  10/31/20 124/74    Physical Exam Vitals and nursing note reviewed.  Constitutional:      Appearance: She is well-developed.  HENT:     Head: Normocephalic.     Right Ear: Tympanic membrane and external ear normal. There is no impacted cerumen.     Left Ear: Tympanic membrane and external ear normal. There is no impacted cerumen.     Nose: Nose normal.  Eyes:     General: Lids are everted, no foreign bodies appreciated. No scleral icterus.       Left eye: No foreign body or hordeolum.     Conjunctiva/sclera: Conjunctivae normal.     Right eye: Right conjunctiva is not injected.     Left eye: Left conjunctiva is not injected.     Pupils: Pupils are equal, round, and reactive to light.  Neck:     Thyroid: No thyromegaly.     Vascular: No JVD.     Trachea: No tracheal  deviation.  Cardiovascular:     Rate and Rhythm: Normal rate and regular rhythm.     Heart sounds: Normal heart sounds. No murmur heard.   No friction rub. No gallop.  Pulmonary:     Effort: Pulmonary effort is normal. No respiratory distress.     Breath sounds: Normal breath sounds. No wheezing, rhonchi or rales.  Abdominal:     General: Bowel sounds are normal.     Palpations: Abdomen is soft. There is no mass.     Tenderness: There is no abdominal tenderness. There is no guarding or rebound.  Musculoskeletal:        General: No tenderness. Normal range of motion.     Cervical back: Normal range of motion and neck supple.  Lymphadenopathy:     Cervical: No cervical adenopathy.  Skin:    General: Skin is warm.     Findings: No rash.  Neurological:     Mental Status: She is alert and oriented to person, place, and time.     Cranial Nerves: No cranial nerve deficit.     Deep Tendon Reflexes: Reflexes normal.  Psychiatric:        Mood and Affect: Mood is not anxious or depressed.    Wt Readings from Last 3 Encounters:  11/25/20 140 lb 6.4 oz (63.7 kg)  11/14/20 143 lb (64.9 kg)  10/31/20 143 lb (64.9 kg)    BP 130/70   Pulse 86   Ht 5' 4.5" (1.638 m)   Wt 140 lb 6.4 oz (63.7 kg)   SpO2 98%   BMI 23.73 kg/m   Assessment and Plan:   1. Essential hypertension Chronic.  Controlled.  Stable.  Blood pressure 130/70.  Continue metoprolol 50 mg 1 twice a day.  We will recheck in 6 months.  Reviewed renal function panel and it is acceptable at this time - metoprolol tartrate (LOPRESSOR) 50 MG tablet; Take 1 tablet (50 mg total) by mouth 2 (two) times daily.  Dispense: 180 tablet; Refill: 1  2. Gastroesophageal reflux disease without esophagitis Chronic.  Controlled.  Stable.  Continue omeprazole 20 mg once a day. - omeprazole (PRILOSEC) 20 MG capsule; TAKE 1 CAPSULE BY MOUTH EVERY DAY  Dispense: 90 capsule; Refill: 1  3. Mixed hyperlipidemia Chronic.  Controlled.  Stable.   Continue simvastatin 80 mg nightly  and gemfibrozil 600 mg 1 tablet at bedtime as well.  Review of lipids is acceptable at this time we will repeat in 6 months - simvastatin (ZOCOR) 80 MG tablet; Take 1 tablet (80 mg total) by mouth at bedtime.  Dispense: 90 tablet; Refill: 1 - gemfibrozil (LOPID) 600 MG tablet; Take 1 tablet (600 mg total) by mouth at bedtime.  Dispense: 90 tablet; Refill: 1  4. Need for immunization against influenza Discussed and administered - Flu Vaccine QUAD High Dose(Fluad)

## 2020-11-26 ENCOUNTER — Telehealth: Payer: Medicare HMO | Admitting: Urology

## 2020-11-27 ENCOUNTER — Other Ambulatory Visit: Payer: Self-pay

## 2020-11-27 ENCOUNTER — Other Ambulatory Visit
Admission: RE | Admit: 2020-11-27 | Discharge: 2020-11-27 | Disposition: A | Discharge status: Home / Self Care | Payer: Medicare HMO | Source: Ambulatory Visit | Attending: Urology | Admitting: Urology

## 2020-11-27 HISTORY — DX: Unspecified osteoarthritis, unspecified site: M19.90

## 2020-11-27 HISTORY — DX: Anemia, unspecified: D64.9

## 2020-11-27 NOTE — Patient Instructions (Addendum)
Your procedure is scheduled on: 11/29/20  Report to the Registration Desk on the 1st floor of the [facility]. To find out your arrival time, please call [phone] between 1PM - 3PM on: 11/28/20  Report to [facility] on 11/28/20 at 10:30 am.  REMEMBER: Instructions that are not followed completely may result in serious medical risk, up to and including death; or upon the discretion of your surgeon and anesthesiologist your surgery may need to be rescheduled.  Do not eat food or drink any fluids after midnight the night before surgery.  No gum chewing, lozengers or hard candies.  TAKE THESE MEDICATIONS THE MORNING OF SURGERY WITH A SIP OF WATER:  - metoprolol tartrate (LOPRESSOR) 50 MG tablet - omeprazole (PRILOSEC) 20 MG capsule, (take one the night before and one on the morning of surgery - helps to prevent nausea after surgery.)  Follow recommendations from Cardiologist, Pulmonologist or PCP regarding stopping Aspirin, Coumadin, Plavix, Eliquis, Pradaxa, or Pletal.  One week prior to surgery: Stop Anti-inflammatories (NSAIDS) such as Advil, Aleve, Ibuprofen, Motrin, Naproxen, Naprosyn and Aspirin based products such as Excedrin, Goodys Powder, BC Powder.  Stop ANY OVER THE COUNTER supplements until after surgery.  You may take Tylenol if needed for pain up until the day of surgery.  No Alcohol for 24 hours before or after surgery.  No Smoking including e-cigarettes for 24 hours prior to surgery.  No chewable tobacco products for at least 6 hours prior to surgery.  No nicotine patches on the day of surgery.  Do not use any "recreational" drugs for at least a week prior to your surgery.  Please be advised that the combination of cocaine and anesthesia may have negative outcomes, up to and including death. If you test positive for cocaine, your surgery will be cancelled.  On the morning of surgery brush your teeth with toothpaste and water, you may rinse your mouth  with mouthwash if you wish. Do not swallow any toothpaste or mouthwash.  Take a fresh shower/bath the morning of your procedure, you may apply deodorant.  Do not wear jewelry, make-up, hairpins, clips or nail polish.  Do not wear lotions, powders, or perfumes.   Do not shave body from the neck down 48 hours prior to surgery just in case you cut yourself which could leave a site for infection.  Also, freshly shaved skin may become irritated if using the CHG soap.  Contact lenses, hearing aids and dentures may not be worn into surgery.  Do not bring valuables to the hospital. [facility] is not responsible for any missing/lost belongings or valuables.   Notify your doctor if there is any change in your medical condition (cold, fever, infection).  Wear comfortable clothing (specific to your surgery type) to the hospital.  After surgery, you can help prevent lung complications by doing breathing exercises.  Take deep breaths and cough every 1-2 hours. Your doctor may order a device called an Incentive Spirometer to help you take deep breaths. When coughing or sneezing, hold a pillow firmly against your incision with both hands. This is called "splinting." Doing this helps protect your incision. It also decreases belly discomfort.  If you are being admitted to the hospital overnight, leave your suitcase in the car. After surgery it may be brought to your room.  If you are being discharged the day of surgery, you will not be allowed to drive home. You will need a responsible adult (18 years or older) to drive you  home and stay with you that night.   If you are taking public transportation, you will need to have a responsible adult (18 years or older) with you. Please confirm with your physician that it is acceptable to use public transportation.   Please call the [facility] at [phone] if you have any questions about these instructions.  Surgery Visitation  Policy:  Patients undergoing a surgery or procedure may have one family member or support person with them as long as that person is not COVID-19 positive or experiencing its symptoms.  That person may remain in the waiting area during the procedure and may rotate out with other people.  Inpatient Visitation:    Visiting hours are 7 a.m. to 8 p.m. Up to two visitors ages 16+ are allowed at one time in a patient room. The visitors may rotate out with other people during the day. Visitors must check out when they leave, or other visitors will not be allowed. One designated support person may remain overnight. The visitor must pass COVID-19 screenings, use hand sanitizer when entering and exiting the patient's room and wear a mask at all times, including in the patient's room. Patients must also wear a mask when staff or their visitor are in the room. Masking is required regardless of vaccination status.

## 2020-11-28 ENCOUNTER — Other Ambulatory Visit
Admission: RE | Admit: 2020-11-28 | Discharge: 2020-11-28 | Disposition: A | Discharge status: Home / Self Care | Payer: Medicare HMO | Source: Ambulatory Visit | Attending: Urology | Admitting: Urology

## 2020-11-28 ENCOUNTER — Encounter: Payer: Self-pay | Admitting: Urgent Care

## 2020-11-28 DIAGNOSIS — Z01812 Encounter for preprocedural laboratory examination: Secondary | ICD-10-CM | POA: Diagnosis present

## 2020-11-28 DIAGNOSIS — I10 Essential (primary) hypertension: Secondary | ICD-10-CM | POA: Diagnosis not present

## 2020-11-28 LAB — CBC
HCT: 38.6 % (ref 36.0–46.0)
Hemoglobin: 13.2 g/dL (ref 12.0–15.0)
MCH: 29.8 pg (ref 26.0–34.0)
MCHC: 34.2 g/dL (ref 30.0–36.0)
MCV: 87.1 fL (ref 80.0–100.0)
Platelets: 268 10*3/uL (ref 150–400)
RBC: 4.43 MIL/uL (ref 3.87–5.11)
RDW: 13.1 % (ref 11.5–15.5)
WBC: 8 10*3/uL (ref 4.0–10.5)
nRBC: 0 % (ref 0.0–0.2)

## 2020-11-28 LAB — BASIC METABOLIC PANEL
Anion gap: 10 (ref 5–15)
BUN: 26 mg/dL — ABNORMAL HIGH (ref 8–23)
CO2: 28 mmol/L (ref 22–32)
Calcium: 10 mg/dL (ref 8.9–10.3)
Chloride: 96 mmol/L — ABNORMAL LOW (ref 98–111)
Creatinine, Ser: 0.77 mg/dL (ref 0.44–1.00)
GFR, Estimated: >60 mL/min (ref >60)
Glucose, Bld: 145 mg/dL — ABNORMAL HIGH (ref 70–99)
Potassium: 3.9 mmol/L (ref 3.5–5.1)
Sodium: 134 mmol/L — ABNORMAL LOW (ref 135–145)

## 2020-11-29 ENCOUNTER — Ambulatory Visit
Admission: RE | Admit: 2020-11-29 | Discharge: 2020-11-29 | Disposition: A | Discharge status: Home / Self Care | Payer: Medicare HMO | Attending: Urology | Admitting: Urology

## 2020-11-29 ENCOUNTER — Other Ambulatory Visit: Payer: Self-pay

## 2020-11-29 ENCOUNTER — Ambulatory Visit: Payer: Medicare HMO | Admitting: Urgent Care

## 2020-11-29 ENCOUNTER — Encounter
Admission: RE | Disposition: A | Discharge status: Home / Self Care | Payer: Self-pay | Source: Home / Self Care | Attending: Urology

## 2020-11-29 ENCOUNTER — Encounter: Payer: Self-pay | Admitting: Urology

## 2020-11-29 DIAGNOSIS — C679 Malignant neoplasm of bladder, unspecified: Secondary | ICD-10-CM | POA: Insufficient documentation

## 2020-11-29 DIAGNOSIS — R69 Illness, unspecified: Secondary | ICD-10-CM | POA: Diagnosis not present

## 2020-11-29 DIAGNOSIS — D494 Neoplasm of unspecified behavior of bladder: Secondary | ICD-10-CM | POA: Diagnosis not present

## 2020-11-29 DIAGNOSIS — I10 Essential (primary) hypertension: Secondary | ICD-10-CM

## 2020-11-29 DIAGNOSIS — C678 Malignant neoplasm of overlapping sites of bladder: Secondary | ICD-10-CM

## 2020-11-29 DIAGNOSIS — Z01812 Encounter for preprocedural laboratory examination: Secondary | ICD-10-CM

## 2020-11-29 DIAGNOSIS — C671 Malignant neoplasm of dome of bladder: Secondary | ICD-10-CM

## 2020-11-29 HISTORY — PX: CYSTOSCOPY WITH BIOPSY: SHX5122

## 2020-11-29 LAB — GLUCOSE, CAPILLARY: Glucose-Capillary: 152 mg/dL — ABNORMAL HIGH (ref 70–99)

## 2020-11-29 SURGERY — CYSTOSCOPY, WITH BIOPSY
Anesthesia: General

## 2020-11-29 MED ORDER — DEXAMETHASONE SODIUM PHOSPHATE 10 MG/ML IJ SOLN
INTRAMUSCULAR | Status: DC | PRN
  Administered 2020-11-29: 5 mg via INTRAVENOUS

## 2020-11-29 MED ORDER — CEFAZOLIN SODIUM-DEXTROSE 2-4 GM/100ML-% IV SOLN
2.0000 g | INTRAVENOUS | Status: AC
  Administered 2020-11-29: 2 g via INTRAVENOUS

## 2020-11-29 MED ORDER — LIDOCAINE HCL (CARDIAC) PF 100 MG/5ML IV SOSY
PREFILLED_SYRINGE | INTRAVENOUS | Status: DC | PRN
  Administered 2020-11-29: 60 mg via INTRAVENOUS

## 2020-11-29 MED ORDER — ONDANSETRON HCL 4 MG/2ML IJ SOLN
INTRAMUSCULAR | Status: DC | PRN
  Administered 2020-11-29: 4 mg via INTRAVENOUS

## 2020-11-29 MED ORDER — ORAL CARE MOUTH RINSE: 15.0000 mL | Freq: Once | OROMUCOSAL | Status: AC

## 2020-11-29 MED ORDER — CHLORHEXIDINE GLUCONATE 0.12 % MT SOLN
OROMUCOSAL | Status: AC
  Administered 2020-11-29: 15 mL via OROMUCOSAL
  Filled 2020-11-29: qty 15

## 2020-11-29 MED ORDER — FENTANYL CITRATE (PF) 100 MCG/2ML IJ SOLN
INTRAMUSCULAR | Status: AC
  Filled 2020-11-29: qty 2

## 2020-11-29 MED ORDER — ONDANSETRON HCL 4 MG/2ML IJ SOLN: 4.0000 mg | Freq: Once | INTRAMUSCULAR | Status: DC | PRN

## 2020-11-29 MED ORDER — SEVOFLURANE IN SOLN
RESPIRATORY_TRACT | Status: AC
  Filled 2020-11-29: qty 250

## 2020-11-29 MED ORDER — PROPOFOL 10 MG/ML IV BOLUS
INTRAVENOUS | Status: DC | PRN
  Administered 2020-11-29: 110 mg via INTRAVENOUS

## 2020-11-29 MED ORDER — APREPITANT 40 MG PO CAPS: 40.0000 mg | ORAL_CAPSULE | Freq: Once | ORAL | Status: AC

## 2020-11-29 MED ORDER — APREPITANT 40 MG PO CAPS
ORAL_CAPSULE | ORAL | Status: AC
  Administered 2020-11-29: 40 mg via ORAL
  Filled 2020-11-29: qty 1

## 2020-11-29 MED ORDER — FENTANYL CITRATE (PF) 100 MCG/2ML IJ SOLN
INTRAMUSCULAR | Status: DC | PRN
  Administered 2020-11-29: 25 ug via INTRAVENOUS

## 2020-11-29 MED ORDER — CHLORHEXIDINE GLUCONATE 0.12 % MT SOLN: 15.0000 mL | Freq: Once | OROMUCOSAL | Status: AC

## 2020-11-29 MED ORDER — CEFAZOLIN SODIUM-DEXTROSE 2-4 GM/100ML-% IV SOLN
INTRAVENOUS | Status: AC
  Filled 2020-11-29: qty 100

## 2020-11-29 MED ORDER — FENTANYL CITRATE (PF) 100 MCG/2ML IJ SOLN: 25.0000 ug | INTRAMUSCULAR | Status: DC | PRN

## 2020-11-29 MED ORDER — GEMCITABINE CHEMO FOR BLADDER INSTILLATION 2000 MG
2000.0000 mg | Freq: Once | INTRAVENOUS | Status: DC
  Filled 2020-11-29: qty 52.6

## 2020-11-29 MED ORDER — PROPOFOL 10 MG/ML IV BOLUS
INTRAVENOUS | Status: AC
  Filled 2020-11-29: qty 20

## 2020-11-29 MED ORDER — LACTATED RINGERS IV SOLN: INTRAVENOUS | Status: DC

## 2020-11-29 SURGICAL SUPPLY — 27 items
BAG DRAIN CYSTO-URO LG1000N (MISCELLANEOUS) ×2 IMPLANT
BAG DRN RND TRDRP ANRFLXCHMBR (UROLOGICAL SUPPLIES) ×1
BAG URINE DRAIN 2000ML AR STRL (UROLOGICAL SUPPLIES) ×2 IMPLANT
BRUSH SCRUB EZ  4% CHG (MISCELLANEOUS) ×1
BRUSH SCRUB EZ 4% CHG (MISCELLANEOUS) ×1 IMPLANT
CATH FOL 2WAY LX 18X30 (CATHETERS) ×2 IMPLANT
DRSG TELFA 4X3 1S NADH ST (GAUZE/BANDAGES/DRESSINGS) ×2 IMPLANT
ELECT REM PT RETURN 9FT ADLT (ELECTROSURGICAL) ×2
ELECTRODE REM PT RTRN 9FT ADLT (ELECTROSURGICAL) ×1 IMPLANT
GAUZE 4X4 16PLY ~~LOC~~+RFID DBL (SPONGE) ×2 IMPLANT
GLOVE SURG UNDER POLY LF SZ7.5 (GLOVE) ×2 IMPLANT
GOWN STRL REUS W/ TWL LRG LVL3 (GOWN DISPOSABLE) ×1 IMPLANT
GOWN STRL REUS W/ TWL XL LVL3 (GOWN DISPOSABLE) ×1 IMPLANT
GOWN STRL REUS W/TWL LRG LVL3 (GOWN DISPOSABLE) ×2
GOWN STRL REUS W/TWL XL LVL3 (GOWN DISPOSABLE) ×2
GUIDEWIRE STR DUAL SENSOR (WIRE) IMPLANT
IV NS IRRIG 3000ML ARTHROMATIC (IV SOLUTION) IMPLANT
KIT TURNOVER CYSTO (KITS) ×2 IMPLANT
MANIFOLD NEPTUNE II (INSTRUMENTS) IMPLANT
PACK CYSTO AR (MISCELLANEOUS) ×2 IMPLANT
SET CYSTO W/LG BORE CLAMP LF (SET/KITS/TRAYS/PACK) ×2 IMPLANT
SURGILUBE 2OZ TUBE FLIPTOP (MISCELLANEOUS) ×2 IMPLANT
SYR TOOMEY IRRIG 70ML (MISCELLANEOUS) ×2
SYRINGE TOOMEY IRRIG 70ML (MISCELLANEOUS) ×1 IMPLANT
WATER STERILE IRR 1000ML POUR (IV SOLUTION) IMPLANT
WATER STERILE IRR 3000ML UROMA (IV SOLUTION) ×2 IMPLANT
WATER STERILE IRR 500ML POUR (IV SOLUTION) ×2 IMPLANT

## 2020-11-29 NOTE — Anesthesia Procedure Notes (Signed)
Procedure Name: LMA Insertion Date/Time: 11/29/2020 2:47 PM Performed by: Aline Brochure, CRNA Pre-anesthesia Checklist: Patient identified, Patient being monitored, Timeout performed, Emergency Drugs available and Suction available Patient Re-evaluated:Patient Re-evaluated prior to induction Oxygen Delivery Method: Circle system utilized Preoxygenation: Pre-oxygenation with 100% oxygen Induction Type: IV induction Ventilation: Mask ventilation without difficulty LMA: LMA inserted LMA Size: 3.5 Tube type: Oral Number of attempts: 1 Placement Confirmation: positive ETCO2 and breath sounds checked- equal and bilateral Tube secured with: Tape Dental Injury: Teeth and Oropharynx as per pre-operative assessment

## 2020-11-29 NOTE — H&P (Signed)
   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  Likely will pursue BCG in the future.    PMH: Past Medical History:  Diagnosis Date   Anemia    Anxiety    Arthritis    Bladder tumor    Breast cancer ([place]) 08/2012   rt mastectomy   GERD (gastroesophageal reflux disease)    Headache    h/o migraines   Hyperlipidemia    Hypertension    Melanoma ([place])    Myocardial infarction ([place]) 1997   no stents   PONV (postoperative nausea and vomiting)    Pre-diabetes     Surgical History: Past Surgical History:  Procedure Laterality Date   APPENDECTOMY     CHOLECYSTECTOMY     COLONOSCOPY  2014   cleared   COLONOSCOPY WITH PROPOFOL N/A 07/03/2019   Procedure: COLONOSCOPY WITH PROPOFOL;  Surgeon: Toledo, Benay Pike, MD;  Location: [facility] END[facility];  Service: Gastroenterology;  Laterality: N/A;   MASTECTOMY Right 08/2012   TRANSURETHRAL RESECTION OF BLADDER TUMOR WITH MITOMYCIN-C N/A 08/02/2020   Procedure: TRANSURETHRAL RESECTION OF BLADDER TUMOR WITH GEMCITABINE;  Surgeon: [name], MD;  Location: [facility] ORS;  Service: Urology;  Laterality: N/A;   VAGINAL HYSTERECTOMY       Family History: Family History  Problem Relation Age of Onset   Breast cancer Sister 37   Brain cancer Daughter    Breast cancer Maternal Aunt 80    Social History:  reports that she has never smoked. She has never used smokeless tobacco. She reports that she does not currently use alcohol. She reports that she does not use drugs.  Physical Exam: BP 133/63   Pulse 95   Temp 98 F (36.7 C) (Temporal)   Resp 18   Ht 5\' 4"  (1.626 m)   Wt 60.8 kg   SpO2 99%   BMI 23.00 kg/m    Constitutional:  Alert and oriented, No acute distress. 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  We discussed transurethral resection of bladder tumor (TURBT) and risks and benefits at length. This is typically a 1 to 2-hour procedure done under general anesthesia in the operating room.  A scope is inserted through the urethra and used to resect abnormal tissue within the bladder, which is then sent to the pathologist to determine grade and stage of the tumor.  Risks include bleeding, infection, need for temporary Foley placement, and bladder perforation.  Treatment strategies are based on the type of tumor and depth of invasion.  We briefly reviewed the different treatment pathways for non-muscle invasive and muscle invasive bladder cancer.  Bladder biopsy and fulguration today  [name], MD 11/29/2020  [facility] [address], [place], [place] [address] [phone]

## 2020-11-29 NOTE — Discharge Instructions (Signed)
AMBULATORY SURGERY  ?DISCHARGE INSTRUCTIONS ? ? ?The drugs that you were given will stay in your system until tomorrow so for the next 24 hours you should not: ? ?Drive an automobile ?Make any legal decisions ?Drink any alcoholic beverage ? ? ?You may resume regular meals tomorrow.  Today it is better to start with liquids and gradually work up to solid foods. ? ?You may eat anything you prefer, but it is better to start with liquids, then soup and crackers, and gradually work up to solid foods. ? ? ?Please notify your doctor immediately if you have any unusual bleeding, trouble breathing, redness and pain at the surgery site, drainage, fever, or pain not relieved by medication. ? ? ? ?Additional Instructions: ? ? ? ?Please contact your physician with any problems or Same Day Surgery at [phone], Monday through Friday 6 am to 4 pm, or [facility] [address] Main number at [phone].  ?

## 2020-11-29 NOTE — Transfer of Care (Signed)
Immediate Anesthesia Transfer of Care Note  Patient: Amber Galloway  Procedure(s) Performed: CYSTOSCOPY WITH BLADDER BIOPSY  Patient Location: PACU  Anesthesia Type:General  Level of Consciousness: awake  Airway & Oxygen Therapy: Patient Spontanous Breathing and Patient connected to face mask oxygen  Post-op Assessment: Report given to RN and Post -op Vital signs reviewed and stable  Post vital signs: Reviewed and stable  Last Vitals:  Vitals Value Taken Time  BP 153/63   Temp    Pulse 97   Resp 13   SpO2 95     Last Pain:  Vitals:   11/29/20 1149  TempSrc: Temporal  PainSc: 0-No pain         Complications: No notable events documented.

## 2020-11-29 NOTE — Op Note (Signed)
Date of procedure: 11/29/20  Preoperative diagnosis:  Bladder tumor  Postoperative diagnosis:  Same  Procedure: Cystoscopy, bladder biopsy and fulguration  Surgeon: [name], MD  Anesthesia: General  Complications: None  Intraoperative findings:  5 mm papillary tumor at the dome, multiple very small early papillary changes all fulgurated throughout the bladder Excellent hemostasis, ureteral orifices intact at conclusion of case  EBL: Minimal  Specimens: Bladder tumor  Drains: None  Indication: Amber Galloway is a 80 y.o. 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  After reviewing the management options for treatment, they elected to proceed with the above surgical procedure(s). We have discussed the potential benefits and risks of the procedure, side effects of the proposed treatment, the likelihood of the patient achieving the goals of the procedure, and any potential problems that might occur during the procedure or recuperation. Informed consent has been obtained.  Description of procedure:  The patient was taken to the operating room and general anesthesia was induced. SCDs were placed for DVT prophylaxis. The patient was placed in the dorsal lithotomy position, prepped and draped in the usual sterile fashion, and preoperative antibiotics were administered. A preoperative time-out was performed.   A 21 French rigid cystoscope was used to intubate the urethra and thorough cystoscopy was performed.  The ureteral orifices were orthotopic lobe bilaterally.  There was a 5 mm papillary tumor at the dome, and 4 additional very small 2 mm areas of early papillary change scattered throughout the bladder.  The larger papillary tumor at the dome was completely removed with cold cup biopsy forceps, and all sites were fulgurated.  Thorough cystoscopy showed no evidence of any remaining papillary  tumor in the bladder, there was excellent hemostasis.  The bladder was irrigated copiously with water.  The bladder was drained and this concluded our procedure  Disposition: Stable to PACU  Plan: Follow-up in 1 to 2 weeks to discuss pathology results, likely pursue BCG with early recurrence of intermediate risk disease  [name], MD

## 2020-11-29 NOTE — Anesthesia Preprocedure Evaluation (Addendum)
Anesthesia Evaluation  Patient identified by MRN, date of birth, ID band Patient awake    Reviewed: Allergy & Precautions, NPO status , Patient's Chart, lab work & pertinent test results, reviewed documented beta blocker date and time   History of Anesthesia Complications (+) PONVNegative for: history of anesthetic complications  Airway Mallampati: II  TM Distance: <3 FB Neck ROM: Full    Dental no notable dental hx. (+) Edentulous Upper, Poor Dentition   Pulmonary neg pulmonary ROS,    Pulmonary exam normal breath sounds clear to auscultation       Cardiovascular Exercise Tolerance: Good METS: 3 - Mets hypertension, Pt. on medications and Pt. on home beta blockers + Past MI (Remote Asx-  able to do 4 METS)  Normal cardiovascular exam Rhythm:Regular Rate:Normal     Neuro/Psych  Headaches, Anxiety negative psych ROS   GI/Hepatic negative GI ROS, Neg liver ROS, neg GERD  ,  Endo/Other  negative endocrine ROS  Renal/GU negative Renal ROS  negative genitourinary   Musculoskeletal  (+) Arthritis ,   Abdominal   Peds  Hematology negative hematology ROS (+) anemia ,   Anesthesia Other Findings Anxiety    Bladder tumor    Breast cancer ([place]) 08/2012 rt mastectomy  GERD (gastroesophageal reflux disease) Headache  h/o migraines  Hyperlipidemia    Hypertension    Melanoma ([place])    Myocardial infarction ([place]) 1997 no stents  PONV (postoperative nausea and vomiting) Pre-diabetes   Facial Bruising from recent teeth extractions      Reproductive/Obstetrics negative OB ROS                             Anesthesia Physical  Anesthesia Plan  ASA: 3  Anesthesia Plan: General ETT and General   Post-op Pain Management:    Induction: Intravenous  PONV Risk Score and Plan: 3 and Ondansetron, Midazolam and Propofol infusion  Airway Management Planned: LMA  Additional Equipment:   Intra-op  Plan:   Post-operative Plan: Extubation in OR  Informed Consent: I have reviewed the patients History and Physical, chart, labs and discussed the procedure including the risks, benefits and alternatives for the proposed anesthesia with the patient or authorized representative who has indicated his/her understanding and acceptance.     Dental Advisory Given  Plan Discussed with: Anesthesiologist, CRNA and Surgeon  Anesthesia Plan Comments: (Patient consented for risks of anesthesia including but not limited to:  - adverse reactions to medications - damage to eyes, teeth, lips or other oral mucosa - nerve damage due to positioning  - sore throat or hoarseness - Damage to heart, brain, nerves, lungs, other parts of body or loss of life  Patient voiced understanding.)       Anesthesia Quick Evaluation

## 2020-11-30 NOTE — Anesthesia Postprocedure Evaluation (Signed)
Anesthesia Post Note  Patient: Amber Galloway  Procedure(s) Performed: CYSTOSCOPY WITH BLADDER BIOPSY  Patient location during evaluation: PACU Anesthesia Type: General Level of consciousness: awake and alert Pain management: pain level controlled Vital Signs Assessment: post-procedure vital signs reviewed and stable Respiratory status: spontaneous breathing, nonlabored ventilation, respiratory function stable and patient connected to nasal cannula oxygen Cardiovascular status: blood pressure returned to baseline and stable Postop Assessment: no apparent nausea or vomiting Anesthetic complications: no   No notable events documented.   Last Vitals:  Vitals:   11/29/20 1545 11/29/20 1602  BP: (!) 153/63 (!) 162/65  Pulse: 96   Resp: 17 16  Temp:  37 C  SpO2: 96% 95%    Last Pain:  Vitals:   11/29/20 1602  TempSrc: Temporal  PainSc: 0-No pain                 [name]

## 2020-12-01 ENCOUNTER — Encounter: Payer: Self-pay | Admitting: Urology

## 2020-12-03 LAB — SURGICAL PATHOLOGY

## 2020-12-09 ENCOUNTER — Telehealth: Payer: Medicare HMO | Admitting: Urology

## 2020-12-10 ENCOUNTER — Ambulatory Visit (INDEPENDENT_AMBULATORY_CARE_PROVIDER_SITE_OTHER): Payer: Medicare HMO | Admitting: Urology

## 2020-12-10 ENCOUNTER — Other Ambulatory Visit: Payer: Self-pay

## 2020-12-10 ENCOUNTER — Telehealth: Payer: Self-pay

## 2020-12-10 DIAGNOSIS — C678 Malignant neoplasm of overlapping sites of bladder: Secondary | ICD-10-CM

## 2020-12-10 NOTE — Telephone Encounter (Signed)
-----   Message from [name], MD sent at 12/10/2020  8:36 AM EST ----- Regarding: Induction BCG Please schedule for induction BCG to start in 3 to 4 weeks, followed by cystoscopy with me in 3 to 4 months,thank you.  She also had questions about BCG cost.  [name], MD 12/10/2020

## 2020-12-10 NOTE — Progress Notes (Signed)
Virtual Visit via Telephone Note  I connected with Amber Galloway on 12/10/20 at  8:30 AM EST by telephone and verified that I am speaking with the correct person using two identifiers.   Patient location: Home Provider location: [facility]   I discussed the limitations, risks, security and privacy concerns of performing an evaluation and management service by telephone and the availability of in person appointments. We discussed the impact of the COVID-19 pandemic on the healthcare system, and the importance of social distancing and reducing patient and provider exposure. I also discussed with the patient that there may be a patient responsible charge related to this service. The patient expressed understanding and agreed to proceed.  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  She underwent bladder biopsy and fulguration of multiple small papillary lesions on 11/29/2020, and pathology again showed HG Ta.  She has been doing well since surgery and has no urinary complaints today.  We reviewed the AUA guidelines regarding nonmuscle invasive bladder cancer, I recommended pursuing induction BCG and potentially even maintenance BCG for 2 years.  Risks and benefits discussed at length.  We discussed the 40% risk of recurrence and progression.   Follow Up: Schedule induction BCG to start in 3 to 4 weeks RTC with me 3 to 4 months surveillance cystoscopy   I discussed the assessment and treatment plan with the patient. The patient was provided an opportunity to ask questions and all were answered. The patient agreed with the plan and demonstrated an understanding of the instructions.   The patient was advised to call back or seek an in-person evaluation if the  symptoms worsen or if the condition fails to improve as anticipated.  I provided 8 minutes of non-face-to-face time during this encounter.   [name], MD

## 2020-12-13 ENCOUNTER — Telehealth: Payer: Self-pay

## 2020-12-13 ENCOUNTER — Encounter: Payer: Self-pay | Admitting: Internal Medicine

## 2020-12-13 ENCOUNTER — Other Ambulatory Visit: Payer: Self-pay

## 2020-12-13 ENCOUNTER — Ambulatory Visit (INDEPENDENT_AMBULATORY_CARE_PROVIDER_SITE_OTHER): Payer: Medicare HMO | Admitting: Internal Medicine

## 2020-12-13 VITALS — BP 136/82 | HR 83 | Temp 98.1°F | Ht 64.5 in | Wt 138.6 lb

## 2020-12-13 DIAGNOSIS — J0100 Acute maxillary sinusitis, unspecified: Secondary | ICD-10-CM

## 2020-12-13 MED ORDER — AZITHROMYCIN 250 MG PO TABS: ORAL_TABLET | ORAL | 0 refills | Status: AC

## 2020-12-13 NOTE — Patient Instructions (Addendum)
Take Cetirizine (zyrtec) 10 mg once a day  Drink lots of fluids  Delsym is a good over the counter cough syrup

## 2020-12-13 NOTE — Telephone Encounter (Signed)
Copied from [place] [id number]. Topic: General - Other >> Dec 13, 2020  9:08 AM Antonieta Iba C wrote: Reason for CRM: pt called in for assistance. Pt says that she is having possible sinuses. Pt would like to know if possible could provider see her? Pt would like to have a Rx to help clear her up. Pt would like further assistance from her provider.    Phone: [phone]   Pharmacy: [id number] - [place], [place]  Phone:  [phone] Fax:  [phone]

## 2020-12-13 NOTE — Progress Notes (Signed)
Date:  12/13/2020   Name:  Amber Galloway   DOB:  05/30/1940   MRN:  [id number]   Chief Complaint: Sinusitis  Sinusitis This is a new problem. Episode onset: 2 weeks. The problem has been gradually worsening since onset. There has been no fever. Her pain is at a severity of 0/10. She is experiencing no pain. Associated symptoms include congestion, coughing, sinus pressure and a sore throat. Pertinent negatives include no chills or headaches. Past treatments include acetaminophen. The treatment provided mild relief.   Lab Results  Component Value Date   NA 134 (L) 11/28/2020   K 3.9 11/28/2020   CO2 28 11/28/2020   GLUCOSE 145 (H) 11/28/2020   BUN 26 (H) 11/28/2020   CREATININE 0.77 11/28/2020   CALCIUM 10.0 11/28/2020   EGFR 72 05/23/2020   GFRNONAA >60 11/28/2020   Lab Results  Component Value Date   CHOL 188 05/23/2020   HDL 57 05/23/2020   LDLCALC 115 (H) 05/23/2020   TRIG 87 05/23/2020   CHOLHDL 3.7 03/01/2017   No results found for: TSH Lab Results  Component Value Date   HGBA1C 6.8 (H) 05/23/2020   Lab Results  Component Value Date   WBC 8.0 11/28/2020   HGB 13.2 11/28/2020   HCT 38.6 11/28/2020   MCV 87.1 11/28/2020   PLT 268 11/28/2020   Lab Results  Component Value Date   ALT 12 10/31/2020   AST 27 10/31/2020   ALKPHOS 92 10/31/2020   BILITOT 0.5 10/31/2020   No results found for: 25OHVITD2, 25OHVITD3, VD25OH   Review of Systems  Constitutional:  Positive for fatigue. Negative for chills, fever and unexpected weight change.  HENT:  Positive for congestion, postnasal drip, sinus pressure and sore throat. Negative for trouble swallowing.   Respiratory:  Positive for cough. Negative for chest tightness and wheezing.   Cardiovascular:  Negative for chest pain.  Neurological:  Negative for dizziness and headaches.   Patient Active Problem List   Diagnosis Date Noted   Primary osteoarthritis of left knee 06/20/2020   Pain and swelling of left lower  leg 06/20/2020   Pelvic pain 10/25/2019   Acute anxiety 08/10/2017   Overweight (BMI 25.0-29.9) 03/01/2017   Dizziness 06/05/2016   Essential hypertension 06/06/2015   Mixed hyperlipidemia 06/06/2015   Gastroesophageal reflux disease without esophagitis 06/06/2015   History of melanoma in situ 05/21/2014    No Known Allergies  Past Surgical History:  Procedure Laterality Date   APPENDECTOMY     CHOLECYSTECTOMY     COLONOSCOPY  2014   cleared   COLONOSCOPY WITH PROPOFOL N/A 07/03/2019   Procedure: COLONOSCOPY WITH PROPOFOL;  Surgeon: Toledo, Benay Pike, MD;  Location: [facility];  Service: Gastroenterology;  Laterality: N/A;   CYSTOSCOPY WITH BIOPSY N/A 11/29/2020   Procedure: CYSTOSCOPY WITH BLADDER BIOPSY;  Surgeon: [name], MD;  Location: [facility];  Service: Urology;  Laterality: N/A;   MASTECTOMY Right 08/2012   TRANSURETHRAL RESECTION OF BLADDER TUMOR WITH MITOMYCIN-C N/A 08/02/2020   Procedure: TRANSURETHRAL RESECTION OF BLADDER TUMOR WITH GEMCITABINE;  Surgeon: [name], MD;  Location: [facility];  Service: Urology;  Laterality: N/A;   VAGINAL HYSTERECTOMY      Social History   Tobacco Use   Smoking status: Never   Smokeless tobacco: Never  Vaping Use   Vaping Use: Never used  Substance Use Topics   Alcohol use: Not Currently   Drug use: Never     Medication list has  been reviewed and updated.  Current Meds  Medication Sig   acetaminophen (TYLENOL) 325 MG tablet Take 325-650 mg by mouth every 6 (six) hours as needed (pain.).   ALPRAZolam (XANAX) 0.25 MG tablet Take 1 tablet (0.25 mg total) by mouth daily as needed for anxiety.   aspirin EC 81 MG tablet Take 81 mg by mouth in the morning.   azithromycin (ZITHROMAX Z-PAK) 250 MG tablet UAD   Biotin 10000 MCG TABS Take 10,000 mcg by mouth in the morning.   BIOTIN PO Take 1 tablet by mouth daily.   cetirizine (ZYRTEC) 10 MG tablet Take 10 mg by mouth daily as needed for allergies.   gemfibrozil  (LOPID) 600 MG tablet Take 1 tablet (600 mg total) by mouth at bedtime.   hydrochlorothiazide (MICROZIDE) 12.5 MG capsule Take 1 capsule (12.5 mg total) by mouth daily.   meclizine (ANTIVERT) 25 MG tablet Take 1 tablet (25 mg total) by mouth 3 (three) times daily as needed for dizziness.   metoprolol tartrate (LOPRESSOR) 50 MG tablet Take 1 tablet (50 mg total) by mouth 2 (two) times daily.   Multiple Vitamin (MULTIVITAMIN WITH MINERALS) TABS tablet Take 1 tablet by mouth daily.   Omega-3 Fatty Acids (FISH OIL) 1000 MG CAPS Take 1,000 mg by mouth in the morning.   omeprazole (PRILOSEC) 20 MG capsule TAKE 1 CAPSULE BY MOUTH EVERY DAY   simvastatin (ZOCOR) 80 MG tablet Take 1 tablet (80 mg total) by mouth at bedtime.    PHQ 2/9 Scores 12/13/2020 11/25/2020 10/30/2020 07/04/2020  PHQ - 2 Score 0 0 2 0  PHQ- 9 Score '6 3 4 ' 0    GAD 7 : Generalized Anxiety Score 12/13/2020 11/25/2020 07/04/2020 06/20/2020  Nervous, Anxious, on Edge 0 0 0 0  Control/stop worrying 0 0 0 0  Worry too much - different things 0 0 0 0  Trouble relaxing 0 0 0 0  Restless 0 0 0 0  Easily annoyed or irritable 0 0 0 0  Afraid - awful might happen 0 0 0 0  Total GAD 7 Score 0 0 0 0  Anxiety Difficulty Not difficult at all - Not difficult at all Not difficult at all    BP Readings from Last 3 Encounters:  12/13/20 136/82  11/29/20 (!) 162/65  11/25/20 130/70    Physical Exam Constitutional:      Appearance: She is well-developed.  HENT:     Right Ear: Ear canal and external ear normal. Tympanic membrane is not erythematous or retracted.     Left Ear: Ear canal and external ear normal. Tympanic membrane is not erythematous or retracted.     Nose:     Right Sinus: Maxillary sinus tenderness present. No frontal sinus tenderness.     Left Sinus: Maxillary sinus tenderness present. No frontal sinus tenderness.     Mouth/Throat:     Mouth: No oral lesions.     Pharynx: Uvula midline. Posterior oropharyngeal erythema  present. No oropharyngeal exudate.  Neck:     Comments: Bruising noted from dental surgery Cardiovascular:     Rate and Rhythm: Normal rate and regular rhythm.     Pulses: Normal pulses.     Heart sounds: Normal heart sounds.  Pulmonary:     Effort: Pulmonary effort is normal.     Breath sounds: Normal breath sounds. No wheezing or rales.  Musculoskeletal:     Cervical back: Normal range of motion.  Lymphadenopathy:     Cervical: No cervical  adenopathy.  Neurological:     Mental Status: She is alert and oriented to person, place, and time.    Wt Readings from Last 3 Encounters:  12/13/20 138 lb 9.6 oz (62.9 kg)  11/29/20 134 lb (60.8 kg)  11/25/20 140 lb 6.4 oz (63.7 kg)    BP 136/82   Pulse 83   Temp 98.1 F (36.7 C) (Oral)   Ht 5' 4.5" (1.638 m)   Wt 138 lb 9.6 oz (62.9 kg)   SpO2 98%   BMI 23.42 kg/m   Assessment and Plan: 1. Acute non-recurrent maxillary sinusitis Resume Zyrtec 10 mg once a day Increase fluids; use Delsym if needed for cough Follow up if no improvement - azithromycin (ZITHROMAX Z-PAK) 250 MG tablet; UAD  Dispense: 6 each; Refill: 0   Partially dictated using Editor, commissioning. Any errors are unintentional.  Halina Maidens, MD [facility]  12/13/2020

## 2020-12-18 NOTE — Telephone Encounter (Signed)
Called patient and discussed detailed BCG instructions and education. Patient was told due to current back order we would wait to schedule treatments at this time. She will be due to start around 01/10/21. Patient verbalized understanding and states she would like for her treatments to be done on Mondays in the [place] location. Mailed BCG education to patient

## 2020-12-19 ENCOUNTER — Other Ambulatory Visit: Payer: Self-pay

## 2020-12-19 ENCOUNTER — Telehealth: Payer: Self-pay

## 2020-12-19 DIAGNOSIS — Z1231 Encounter for screening mammogram for malignant neoplasm of breast: Secondary | ICD-10-CM

## 2020-12-19 NOTE — Telephone Encounter (Signed)
Called with appt for mammo on 02/06/21 @ 900 instead of 2022

## 2020-12-19 NOTE — Progress Notes (Signed)
Scheduled mammo for [place] 02/07/20 @ 9:00

## 2020-12-19 NOTE — Telephone Encounter (Signed)
Copied from [place] [id number]. Topic: Appointment Scheduling - Scheduling Inquiry for Clinic >> Dec 19, 2020  9:35 AM Valere Dross wrote: Reason for CRM: Pt called in wanting to speak with tara about getting a Mammo scheduled, pt requested a call back, please advise.

## 2020-12-19 NOTE — Telephone Encounter (Signed)
Patient called in just to inform nurse that she received her message and thankyou.

## 2021-01-20 ENCOUNTER — Telehealth: Payer: Self-pay | Admitting: Urology

## 2021-01-20 NOTE — Telephone Encounter (Signed)
Pt LMOM asking about BCG treatments, says she has been waiting on a phone call back from Judson Roch to get appts to start treatments.

## 2021-01-23 NOTE — Telephone Encounter (Signed)
Spoke with patient.  She is now scheduled for 6 BCG treatments.  Patient expressed understanding.

## 2021-01-23 NOTE — Telephone Encounter (Signed)
Patient is scheduled for 6 BCG treatments.   Patient expressed understanding.

## 2021-01-27 DIAGNOSIS — C50911 Malignant neoplasm of unspecified site of right female breast: Secondary | ICD-10-CM | POA: Diagnosis not present

## 2021-01-28 ENCOUNTER — Ambulatory Visit: Payer: Medicare HMO | Admitting: Family Medicine

## 2021-01-28 ENCOUNTER — Other Ambulatory Visit: Payer: Self-pay

## 2021-01-28 DIAGNOSIS — C678 Malignant neoplasm of overlapping sites of bladder: Secondary | ICD-10-CM

## 2021-01-28 LAB — URINALYSIS, COMPLETE
Bilirubin, UA: NEGATIVE
Glucose, UA: NEGATIVE
Ketones, UA: NEGATIVE
Leukocytes,UA: NEGATIVE
Nitrite, UA: NEGATIVE
Protein,UA: NEGATIVE
RBC, UA: NEGATIVE
Specific Gravity, UA: 1.02 (ref 1.005–1.030)
Urobilinogen, Ur: 2 mg/dL — ABNORMAL HIGH (ref 0.2–1.0)
pH, UA: 6.5 (ref 5.0–7.5)

## 2021-01-28 LAB — MICROSCOPIC EXAMINATION: Bacteria, UA: NONE SEEN

## 2021-01-28 MED ORDER — BCG LIVE 50 MG IS SUSR
3.2400 mL | Freq: Once | INTRAVESICAL | Status: AC
  Administered 2021-01-28: 81 mg via INTRAVESICAL

## 2021-01-28 NOTE — Patient Instructions (Signed)

## 2021-01-28 NOTE — Progress Notes (Signed)
BCG Bladder Instillation  BCG # 1 of 6  Due to Bladder Cancer patient is present today for a BCG treatment. Patient was cleaned and prepped in a sterile fashion with betadine. A 14FR catheter was inserted, urine return was noted 74ml, urine was yellow in color.  45ml of reconstituted BCG was instilled into the bladder. The catheter was then removed. Patient tolerated well, no complications were noted  Performed by: Elberta Leatherwood, CMA, Gaspar Cola, CMA  Follow up/ Additional notes: 1 week BCG #2 of 6

## 2021-01-29 DIAGNOSIS — L821 Other seborrheic keratosis: Secondary | ICD-10-CM | POA: Diagnosis not present

## 2021-01-29 DIAGNOSIS — L570 Actinic keratosis: Secondary | ICD-10-CM | POA: Diagnosis not present

## 2021-01-30 ENCOUNTER — Telehealth: Payer: Self-pay

## 2021-01-30 NOTE — Telephone Encounter (Signed)
Called and left message to return call concerning bra request form

## 2021-01-31 NOTE — Telephone Encounter (Signed)
Pt called back to speak with Baxter Flattery, advised it was better to call her back on her cell phone.

## 2021-02-04 ENCOUNTER — Ambulatory Visit: Payer: Medicare HMO | Admitting: Family Medicine

## 2021-02-04 ENCOUNTER — Other Ambulatory Visit: Payer: Self-pay

## 2021-02-04 DIAGNOSIS — C678 Malignant neoplasm of overlapping sites of bladder: Secondary | ICD-10-CM

## 2021-02-04 LAB — URINALYSIS, COMPLETE
Bilirubin, UA: NEGATIVE
Glucose, UA: NEGATIVE
Ketones, UA: NEGATIVE
Nitrite, UA: NEGATIVE
Protein,UA: NEGATIVE
RBC, UA: NEGATIVE
Specific Gravity, UA: 1.02 (ref 1.005–1.030)
Urobilinogen, Ur: 2 mg/dL — ABNORMAL HIGH (ref 0.2–1.0)
pH, UA: 6 (ref 5.0–7.5)

## 2021-02-04 LAB — MICROSCOPIC EXAMINATION
Bacteria, UA: NONE SEEN
RBC, Urine: NONE SEEN /hpf (ref 0–2)

## 2021-02-04 MED ORDER — BCG LIVE 50 MG IS SUSR
3.2400 mL | Freq: Once | INTRAVESICAL | Status: AC
  Administered 2021-02-04: 81 mg via INTRAVESICAL

## 2021-02-04 NOTE — Progress Notes (Signed)
BCG Bladder Instillation  BCG # 2 of 6  Due to Bladder Cancer patient is present today for a BCG treatment. Patient was cleaned and prepped in a sterile fashion with betadine. A 14FR catheter was inserted, urine return was noted 44ml, urine was yellow in color.  31ml of reconstituted BCG was instilled into the bladder. The catheter was then removed. Patient tolerated well, no complications were noted  Performed by: Elberta Leatherwood, CMA, Gaspar Cola, CMA  Follow up/ Additional notes: 1 week #3

## 2021-02-04 NOTE — Patient Instructions (Signed)

## 2021-02-06 ENCOUNTER — Ambulatory Visit
Admission: RE | Admit: 2021-02-06 | Discharge: 2021-02-06 | Disposition: A | Discharge status: Home / Self Care | Payer: Medicare HMO | Source: Ambulatory Visit | Attending: Family Medicine | Admitting: Family Medicine

## 2021-02-06 ENCOUNTER — Other Ambulatory Visit: Payer: Self-pay

## 2021-02-06 DIAGNOSIS — Z1231 Encounter for screening mammogram for malignant neoplasm of breast: Secondary | ICD-10-CM | POA: Insufficient documentation

## 2021-02-06 DIAGNOSIS — C50911 Malignant neoplasm of unspecified site of right female breast: Secondary | ICD-10-CM | POA: Diagnosis not present

## 2021-02-11 ENCOUNTER — Ambulatory Visit: Payer: Medicare HMO | Admitting: *Deleted

## 2021-02-11 ENCOUNTER — Other Ambulatory Visit: Payer: Self-pay

## 2021-02-11 DIAGNOSIS — C679 Malignant neoplasm of bladder, unspecified: Secondary | ICD-10-CM

## 2021-02-11 DIAGNOSIS — D494 Neoplasm of unspecified behavior of bladder: Secondary | ICD-10-CM

## 2021-02-11 LAB — URINALYSIS, COMPLETE
Bilirubin, UA: NEGATIVE
Glucose, UA: NEGATIVE
Ketones, UA: NEGATIVE
Leukocytes,UA: NEGATIVE
Nitrite, UA: NEGATIVE
Protein,UA: NEGATIVE
RBC, UA: NEGATIVE
Specific Gravity, UA: 1.015 (ref 1.005–1.030)
Urobilinogen, Ur: >8 mg/dL — ABNORMAL HIGH (ref 0.2–1.0)
pH, UA: 7 (ref 5.0–7.5)

## 2021-02-11 LAB — MICROSCOPIC EXAMINATION: Bacteria, UA: NONE SEEN

## 2021-02-11 MED ORDER — BCG LIVE 50 MG IS SUSR
3.2400 mL | Freq: Once | INTRAVESICAL | Status: AC
  Administered 2021-02-11: 81 mg via INTRAVESICAL

## 2021-02-11 NOTE — Progress Notes (Signed)
BCG Bladder Instillation  BCG # 3  Due to Bladder Cancer patient is present today for a BCG treatment. Patient was cleaned and prepped in a sterile fashion with betadine. A 14R catheter was inserted, urine return was noted 63ml, urine was yellow in color.  49ml of reconstituted BCG was instilled into the bladder. The catheter was then removed. Patient tolerated well, no complications were noted  Performed by: Gaspar Cola CMA and Elberta Leatherwood CMA  Follow up/ Additional notes: 1 week

## 2021-02-11 NOTE — Patient Instructions (Signed)

## 2021-02-17 DIAGNOSIS — H52223 Regular astigmatism, bilateral: Secondary | ICD-10-CM | POA: Diagnosis not present

## 2021-02-17 DIAGNOSIS — H524 Presbyopia: Secondary | ICD-10-CM | POA: Diagnosis not present

## 2021-02-17 DIAGNOSIS — H40013 Open angle with borderline findings, low risk, bilateral: Secondary | ICD-10-CM | POA: Diagnosis not present

## 2021-02-17 DIAGNOSIS — H2513 Age-related nuclear cataract, bilateral: Secondary | ICD-10-CM | POA: Diagnosis not present

## 2021-02-17 DIAGNOSIS — H5203 Hypermetropia, bilateral: Secondary | ICD-10-CM | POA: Diagnosis not present

## 2021-02-18 ENCOUNTER — Other Ambulatory Visit: Payer: Self-pay

## 2021-02-18 ENCOUNTER — Ambulatory Visit: Payer: Medicare HMO | Admitting: Family Medicine

## 2021-02-18 DIAGNOSIS — C678 Malignant neoplasm of overlapping sites of bladder: Secondary | ICD-10-CM

## 2021-02-18 DIAGNOSIS — D494 Neoplasm of unspecified behavior of bladder: Secondary | ICD-10-CM

## 2021-02-18 DIAGNOSIS — C679 Malignant neoplasm of bladder, unspecified: Secondary | ICD-10-CM | POA: Diagnosis not present

## 2021-02-18 LAB — URINALYSIS, COMPLETE
Bilirubin, UA: NEGATIVE
Glucose, UA: NEGATIVE
Ketones, UA: NEGATIVE
Leukocytes,UA: NEGATIVE
Nitrite, UA: NEGATIVE
Protein,UA: NEGATIVE
RBC, UA: NEGATIVE
Specific Gravity, UA: 1.015 (ref 1.005–1.030)
Urobilinogen, Ur: 2 mg/dL — ABNORMAL HIGH (ref 0.2–1.0)
pH, UA: 6.5 (ref 5.0–7.5)

## 2021-02-18 LAB — MICROSCOPIC EXAMINATION
Bacteria, UA: NONE SEEN
RBC, Urine: NONE SEEN /hpf (ref 0–2)

## 2021-02-18 MED ORDER — BCG LIVE 50 MG IS SUSR
3.2400 mL | Freq: Once | INTRAVESICAL | Status: AC
  Administered 2021-02-18: 81 mg via INTRAVESICAL

## 2021-02-18 NOTE — Progress Notes (Signed)
BCG Bladder Instillation  BCG # 4 of 6  Due to Bladder Cancer patient is present today for a BCG treatment. Patient was cleaned and prepped in a sterile fashion with betadine. A 14FR catheter was inserted, urine return was noted 52ml, urine was yellow in color.  29ml of reconstituted BCG was instilled into the bladder. The catheter was then removed. Patient tolerated well, no complications were noted  Performed by: Elberta Leatherwood, CMA, Gaspar Cola, CMA  Follow up/ Additional notes: 1 week #5

## 2021-02-25 ENCOUNTER — Ambulatory Visit: Payer: Medicare HMO | Admitting: Family Medicine

## 2021-02-25 ENCOUNTER — Other Ambulatory Visit: Payer: Self-pay

## 2021-02-25 DIAGNOSIS — D494 Neoplasm of unspecified behavior of bladder: Secondary | ICD-10-CM | POA: Diagnosis not present

## 2021-02-25 DIAGNOSIS — C678 Malignant neoplasm of overlapping sites of bladder: Secondary | ICD-10-CM | POA: Diagnosis not present

## 2021-02-25 DIAGNOSIS — C679 Malignant neoplasm of bladder, unspecified: Secondary | ICD-10-CM

## 2021-02-25 LAB — URINALYSIS, COMPLETE
Bilirubin, UA: NEGATIVE
Glucose, UA: NEGATIVE
Ketones, UA: NEGATIVE
Leukocytes,UA: NEGATIVE
Nitrite, UA: NEGATIVE
Protein,UA: NEGATIVE
RBC, UA: NEGATIVE
Specific Gravity, UA: >1.03 — ABNORMAL HIGH (ref 1.005–1.030)
Urobilinogen, Ur: 4 mg/dL — ABNORMAL HIGH (ref 0.2–1.0)
pH, UA: 6 (ref 5.0–7.5)

## 2021-02-25 LAB — MICROSCOPIC EXAMINATION: Bacteria, UA: NONE SEEN

## 2021-02-25 MED ORDER — BCG LIVE 50 MG IS SUSR
3.2400 mL | Freq: Once | INTRAVESICAL | Status: AC
  Administered 2021-02-25: 81 mg via INTRAVESICAL

## 2021-02-25 NOTE — Progress Notes (Signed)
BCG Bladder Instillation  BCG # 5 of 6  Due to Bladder Cancer patient is present today for a BCG treatment. Patient was cleaned and prepped in a sterile fashion with betadine. A 14FR catheter was inserted, urine return was noted 73ml, urine was yellow in color.  44ml of reconstituted BCG was instilled into the bladder. The catheter was then removed. Patient tolerated well, no complications were noted  Performed by: Gaspar Cola and Elberta Leatherwood CMA  Follow up/ Additional notes: 1 week

## 2021-03-03 ENCOUNTER — Encounter: Payer: Self-pay | Admitting: Family Medicine

## 2021-03-03 ENCOUNTER — Telehealth: Payer: Self-pay

## 2021-03-03 ENCOUNTER — Other Ambulatory Visit: Payer: Self-pay

## 2021-03-03 ENCOUNTER — Ambulatory Visit (INDEPENDENT_AMBULATORY_CARE_PROVIDER_SITE_OTHER): Payer: Medicare HMO | Admitting: Family Medicine

## 2021-03-03 VITALS — BP 136/82 | HR 76 | Ht 64.5 in | Wt 132.0 lb

## 2021-03-03 DIAGNOSIS — J301 Allergic rhinitis due to pollen: Secondary | ICD-10-CM | POA: Diagnosis not present

## 2021-03-03 DIAGNOSIS — J0100 Acute maxillary sinusitis, unspecified: Secondary | ICD-10-CM

## 2021-03-03 MED ORDER — MONTELUKAST SODIUM 10 MG PO TABS: 10.0000 mg | ORAL_TABLET | Freq: Every day | ORAL | 3 refills | Status: DC

## 2021-03-03 MED ORDER — TRIAMCINOLONE ACETONIDE 55 MCG/ACT NA AERO: 2.0000 | INHALATION_SPRAY | Freq: Every day | NASAL | 12 refills | Status: DC

## 2021-03-03 MED ORDER — AZITHROMYCIN 250 MG PO TABS: ORAL_TABLET | ORAL | 0 refills | Status: AC

## 2021-03-03 NOTE — Progress Notes (Signed)
° ° °Date:  03/03/2021  ° °Name:  Amber Galloway   DOB:  11/23/1940   MRN:  [id number] ° ° °Chief Complaint: Sinusitis (Cough and congestion- no production) ° °Sinusitis °This is a new problem. The current episode started in the past 7 days. The problem has been gradually improving since onset. There has been no fever. The pain is mild. Associated symptoms include congestion, coughing and sinus pressure. Pertinent negatives include no chills, diaphoresis, ear pain, headaches, hoarse voice, neck pain, shortness of breath, sore throat or swollen glands. Past treatments include nothing.  ° °Lab Results  °Component Value Date  ° NA 134 (L) 11/28/2020  ° K 3.9 11/28/2020  ° CO2 28 11/28/2020  ° GLUCOSE 145 (H) 11/28/2020  ° BUN 26 (H) 11/28/2020  ° CREATININE 0.77 11/28/2020  ° CALCIUM 10.0 11/28/2020  ° EGFR 72 05/23/2020  ° GFRNONAA >60 11/28/2020  ° °Lab Results  °Component Value Date  ° CHOL 188 05/23/2020  ° HDL 57 05/23/2020  ° LDLCALC 115 (H) 05/23/2020  ° TRIG 87 05/23/2020  ° CHOLHDL 3.7 03/01/2017  ° °No results found for: TSH °Lab Results  °Component Value Date  ° HGBA1C 6.8 (H) 05/23/2020  ° °Lab Results  °Component Value Date  ° WBC 8.0 11/28/2020  ° HGB 13.2 11/28/2020  ° HCT 38.6 11/28/2020  ° MCV 87.1 11/28/2020  ° PLT 268 11/28/2020  ° °Lab Results  °Component Value Date  ° ALT 12 10/31/2020  ° AST 27 10/31/2020  ° ALKPHOS 92 10/31/2020  ° BILITOT 0.5 10/31/2020  ° °No results found for: 25OHVITD2, 25OHVITD3, VD25OH  ° °Review of Systems  °Constitutional:  Negative for chills, diaphoresis and fever.  °HENT:  Positive for congestion and sinus pressure. Negative for drooling, ear discharge, ear pain, hoarse voice and sore throat.   °Respiratory:  Positive for cough. Negative for shortness of breath and wheezing.   °Cardiovascular:  Negative for chest pain, palpitations and leg swelling.  °Gastrointestinal:  Negative for abdominal pain, blood in stool, constipation, diarrhea and nausea.  °Endocrine: Negative  for polydipsia.  °Genitourinary:  Negative for dysuria, frequency, hematuria and urgency.  °Musculoskeletal:  Negative for back pain, myalgias and neck pain.  °Skin:  Negative for rash.  °Allergic/Immunologic: Negative for environmental allergies.  °Neurological:  Negative for dizziness and headaches.  °Hematological:  Does not bruise/bleed easily.  °Psychiatric/Behavioral:  Negative for suicidal ideas. The patient is not nervous/anxious.   ° °Patient Active Problem List  ° Diagnosis Date Noted  ° Primary osteoarthritis of left knee 06/20/2020  ° Pain and swelling of left lower leg 06/20/2020  ° Pelvic pain 10/25/2019  ° Acute anxiety 08/10/2017  ° Overweight (BMI 25.0-29.9) 03/01/2017  ° Dizziness 06/05/2016  ° Essential hypertension 06/06/2015  ° Mixed hyperlipidemia 06/06/2015  ° Gastroesophageal reflux disease without esophagitis 06/06/2015  ° History of melanoma in situ 05/21/2014  ° ° °No Known Allergies ° °Past Surgical History:  °Procedure Laterality Date  ° APPENDECTOMY    ° CHOLECYSTECTOMY    ° COLONOSCOPY  2014  ° cleared  ° COLONOSCOPY WITH PROPOFOL N/A 07/03/2019  ° Procedure: COLONOSCOPY WITH PROPOFOL;  Surgeon: Toledo, Teodoro K, MD;  Location: [facility];  Service: Gastroenterology;  Laterality: N/A;  ° CYSTOSCOPY WITH BIOPSY N/A 11/29/2020  ° Procedure: CYSTOSCOPY WITH BLADDER BIOPSY;  Surgeon: Sninsky, Brian C, MD;  Location: [facility];  Service: Urology;  Laterality: N/A;  ° MASTECTOMY Right 08/2012  ° TRANSURETHRAL RESECTION OF BLADDER TUMOR WITH MITOMYCIN-C N/A   08/02/2020  ° Procedure: TRANSURETHRAL RESECTION OF BLADDER TUMOR WITH GEMCITABINE;  Surgeon: Sninsky, Brian C, MD;  Location: [facility] ORS;  Service: Urology;  Laterality: N/A;  ° VAGINAL HYSTERECTOMY    ° ° °Social History  ° °Tobacco Use  ° Smoking status: Never  ° Smokeless tobacco: Never  °Vaping Use  ° Vaping Use: Never used  °Substance Use Topics  ° Alcohol use: Not Currently  ° Drug use: Never  ° ° ° °Medication list has been reviewed  and updated. ° °Current Meds  °Medication Sig  ° acetaminophen (TYLENOL) 325 MG tablet Take 325-650 mg by mouth every 6 (six) hours as needed (pain.).  ° ALPRAZolam (XANAX) 0.25 MG tablet Take 1 tablet (0.25 mg total) by mouth daily as needed for anxiety.  ° aspirin EC 81 MG tablet Take 81 mg by mouth in the morning.  ° Biotin 10000 MCG TABS Take 10,000 mcg by mouth in the morning.  ° BIOTIN PO Take 1 tablet by mouth daily.  ° cetirizine (ZYRTEC) 10 MG tablet Take 10 mg by mouth daily as needed for allergies.  ° gemfibrozil (LOPID) 600 MG tablet Take 1 tablet (600 mg total) by mouth at bedtime.  ° hydrochlorothiazide (MICROZIDE) 12.5 MG capsule Take 1 capsule (12.5 mg total) by mouth daily.  ° meclizine (ANTIVERT) 25 MG tablet Take 1 tablet (25 mg total) by mouth 3 (three) times daily as needed for dizziness.  ° metoprolol tartrate (LOPRESSOR) 50 MG tablet Take 1 tablet (50 mg total) by mouth 2 (two) times daily.  ° Multiple Vitamin (MULTIVITAMIN WITH MINERALS) TABS tablet Take 1 tablet by mouth daily.  ° Omega-3 Fatty Acids (FISH OIL) 1000 MG CAPS Take 1,000 mg by mouth in the morning.  ° omeprazole (PRILOSEC) 20 MG capsule TAKE 1 CAPSULE BY MOUTH EVERY DAY  ° simvastatin (ZOCOR) 80 MG tablet Take 1 tablet (80 mg total) by mouth at bedtime.  ° ° °PHQ 2/9 Scores 12/13/2020 11/25/2020 10/30/2020 07/04/2020  °PHQ - 2 Score 0 0 2 0  °PHQ- 9 Score 6 3 4 0  ° ° °GAD 7 : Generalized Anxiety Score 12/13/2020 11/25/2020 07/04/2020 06/20/2020  °Nervous, Anxious, on Edge 0 0 0 0  °Control/stop worrying 0 0 0 0  °Worry too much - different things 0 0 0 0  °Trouble relaxing 0 0 0 0  °Restless 0 0 0 0  °Easily annoyed or irritable 0 0 0 0  °Afraid - awful might happen 0 0 0 0  °Total GAD 7 Score 0 0 0 0  °Anxiety Difficulty Not difficult at all - Not difficult at all Not difficult at all  ° ° °BP Readings from Last 3 Encounters:  °03/03/21 136/82  °12/13/20 136/82  °11/29/20 (!) 162/65  ° ° °Physical Exam °Vitals and nursing note  reviewed.  °Constitutional:   °   Appearance: She is well-developed.  °HENT:  °   Head: Normocephalic.  °   Right Ear: Tympanic membrane and external ear normal.  °   Left Ear: Tympanic membrane and external ear normal.  °   Nose: No congestion or rhinorrhea.  °   Right Sinus: Maxillary sinus tenderness present.  °   Left Sinus: Maxillary sinus tenderness present.  °Eyes:  °   General: Lids are everted, no foreign bodies appreciated. No scleral icterus.    °   Left eye: No foreign body or hordeolum.  °   Conjunctiva/sclera: Conjunctivae normal.  °   Right eye: Right conjunctiva   is not injected.  °   Left eye: Left conjunctiva is not injected.  °   Pupils: Pupils are equal, round, and reactive to light.  °Neck:  °   Thyroid: No thyromegaly.  °   Vascular: No JVD.  °   Trachea: No tracheal deviation.  °Cardiovascular:  °   Rate and Rhythm: Normal rate and regular rhythm.  °   Heart sounds: Normal heart sounds. No murmur heard. °  No friction rub. No gallop.  °Pulmonary:  °   Effort: Pulmonary effort is normal. No respiratory distress.  °   Breath sounds: Normal breath sounds. No wheezing, rhonchi or rales.  °Abdominal:  °   General: Bowel sounds are normal.  °   Palpations: Abdomen is soft. There is no mass.  °   Tenderness: There is no abdominal tenderness. There is no guarding or rebound.  °Musculoskeletal:     °   General: No tenderness. Normal range of motion.  °   Cervical back: Normal range of motion and neck supple.  °Lymphadenopathy:  °   Cervical: No cervical adenopathy.  °Skin: °   General: Skin is warm.  °   Findings: No rash.  °Neurological:  °   Mental Status: She is alert and oriented to person, place, and time.  °   Cranial Nerves: No cranial nerve deficit.  °   Deep Tendon Reflexes: Reflexes normal.  °Psychiatric:     °   Mood and Affect: Mood is not anxious or depressed.  ° ° °Wt Readings from Last 3 Encounters:  °03/03/21 132 lb (59.9 kg)  °12/13/20 138 lb 9.6 oz (62.9 kg)  °11/29/20 134 lb (60.8 kg)   ° ° °BP 136/82    Pulse 76    Ht 5' 4.5" (1.638 m)    Wt 132 lb (59.9 kg)    BMI 22.31 kg/m²  ° °Assessment and Plan: ° °1. Acute non-recurrent maxillary sinusitis °Acute.  Persistent.  Relatively stable.  History and examination consistent with an acute maxillary sinusitis.  We will treat with azithromycin to 50 mg 2 today followed by 1 a day for 4 days. °- azithromycin (ZITHROMAX) 250 MG tablet; Take 2 tablets on day 1, then 1 tablet daily on days 2 through 5  Dispense: 6 tablet; Refill: 0 ° °2. Seasonal allergic rhinitis due to pollen °New onset.  Persistent.  Similar to previous early spring most likely secondary to pollen exposure.  We will treat with Singulair 10 mg once a day Nasacort 2 sprays into nose daily as well as suggestion of using Mucinex DM for cough. °- montelukast (SINGULAIR) 10 MG tablet; Take 1 tablet (10 mg total) by mouth at bedtime.  Dispense: 30 tablet; Refill: 3 °- triamcinolone (NASACORT) 55 MCG/ACT AERO nasal inhaler; Place 2 sprays into the nose daily.  Dispense: 1 each; Refill: 12  ° ° °

## 2021-03-03 NOTE — Telephone Encounter (Signed)
Patient left message on triage line for Dr. Diamantina [name] pt is scheduled tomorrow for BCG treatment. She is currently sick with a bad cough and sinus problems. She would like to know if she needs to reschedule. Pt was rescheduled for next week.

## 2021-03-04 ENCOUNTER — Ambulatory Visit: Payer: Medicare HMO

## 2021-03-11 ENCOUNTER — Ambulatory Visit: Payer: Medicare HMO | Admitting: *Deleted

## 2021-03-11 ENCOUNTER — Other Ambulatory Visit: Payer: Self-pay

## 2021-03-11 DIAGNOSIS — R310 Gross hematuria: Secondary | ICD-10-CM | POA: Diagnosis not present

## 2021-03-11 DIAGNOSIS — C678 Malignant neoplasm of overlapping sites of bladder: Secondary | ICD-10-CM

## 2021-03-11 LAB — URINALYSIS, COMPLETE
Bilirubin, UA: NEGATIVE
Glucose, UA: NEGATIVE
Ketones, UA: NEGATIVE
Leukocytes,UA: NEGATIVE
Nitrite, UA: NEGATIVE
Protein,UA: NEGATIVE
RBC, UA: NEGATIVE
Specific Gravity, UA: 1.02 (ref 1.005–1.030)
Urobilinogen, Ur: 4 mg/dL — ABNORMAL HIGH (ref 0.2–1.0)
pH, UA: 7 (ref 5.0–7.5)

## 2021-03-11 LAB — MICROSCOPIC EXAMINATION: Bacteria, UA: NONE SEEN

## 2021-03-11 MED ORDER — BCG LIVE 50 MG IS SUSR
3.2400 mL | Freq: Once | INTRAVESICAL | Status: AC
  Administered 2021-03-11: 50 mg via INTRAVESICAL

## 2021-03-11 NOTE — Patient Instructions (Signed)

## 2021-03-11 NOTE — Progress Notes (Signed)
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 Patient was cleaned and prepped in a sterile fashion with betadine. A 14FR catheter was inserted, urine return was noted 12ml, urine was yellow in color.  32ml of reconstituted BCG was instilled into the bladder. The catheter was then removed. Patient tolerated well, no complications were noted   Performed by: Gaspar Cola and Elberta Leatherwood CMA   Follow up/ Additional notes: 3 months

## 2021-03-30 ENCOUNTER — Other Ambulatory Visit: Payer: Self-pay | Admitting: Family Medicine

## 2021-03-30 DIAGNOSIS — J301 Allergic rhinitis due to pollen: Secondary | ICD-10-CM

## 2021-05-12 DIAGNOSIS — H2513 Age-related nuclear cataract, bilateral: Secondary | ICD-10-CM | POA: Diagnosis not present

## 2021-05-12 DIAGNOSIS — H40013 Open angle with borderline findings, low risk, bilateral: Secondary | ICD-10-CM | POA: Diagnosis not present

## 2021-06-11 ENCOUNTER — Other Ambulatory Visit: Payer: Self-pay | Admitting: Family Medicine

## 2021-06-11 DIAGNOSIS — K219 Gastro-esophageal reflux disease without esophagitis: Secondary | ICD-10-CM

## 2021-06-11 DIAGNOSIS — I10 Essential (primary) hypertension: Secondary | ICD-10-CM

## 2021-06-11 DIAGNOSIS — E782 Mixed hyperlipidemia: Secondary | ICD-10-CM

## 2021-06-12 NOTE — Telephone Encounter (Signed)
Requested Prescriptions  Pending Prescriptions Disp Refills  . hydrochlorothiazide (MICROZIDE) 12.5 MG capsule [Pharmacy Med Name: HYDROCHLOROTHIAZIDE 12.5 MG CP] 90 capsule 0    Sig: TAKE 1 CAPSULE BY MOUTH EVERY DAY     Cardiovascular: Diuretics - Thiazide Failed - 06/11/2021 12:48 PM      Failed - Cr in normal range and within 180 days    Creatinine  Date Value Ref Range Status  04/14/2013 0.80 0.60 - 1.30 mg/dL Final   Creatinine, Ser  Date Value Ref Range Status  11/28/2020 0.77 0.44 - 1.00 mg/dL Final         Failed - K in normal range and within 180 days    Potassium  Date Value Ref Range Status  11/28/2020 3.9 3.5 - 5.1 mmol/L Final  04/14/2013 3.8 3.5 - 5.1 mmol/L Final         Failed - Na in normal range and within 180 days    Sodium  Date Value Ref Range Status  11/28/2020 134 (L) 135 - 145 mmol/L Final  05/23/2020 139 134 - 144 mmol/L Final  04/14/2013 140 136 - 145 mmol/L Final         Passed - Last BP in normal range    BP Readings from Last 1 Encounters:  03/03/21 136/82         Passed - Valid encounter within last 6 months    Recent Outpatient Visits          3 months ago Acute non-recurrent maxillary sinusitis   [facility] Juline Patch, MD   6 months ago Acute non-recurrent maxillary sinusitis   [facility] Glean Hess, MD   6 months ago Need for immunization against influenza   [facility] Juline Patch, MD   7 months ago [facility] Juline Patch, MD   11 months ago Primary osteoarthritis of left knee   [facility] Montel Culver, MD             . metoprolol tartrate (LOPRESSOR) 50 MG tablet [Pharmacy Med Name: METOPROLOL TARTRATE 50 MG TAB] 180 tablet 0    Sig: TAKE 1 TABLET BY MOUTH TWICE A DAY     Cardiovascular:  Beta Blockers Passed - 06/11/2021 12:48 PM      Passed - Last BP in normal range    BP Readings from Last 1 Encounters:  03/03/21 136/82          Passed - Last Heart Rate in normal range    Pulse Readings from Last 1 Encounters:  03/03/21 76         Passed - Valid encounter within last 6 months    Recent Outpatient Visits          3 months ago Acute non-recurrent maxillary sinusitis   [facility] Juline Patch, MD   6 months ago Acute non-recurrent maxillary sinusitis   [facility] Glean Hess, MD   6 months ago Need for immunization against influenza   [facility] Juline Patch, MD   7 months ago [facility] Juline Patch, MD   11 months ago Primary osteoarthritis of left knee   [facility] Montel Culver, MD             . omeprazole (PRILOSEC) 20 MG capsule [Pharmacy Med Name: OMEPRAZOLE DR 20 MG CAPSULE] 90 capsule 1    Sig:  TAKE 1 CAPSULE BY MOUTH EVERY DAY     Gastroenterology: Proton Pump Inhibitors Passed - 06/11/2021 12:48 PM      Passed - Valid encounter within last 12 months    Recent Outpatient Visits          3 months ago Acute non-recurrent maxillary sinusitis   [facility] Juline Patch, MD   6 months ago Acute non-recurrent maxillary sinusitis   [facility] Glean Hess, MD   6 months ago Need for immunization against influenza   [facility] Juline Patch, MD   7 months ago [facility] Juline Patch, MD   11 months ago Primary osteoarthritis of left knee   [facility] Montel Culver, MD             . gemfibrozil (LOPID) 600 MG tablet [Pharmacy Med Name: GEMFIBROZIL 600 MG TABLET] 90 tablet 1    Sig: TAKE 1 TABLET BY MOUTH AT BEDTIME.     Cardiovascular:  Antilipid - Fibric Acid Derivatives Failed - 06/11/2021 12:48 PM      Failed - Lipid Panel in normal range within the last 12 months    Cholesterol, Total  Date Value Ref Range Status  05/23/2020 188 100 - 199 mg/dL Final   LDL Chol Calc (NIH)  Date Value Ref Range Status  05/23/2020 115  (H) 0 - 99 mg/dL Final   HDL  Date Value Ref Range Status  05/23/2020 57 >39 mg/dL Final   Triglycerides  Date Value Ref Range Status  05/23/2020 87 0 - 149 mg/dL Final         Passed - ALT in normal range and within 360 days    ALT  Date Value Ref Range Status  10/31/2020 12 0 - 32 IU/L Final   SGPT (ALT)  Date Value Ref Range Status  04/14/2013 20 12 - 78 U/L Final         Passed - AST in normal range and within 360 days    AST  Date Value Ref Range Status  10/31/2020 27 0 - 40 IU/L Final   SGOT(AST)  Date Value Ref Range Status  04/14/2013 21 15 - 37 Unit/L Final         Passed - Cr in normal range and within 360 days    Creatinine  Date Value Ref Range Status  04/14/2013 0.80 0.60 - 1.30 mg/dL Final   Creatinine, Ser  Date Value Ref Range Status  11/28/2020 0.77 0.44 - 1.00 mg/dL Final         Passed - HGB in normal range and within 360 days    Hemoglobin  Date Value Ref Range Status  11/28/2020 13.2 12.0 - 15.0 g/dL Final  02/06/2019 13.8 11.1 - 15.9 g/dL Final         Passed - HCT in normal range and within 360 days    HCT  Date Value Ref Range Status  11/28/2020 38.6 36.0 - 46.0 % Final   Hematocrit  Date Value Ref Range Status  02/06/2019 40.0 34.0 - 46.6 % Final         Passed - PLT in normal range and within 360 days    Platelets  Date Value Ref Range Status  11/28/2020 268 150 - 400 K/uL Final  02/06/2019 206 150 - 450 x10E3/uL Final         Passed - WBC in normal range and within 360 days  WBC  Date Value Ref Range Status  11/28/2020 8.0 4.0 - 10.5 K/uL Final         Passed - eGFR is 30 or above and within 360 days    EGFR (African American)  Date Value Ref Range Status  04/14/2013 >60  Final   GFR calc Af Amer  Date Value Ref Range Status  06/01/2019 >60 >60 mL/min Final   EGFR (Non-African Amer.)  Date Value Ref Range Status  04/14/2013 >60  Final    Comment:    eGFR values <51m/min/1.73 m2 may be an indication of  chronic kidney disease (CKD). Calculated eGFR is useful in patients with stable renal function. The eGFR calculation will not be reliable in acutely ill patients when serum creatinine is changing rapidly. It is not useful in  patients on dialysis. The eGFR calculation may not be applicable to patients at the low and high extremes of body sizes, pregnant women, and vegetarians.    GFR, Estimated  Date Value Ref Range Status  11/28/2020 >60 >60 mL/min Final    Comment:    (NOTE) Calculated using the CKD-EPI Creatinine Equation (2021)    eGFR  Date Value Ref Range Status  05/23/2020 72 >59 mL/min/1.73 Final         Passed - Valid encounter within last 12 months    Recent Outpatient Visits          3 months ago [facility]   6 months ago Acute non-recurrent maxillary sinusitis   [facility]   6 months ago Need for immunization against influenza   [facility]   [facility]   11 months ago Primary osteoarthritis of left knee   [facility]

## 2021-07-03 ENCOUNTER — Ambulatory Visit: Payer: Medicare HMO | Admitting: Urology

## 2021-07-03 ENCOUNTER — Encounter: Payer: Self-pay | Admitting: Urology

## 2021-07-03 VITALS — BP 148/75 | HR 79

## 2021-07-03 DIAGNOSIS — Z8551 Personal history of malignant neoplasm of bladder: Secondary | ICD-10-CM | POA: Diagnosis not present

## 2021-07-03 DIAGNOSIS — C678 Malignant neoplasm of overlapping sites of bladder: Secondary | ICD-10-CM | POA: Diagnosis not present

## 2021-07-04 LAB — URINALYSIS, COMPLETE
Bilirubin, UA: NEGATIVE
Glucose, UA: NEGATIVE
Ketones, UA: NEGATIVE
Leukocytes,UA: NEGATIVE
Nitrite, UA: NEGATIVE
Protein,UA: NEGATIVE
RBC, UA: NEGATIVE
Specific Gravity, UA: <1.005 — ABNORMAL LOW (ref 1.005–1.030)
Urobilinogen, Ur: 2 mg/dL — ABNORMAL HIGH (ref 0.2–1.0)
pH, UA: 6 (ref 5.0–7.5)

## 2021-07-04 LAB — MICROSCOPIC EXAMINATION: Bacteria, UA: NONE SEEN

## 2021-08-21 DIAGNOSIS — L570 Actinic keratosis: Secondary | ICD-10-CM | POA: Diagnosis not present

## 2021-08-21 DIAGNOSIS — L821 Other seborrheic keratosis: Secondary | ICD-10-CM | POA: Diagnosis not present

## 2021-09-02 DIAGNOSIS — H2513 Age-related nuclear cataract, bilateral: Secondary | ICD-10-CM | POA: Diagnosis not present

## 2021-09-02 DIAGNOSIS — H4010X1 Unspecified open-angle glaucoma, mild stage: Secondary | ICD-10-CM | POA: Diagnosis not present

## 2021-09-05 ENCOUNTER — Other Ambulatory Visit: Payer: Self-pay | Admitting: Family Medicine

## 2021-09-05 DIAGNOSIS — E782 Mixed hyperlipidemia: Secondary | ICD-10-CM

## 2021-09-10 ENCOUNTER — Other Ambulatory Visit: Payer: Self-pay | Admitting: Family Medicine

## 2021-09-10 DIAGNOSIS — I10 Essential (primary) hypertension: Secondary | ICD-10-CM

## 2021-09-10 NOTE — Telephone Encounter (Signed)
Courtesy refill. Requested Prescriptions  Pending Prescriptions Disp Refills  . metoprolol tartrate (LOPRESSOR) 50 MG tablet [Pharmacy Med Name: METOPROLOL TARTRATE 50 MG TAB] 60 tablet 0    Sig: TAKE 1 TABLET BY MOUTH TWICE A DAY     Cardiovascular:  Beta Blockers Failed - 09/10/2021  3:21 AM      Failed - Last BP in normal range    BP Readings from Last 1 Encounters:  07/03/21 (!) 148/75         Failed - Valid encounter within last 6 months    Recent Outpatient Visits          6 months ago Acute non-recurrent maxillary sinusitis   [facility] [place], Deanna C, MD   9 months ago Acute non-recurrent maxillary sinusitis   [facility], Jesse Sans, MD   9 months ago Need for immunization against influenza   [facility] [place], Deanna C, MD   10 months ago Cystitis   [facility] [place], Deanna C, MD   1 year ago Primary osteoarthritis of left knee    [facility] [place], MD             Passed - Last Heart Rate in normal range    Pulse Readings from Last 1 Encounters:  07/03/21 79

## 2021-09-12 ENCOUNTER — Telehealth: Payer: Self-pay | Admitting: Family Medicine

## 2021-09-12 NOTE — Telephone Encounter (Signed)
Copied from [place] [phone]. Topic: General - Call Back - No Documentation >> Sep 12, 2021  9:33 AM Leitha Schuller wrote: Pt returning call  Pt inquiring what type of appointment she needs to schedule per VM  Pt states if she does not answer Leave a detail VM  Please assist pt further

## 2021-09-16 ENCOUNTER — Encounter: Payer: Self-pay | Admitting: Family Medicine

## 2021-09-16 ENCOUNTER — Ambulatory Visit (INDEPENDENT_AMBULATORY_CARE_PROVIDER_SITE_OTHER): Payer: Medicare HMO | Admitting: Family Medicine

## 2021-09-16 VITALS — BP 120/84 | HR 68 | Ht 64.5 in | Wt 122.0 lb

## 2021-09-16 DIAGNOSIS — E782 Mixed hyperlipidemia: Secondary | ICD-10-CM | POA: Diagnosis not present

## 2021-09-16 DIAGNOSIS — Z23 Encounter for immunization: Secondary | ICD-10-CM

## 2021-09-16 DIAGNOSIS — K219 Gastro-esophageal reflux disease without esophagitis: Secondary | ICD-10-CM | POA: Diagnosis not present

## 2021-09-16 DIAGNOSIS — I10 Essential (primary) hypertension: Secondary | ICD-10-CM

## 2021-09-16 MED ORDER — OMEPRAZOLE 20 MG PO CPDR: 20.0000 mg | DELAYED_RELEASE_CAPSULE | Freq: Every day | ORAL | 1 refills | Status: DC

## 2021-09-16 MED ORDER — HYDROCHLOROTHIAZIDE 12.5 MG PO CAPS: ORAL_CAPSULE | ORAL | 1 refills | Status: DC

## 2021-09-16 MED ORDER — SIMVASTATIN 80 MG PO TABS: ORAL_TABLET | ORAL | 1 refills | Status: DC

## 2021-09-16 MED ORDER — METOPROLOL TARTRATE 50 MG PO TABS: 50.0000 mg | ORAL_TABLET | Freq: Two times a day (BID) | ORAL | 1 refills | Status: DC

## 2021-09-16 NOTE — Progress Notes (Signed)
Date:  09/16/2021   Name:  Amber Galloway   DOB:  05/06/1940   MRN:  [id number]   Chief Complaint: Hyperlipidemia, Hypertension, Gastroesophageal Reflux, and Flu Vaccine  Hyperlipidemia This is a chronic problem. The current episode started more than 1 year ago. The problem is controlled. Recent lipid tests were reviewed and are normal. She has no history of chronic renal disease, diabetes or hypothyroidism. There are no known factors aggravating her hyperlipidemia. Pertinent negatives include no chest pain, myalgias or shortness of breath. Current antihyperlipidemic treatment includes statins and fibric acid derivatives. The current treatment provides moderate improvement of lipids. There are no compliance problems.   Hypertension This is a chronic problem. The current episode started more than 1 year ago. The problem has been gradually improving since onset. Pertinent negatives include no blurred vision, chest pain, headaches, neck pain, orthopnea, palpitations, PND or shortness of breath. Past treatments include beta blockers and diuretics. The current treatment provides moderate improvement. There are no compliance problems.  There is no history of angina, kidney disease, CAD/MI, CVA, heart failure, left ventricular hypertrophy, PVD or retinopathy. There is no history of chronic renal disease, a hypertension causing med or renovascular disease.  Gastroesophageal Reflux She reports no abdominal pain, no chest pain, no choking, no coughing, no dysphagia, no heartburn, no nausea, no sore throat or no wheezing. This is a chronic problem. The current episode started more than 1 year ago. The problem has been gradually improving. The symptoms are aggravated by certain foods. She has tried a PPI for the symptoms.    Lab Results  Component Value Date   NA 134 (L) 11/28/2020   K 3.9 11/28/2020   CO2 28 11/28/2020   GLUCOSE 145 (H) 11/28/2020   BUN 26 (H) 11/28/2020   CREATININE 0.77 11/28/2020    CALCIUM 10.0 11/28/2020   EGFR 72 05/23/2020   GFRNONAA >60 11/28/2020   Lab Results  Component Value Date   CHOL 188 05/23/2020   HDL 57 05/23/2020   LDLCALC 115 (H) 05/23/2020   TRIG 87 05/23/2020   CHOLHDL 3.7 03/01/2017   No results found for: "TSH" Lab Results  Component Value Date   HGBA1C 6.8 (H) 05/23/2020   Lab Results  Component Value Date   WBC 8.0 11/28/2020   HGB 13.2 11/28/2020   HCT 38.6 11/28/2020   MCV 87.1 11/28/2020   PLT 268 11/28/2020   Lab Results  Component Value Date   ALT 12 10/31/2020   AST 27 10/31/2020   ALKPHOS 92 10/31/2020   BILITOT 0.5 10/31/2020   No results found for: "25OHVITD2", "25OHVITD3", "VD25OH"   Review of Systems  Constitutional:  Negative for chills and fever.  HENT:  Negative for drooling, ear discharge, ear pain and sore throat.   Eyes:  Negative for blurred vision.  Respiratory:  Negative for cough, choking, shortness of breath and wheezing.   Cardiovascular:  Negative for chest pain, palpitations, orthopnea, leg swelling and PND.  Gastrointestinal:  Negative for abdominal pain, blood in stool, constipation, diarrhea, dysphagia, heartburn and nausea.  Endocrine: Negative for polydipsia.  Genitourinary:  Negative for dysuria, frequency, hematuria and urgency.  Musculoskeletal:  Negative for back pain, myalgias and neck pain.  Skin:  Negative for rash.  Allergic/Immunologic: Negative for environmental allergies.  Neurological:  Negative for dizziness and headaches.  Hematological:  Does not bruise/bleed easily.  Psychiatric/Behavioral:  Negative for suicidal ideas. The patient is not nervous/anxious.     Patient Active Problem List  Diagnosis Date Noted   Primary osteoarthritis of left knee 06/20/2020   Pain and swelling of left lower leg 06/20/2020   Pelvic pain 10/25/2019   Acute anxiety 08/10/2017   Overweight (BMI 25.0-29.9) 03/01/2017   Dizziness 06/05/2016   Essential hypertension 06/06/2015   Mixed  hyperlipidemia 06/06/2015   Gastroesophageal reflux disease without esophagitis 06/06/2015   History of melanoma in situ 05/21/2014    No Known Allergies  Past Surgical History:  Procedure Laterality Date   APPENDECTOMY     CHOLECYSTECTOMY     COLONOSCOPY  2014   cleared   COLONOSCOPY WITH PROPOFOL N/A 07/03/2019   Procedure: COLONOSCOPY WITH PROPOFOL;  Surgeon: Toledo, Benay Pike, MD;  Location: [facility];  Service: Gastroenterology;  Laterality: N/A;   CYSTOSCOPY WITH BIOPSY N/A 11/29/2020   Procedure: CYSTOSCOPY WITH BLADDER BIOPSY;  Surgeon: [name], MD;  Location: [facility] ORS;  Service: Urology;  Laterality: N/A;   MASTECTOMY Right 08/2012   TRANSURETHRAL RESECTION OF BLADDER TUMOR WITH MITOMYCIN-C N/A 08/02/2020   Procedure: TRANSURETHRAL RESECTION OF BLADDER TUMOR WITH GEMCITABINE;  Surgeon: [name], MD;  Location: [facility] ORS;  Service: Urology;  Laterality: N/A;   VAGINAL HYSTERECTOMY      Social History   Tobacco Use   Smoking status: Never    Passive exposure: Never   Smokeless tobacco: Never  Vaping Use   Vaping Use: Never used  Substance Use Topics   Alcohol use: Not Currently   Drug use: Never     Medication list has been reviewed and updated.  Current Meds  Medication Sig   acetaminophen (TYLENOL) 325 MG tablet Take 325-650 mg by mouth every 6 (six) hours as needed (pain.).   aspirin EC 81 MG tablet Take 81 mg by mouth in the morning.   Biotin 10000 MCG TABS Take 10,000 mcg by mouth in the morning.   cetirizine (ZYRTEC) 10 MG tablet Take 10 mg by mouth daily as needed for allergies.   gemfibrozil (LOPID) 600 MG tablet TAKE 1 TABLET BY MOUTH AT BEDTIME.   hydrochlorothiazide (MICROZIDE) 12.5 MG capsule TAKE 1 CAPSULE BY MOUTH EVERY DAY   latanoprost (XALATAN) 0.005 % ophthalmic solution Place 1 drop into both eyes daily.   meclizine (ANTIVERT) 25 MG tablet Take 1 tablet (25 mg total) by mouth 3 (three) times daily as needed for dizziness.    metoprolol tartrate (LOPRESSOR) 50 MG tablet TAKE 1 TABLET BY MOUTH TWICE A DAY   Multiple Vitamin (MULTIVITAMIN WITH MINERALS) TABS tablet Take 1 tablet by mouth daily.   Omega-3 Fatty Acids (FISH OIL) 1000 MG CAPS Take 1,000 mg by mouth in the morning.   omeprazole (PRILOSEC) 20 MG capsule TAKE 1 CAPSULE BY MOUTH EVERY DAY   simvastatin (ZOCOR) 80 MG tablet TAKE 1 TABLET BY MOUTH EVERYDAY AT BEDTIME       09/16/2021    9:07 AM 12/13/2020   11:28 AM 11/25/2020    8:32 AM 07/04/2020    1:25 PM  GAD 7 : Generalized Anxiety Score  Nervous, Anxious, on Edge 0 0 0 0  Control/stop worrying 0 0 0 0  Worry too much - different things 0 0 0 0  Trouble relaxing 0 0 0 0  Restless 0 0 0 0  Easily annoyed or irritable 0 0 0 0  Afraid - awful might happen 0 0 0 0  Total GAD 7 Score 0 0 0 0  Anxiety Difficulty Not difficult at all Not difficult at all  Not  difficult at all       09/16/2021    9:07 AM 12/13/2020   11:28 AM 11/25/2020    8:32 AM  Depression screen PHQ 2/9  Decreased Interest 0 0 0  Down, Depressed, Hopeless 0 0 0  PHQ - 2 Score 0 0 0  Altered sleeping 0 0 0  Tired, decreased energy 0 3 3  Change in appetite 0 3 0  Feeling bad or failure about yourself  0 0 0  Trouble concentrating 0 0 0  Moving slowly or fidgety/restless 0 0 0  Suicidal thoughts 0 0 0  PHQ-9 Score 0 6 3  Difficult doing work/chores Not difficult at all Not difficult at all Not difficult at all    BP Readings from Last 3 Encounters:  09/16/21 120/84  07/03/21 (!) 148/75  03/03/21 136/82    Physical Exam Vitals and nursing note reviewed. Exam conducted with a chaperone present.  Constitutional:      General: She is not in acute distress.    Appearance: She is not diaphoretic.  HENT:     Head: Normocephalic and atraumatic.     Right Ear: External ear normal.     Left Ear: External ear normal.     Nose: Nose normal.  Eyes:     General:        Right eye: No discharge.        Left eye: No  discharge.     Conjunctiva/sclera: Conjunctivae normal.     Pupils: Pupils are equal, round, and reactive to light.  Neck:     Thyroid: No thyromegaly.     Vascular: No JVD.  Cardiovascular:     Rate and Rhythm: Normal rate and regular rhythm.     Pulses: Normal pulses.     Heart sounds: Normal heart sounds, S1 normal and S2 normal. No murmur heard.    No systolic murmur is present.     No diastolic murmur is present.     No friction rub. No gallop. No S3 or S4 sounds.  Pulmonary:     Effort: Pulmonary effort is normal.     Breath sounds: Normal breath sounds.  Abdominal:     General: Bowel sounds are normal.     Palpations: Abdomen is soft. There is no mass.     Tenderness: There is no abdominal tenderness. There is no guarding or rebound.  Musculoskeletal:        General: Normal range of motion.     Cervical back: Normal range of motion and neck supple.     Right lower leg: No edema.     Left lower leg: No edema.  Lymphadenopathy:     Cervical: No cervical adenopathy.  Skin:    General: Skin is warm and dry.     Findings: Bruising present.  Neurological:     Mental Status: She is alert.     Deep Tendon Reflexes: Reflexes are normal and symmetric.     Wt Readings from Last 3 Encounters:  09/16/21 122 lb (55.3 kg)  03/03/21 132 lb (59.9 kg)  12/13/20 138 lb 9.6 oz (62.9 kg)    BP 120/84   Pulse 68   Ht 5' 4.5" (1.638 m)   Wt 122 lb (55.3 kg)   BMI 20.62 kg/m   Assessment and Plan:  1. Mixed hyperlipidemia Chronic.  Controlled.  Stable.  The patient had recent dental work and has been difficult to swallow with the fenofibrate.  We will continue simvastatin  80 mg once a day we will check lipid panel.  We will discontinue fenofibrate and will evaluate after lipid panel. - simvastatin (ZOCOR) 80 MG tablet; TAKE 1 TABLET BY MOUTH EVERYDAY AT BEDTIME  Dispense: 90 tablet; Refill: 1 - Lipid Panel With LDL/HDL Ratio  2. Essential hypertension Chronic.  Controlled.   Stable.  Blood pressure 120/84.  Continue metoprolol 50 mg twice a day and hydrochlorothiazide 12.5 mg once a day.  We will check CMP for electrolytes and GFR. - hydrochlorothiazide (MICROZIDE) 12.5 MG capsule; TAKE 1 CAPSULE BY MOUTH EVERY DAY  Dispense: 90 capsule; Refill: 1 - metoprolol tartrate (LOPRESSOR) 50 MG tablet; Take 1 tablet (50 mg total) by mouth 2 (two) times daily.  Dispense: 90 tablet; Refill: 1 - Comprehensive Metabolic Panel (CMET)  3. Gastroesophageal reflux disease without esophagitis Chronic.  Controlled.  Stable.  Continue omeprazole 20 - omeprazole (PRILOSEC) 20 MG capsule; Take 1 capsule (20 mg total) by mouth daily.  Dispense: 90 capsule; Refill: 1  4. Need for immunization against influenza Discussed and administered. - Flu Vaccine QUAD 5moIM (Fluarix, Fluzone & Alfiuria Quad PF)    DOtilio Miu MD

## 2021-09-17 ENCOUNTER — Ambulatory Visit: Payer: Medicare HMO | Admitting: Family Medicine

## 2021-09-17 LAB — COMPREHENSIVE METABOLIC PANEL
ALT: 10 IU/L (ref 0–32)
AST: 21 IU/L (ref 0–40)
Albumin/Globulin Ratio: 2 (ref 1.2–2.2)
Albumin: 4.7 g/dL (ref 3.7–4.7)
Alkaline Phosphatase: 94 IU/L (ref 44–121)
BUN/Creatinine Ratio: 20 (ref 12–28)
BUN: 15 mg/dL (ref 8–27)
Bilirubin Total: 0.7 mg/dL (ref 0.0–1.2)
CO2: 26 mmol/L (ref 20–29)
Calcium: 10.3 mg/dL (ref 8.7–10.3)
Chloride: 98 mmol/L (ref 96–106)
Creatinine, Ser: 0.75 mg/dL (ref 0.57–1.00)
Globulin, Total: 2.4 g/dL (ref 1.5–4.5)
Glucose: 119 mg/dL — ABNORMAL HIGH (ref 70–99)
Potassium: 4.4 mmol/L (ref 3.5–5.2)
Sodium: 140 mmol/L (ref 134–144)
Total Protein: 7.1 g/dL (ref 6.0–8.5)
eGFR: 79 mL/min/{1.73_m2} (ref >59)

## 2021-09-17 LAB — LIPID PANEL WITH LDL/HDL RATIO
Cholesterol, Total: 192 mg/dL (ref 100–199)
HDL: 58 mg/dL (ref >39)
LDL Chol Calc (NIH): 106 mg/dL — ABNORMAL HIGH (ref 0–99)
LDL/HDL Ratio: 1.8 ratio (ref 0.0–3.2)
Triglycerides: 162 mg/dL — ABNORMAL HIGH (ref 0–149)
VLDL Cholesterol Cal: 28 mg/dL (ref 5–40)

## 2021-10-06 DIAGNOSIS — Z809 Family history of malignant neoplasm, unspecified: Secondary | ICD-10-CM | POA: Diagnosis not present

## 2021-10-06 DIAGNOSIS — H409 Unspecified glaucoma: Secondary | ICD-10-CM | POA: Diagnosis not present

## 2021-10-06 DIAGNOSIS — C679 Malignant neoplasm of bladder, unspecified: Secondary | ICD-10-CM | POA: Diagnosis not present

## 2021-10-06 DIAGNOSIS — M1990 Unspecified osteoarthritis, unspecified site: Secondary | ICD-10-CM | POA: Diagnosis not present

## 2021-10-06 DIAGNOSIS — I252 Old myocardial infarction: Secondary | ICD-10-CM | POA: Diagnosis not present

## 2021-10-06 DIAGNOSIS — I10 Essential (primary) hypertension: Secondary | ICD-10-CM | POA: Diagnosis not present

## 2021-10-06 DIAGNOSIS — E785 Hyperlipidemia, unspecified: Secondary | ICD-10-CM | POA: Diagnosis not present

## 2021-10-06 DIAGNOSIS — R42 Dizziness and giddiness: Secondary | ICD-10-CM | POA: Diagnosis not present

## 2021-10-06 DIAGNOSIS — I2510 Atherosclerotic heart disease of native coronary artery without angina pectoris: Secondary | ICD-10-CM | POA: Diagnosis not present

## 2021-10-06 DIAGNOSIS — K219 Gastro-esophageal reflux disease without esophagitis: Secondary | ICD-10-CM | POA: Diagnosis not present

## 2021-10-06 DIAGNOSIS — R32 Unspecified urinary incontinence: Secondary | ICD-10-CM | POA: Diagnosis not present

## 2021-10-06 DIAGNOSIS — Z7982 Long term (current) use of aspirin: Secondary | ICD-10-CM | POA: Diagnosis not present

## 2021-10-06 DIAGNOSIS — Z008 Encounter for other general examination: Secondary | ICD-10-CM | POA: Diagnosis not present

## 2021-11-03 ENCOUNTER — Ambulatory Visit: Payer: Medicare HMO

## 2021-11-05 ENCOUNTER — Ambulatory Visit (INDEPENDENT_AMBULATORY_CARE_PROVIDER_SITE_OTHER): Payer: Medicare HMO

## 2021-11-05 VITALS — Ht 64.5 in | Wt 122.0 lb

## 2021-11-05 DIAGNOSIS — Z0000 Encounter for general adult medical examination without abnormal findings: Secondary | ICD-10-CM | POA: Diagnosis not present

## 2021-11-05 NOTE — Progress Notes (Signed)
Subjective:   Amber Galloway is a 81 y.o. female who presents for Medicare Annual (Subsequent) preventive examination.  I connected with  [name] on 11/05/21 by a audio enabled telemedicine application and verified that I am speaking with the correct person using two identifiers.  Patient Location: Home  Provider Location: Office/Clinic  I discussed the limitations of evaluation and management by telemedicine. The patient expressed understanding and agreed to proceed.   Review of Systems    Defer to PCP Cardiac Risk Factors include: advanced age (>93mn, >>31women)     Objective:    Today's Vitals   11/05/21 1047 11/05/21 1059  Weight: 122 lb (55.3 kg)   Height: 5' 4.5" (1.638 m)   PainSc: 0-No pain 0-No pain   Body mass index is 20.62 kg/m.     11/05/2021   11:02 AM 11/29/2020   11:42 AM 11/27/2020    2:14 PM 10/30/2020   11:34 AM 08/02/2020    9:16 AM 07/29/2020    2:39 PM 10/25/2019   11:38 AM  Advanced Directives  Does Patient Have a Medical Advance Directive? No No No No No No No  Would patient like information on creating a medical advance directive? No - Patient declined No - Patient declined  No - Patient declined No - Patient declined  No - Patient declined    Current Medications (verified) Outpatient Encounter Medications as of 11/05/2021  Medication Sig   acetaminophen (TYLENOL) 325 MG tablet Take 325-650 mg by mouth every 6 (six) hours as needed (pain.).   aspirin EC 81 MG tablet Take 81 mg by mouth in the morning.   Biotin 10000 MCG TABS Take 10,000 mcg by mouth in the morning.   cetirizine (ZYRTEC) 10 MG tablet Take 10 mg by mouth daily as needed for allergies.   gemfibrozil (LOPID) 600 MG tablet TAKE 1 TABLET BY MOUTH AT BEDTIME.   hydrochlorothiazide (MICROZIDE) 12.5 MG capsule TAKE 1 CAPSULE BY MOUTH EVERY DAY   latanoprost (XALATAN) 0.005 % ophthalmic solution Place 1 drop into both eyes daily.   meclizine (ANTIVERT) 25 MG tablet Take 1  tablet (25 mg total) by mouth 3 (three) times daily as needed for dizziness.   metoprolol tartrate (LOPRESSOR) 50 MG tablet Take 1 tablet (50 mg total) by mouth 2 (two) times daily.   Multiple Vitamin (MULTIVITAMIN WITH MINERALS) TABS tablet Take 1 tablet by mouth daily.   Omega-3 Fatty Acids (FISH OIL) 1000 MG CAPS Take 1,000 mg by mouth in the morning.   omeprazole (PRILOSEC) 20 MG capsule Take 1 capsule (20 mg total) by mouth daily.   simvastatin (ZOCOR) 80 MG tablet TAKE 1 TABLET BY MOUTH EVERYDAY AT BEDTIME   No facility-administered encounter medications on file as of 11/05/2021.    Allergies (verified) Patient has no known allergies.   History: Past Medical History:  Diagnosis Date   Anemia    Anxiety    Arthritis    Bladder tumor    [place]   [place] m[place]   GERD (gastroesophageal reflux disease)    Headache    h/o migraines   Hyperlipidemia    Hypertension    [place]    [place]   [place]   PONV (postoperative nausea and vomiting)    Pre-diabetes    Past Surgical History:  Procedure Laterality Date   APPENDECTOMY     CHOLECYSTECTOMY     COLONOSCOPY  2014   cleared  COLONOSCOPY WITH PROPOFOL N/A 07/03/2019   Procedure: COLONOSCOPY WITH PROPOFOL;  Surgeon: Toledo, Benay Pike, MD;  Location: [facility];  Service: Gastroenterology;  Laterality: N/A;   CYSTOSCOPY WITH BIOPSY N/A 11/29/2020   Procedure: CYSTOSCOPY WITH BLADDER BIOPSY;  Surgeon: [name], MD;  Location: [facility];  Service: Urology;  Laterality: N/A;   MASTECTOMY Right 08/2012   TRANSURETHRAL RESECTION OF BLADDER TUMOR WITH MITOMYCIN-C N/A 08/02/2020   Procedure: TRANSURETHRAL RESECTION OF BLADDER TUMOR WITH GEMCITABINE;  Surgeon: [name], MD;  Location: [facility];  Service: Urology;  Laterality: N/A;   VAGINAL HYSTERECTOMY     Family History  Problem Relation Age of Onset   Breast cancer Sister 64   Brain cancer Daughter     Breast cancer Maternal Aunt 80   Social History   Socioeconomic History   Marital status: Widowed    Spouse name: Not on file   Number of children: 1   Years of education: 12   Highest education level: High school graduate  Occupational History   Occupation: Retired  Tobacco Use   Smoking status: Never    Passive exposure: Never   Smokeless tobacco: Never  Vaping Use   Vaping Use: Never used  Substance and Sexual Activity   Alcohol use: Not Currently   Drug use: Never   Sexual activity: Not Currently    Partners: Male  Other Topics Concern   Not on file  Social History Narrative   Pt lives alone.   Feels safe in her home. Neighbors nearby.   Granddaughter will keep patient after surgery.   Social Determinants of Health   Financial Resource Strain: Low Risk  (11/05/2021)   Overall Financial Resource Strain (CARDIA)    Difficulty of Paying Living Expenses: Not hard at all  Food Insecurity: No Food Insecurity (11/05/2021)   Hunger Vital Sign    Worried About Running Out of Food in the Last Year: Never true    Ran Out of Food in the Last Year: Never true  Transportation Needs: No Transportation Needs (11/05/2021)   PRAPARE - Hydrologist (Medical): No    Lack of Transportation (Non-Medical): No  Physical Activity: Inactive (11/05/2021)   Exercise Vital Sign    Days of Exercise per Week: 0 days    Minutes of Exercise per Session: 0 min  Stress: No Stress Concern Present (11/05/2021)   [place]    Feeling of Stress : Not at all  Social Connections: Moderately Isolated (11/05/2021)   Social Connection and Isolation Panel [NHANES]    Frequency of Communication with Friends and Family: More than three times a week    Frequency of Social Gatherings with Friends and Family: More than three times a week    Attends Religious Services: More than 4 times per year    Active Member of  [employer] or Organizations: No    Attends Archivist Meetings: Never    Marital Status: Widowed    Tobacco Counseling Counseling given: Not Answered   Clinical Intake:  Pre-visit preparation completed: Yes  Pain : No/denies pain Pain Score: 0-No pain     BMI - recorded: 20.62 Nutritional Status: BMI of 19-24  Normal Diabetes: No     Diabetic? No.      Information entered by :: Wyatt Haste, [place] of Daily Living    11/05/2021   11:02 AM 12/13/2020   11:28 AM  In your present state of health, do you have any difficulty performing the following activities:  Hearing? 0 0  Vision? 0 0  Difficulty concentrating or making decisions? 0 0  Walking or climbing stairs? 0 0  Dressing or bathing? 0 0  Doing errands, shopping? 0 0  Preparing Food and eating ? N   Using the Toilet? N   In the past six months, have you accidently leaked urine? N   Do you have problems with loss of bowel control? N   Managing your Medications? N   Managing your Finances? N   Housekeeping or managing your Housekeeping? N     Patient Care Team: Juline Patch, MD as PCP - General (Family Medicine)  Indicate any recent Medical Services you may have received from other than [facility] providers in the past year (date may be approximate).     Assessment:   This is a routine wellness examination for Kaydense.  Hearing/Vision screen Hearing Screening - Comments:: No concerns. Vision Screening - Comments:: Wears glasses.  Dietary issues and exercise activities discussed: Current Exercise Habits: The patient does not participate in regular exercise at present   Goals Addressed   None   Depression Screen    11/05/2021   11:00 AM 09/16/2021    9:07 AM 12/13/2020   11:28 AM 11/25/2020    8:32 AM 10/30/2020   11:29 AM 07/04/2020    1:25 PM 06/20/2020    9:38 AM  PHQ 2/9 Scores  PHQ - 2 Score 0 0 0 0 2 0 0  PHQ- 9 Score 0 0 '6 3 4 '$ 0 0    Fall Risk    11/05/2021   11:02  AM 09/16/2021    9:07 AM 12/13/2020   11:28 AM 11/25/2020    8:31 AM 10/30/2020   11:36 AM  Fall Risk   Falls in the past year? 1 1 0 0 0  Number falls in past yr: 1 0 0 0 0  Injury with Fall? 0 0 0 0 0  Risk for fall due to : History of fall(s) No Fall Risks No Fall Risks  No Fall Risks  Follow up Falls evaluation completed Falls evaluation completed Falls evaluation completed  Falls prevention discussed    FALL RISK PREVENTION PERTAINING TO THE HOME:  Any stairs in or around the home? No  Home free of loose throw rugs in walkways, pet beds, electrical cords, etc? Yes  Adequate lighting in your home to reduce risk of falls? Yes   ASSISTIVE DEVICES UTILIZED TO PREVENT FALLS:  Life alert? Yes  Use of a cane, walker or w/c? No  Grab bars in the bathroom? Yes  Shower chair or bench in shower? No  Elevated toilet seat or a handicapped toilet? Yes   Cognitive Function:        11/05/2021   11:03 AM  6CIT Screen  What Year? 0 points  What month? 0 points  What time? 0 points  Count back from 20 2 points  Months in reverse 0 points  Repeat phrase 0 points  Total Score 2 points    Immunizations Immunization History  Administered Date(s) Administered   Fluad Quad(high Dose 65+) 09/12/2018, 10/03/2019, 11/25/2020   Influenza, High Dose Seasonal PF 01/18/2017   Influenza,inj,Quad PF,6+ Mos 11/05/2014, 11/11/2015, 09/14/2017, 09/16/2021   Influenza-Unspecified 11/05/2014, 11/11/2015, 09/14/2017   Moderna Sars-Covid-2 Vaccination 12/27/2019   PFIZER(Purple Top)SARS-COV-2 Vaccination 05/11/2019, 06/06/2019   Pneumococcal Conjugate-13 11/05/2014   Pneumococcal Polysaccharide-23 03/01/2017  Tdap 07/20/2015    TDAP status: Up to date  Flu Vaccine status: Up to date  Pneumococcal vaccine status: Up to date  Covid-19 vaccine status: Completed vaccines  Qualifies for Shingles Vaccine? Yes   Zostavax completed No   Shingrix Completed?: No.    Education has been provided  regarding the importance of this vaccine. Patient has been advised to call insurance company to determine out of pocket expense if they have not yet received this vaccine. Advised may also receive vaccine at local pharmacy or Health Dept. Verbalized acceptance and understanding.  Screening Tests Health Maintenance  Topic Date Due   COVID-19 Vaccine (4 - Mixed Product risk series) 02/21/2020   Zoster Vaccines- Shingrix (1 of 2) 12/16/2021 (Originally 06/07/1959)   Medicare Annual Wellness (AWV)  12/06/2022   TETANUS/TDAP  07/19/2025   Pneumonia Vaccine 3+ Years old  Completed   INFLUENZA VACCINE  Completed   DEXA SCAN  Completed   HPV VACCINES  Aged Out    Health Maintenance  Health Maintenance Due  Topic Date Due   COVID-19 Vaccine (4 - Mixed Product risk series) 02/21/2020    Colorectal cancer screening: No longer required.   Mammogram status: No longer required due to age.  Bone Density status: Completed 03/08/2017. Results reflect: Bone density results: OSTEOPENIA. Repeat every 2 years.  Lung Cancer Screening: (Low Dose CT Chest recommended if Age 54-80 years, 30 pack-year currently smoking OR have quit w/in 15years.) does not qualify.   Additional Screening:  Hepatitis C Screening: does not qualify;   Vision Screening: Recommended annual ophthalmology exams for early detection of glaucoma and other disorders of the eye. Is the patient up to date with their annual eye exam?  Yes  Who is the provider or what is the name of the office in which the patient attends annual eye exams? [facility] in [place]: Recommended annual dental exams for proper oral hygiene  Community Resource Referral / Chronic Care Management: CRR required this visit?  No   CCM required this visit?  No      Plan:     I have personally reviewed and noted the following in the patient's chart:   Medical and social history Use of alcohol, tobacco or illicit drugs  Current  medications and supplements including opioid prescriptions. Patient is not currently taking opioid prescriptions. Functional ability and status Nutritional status Physical activity Advanced directives List of other physicians Hospitalizations, surgeries, and ER visits in previous 12 months Vitals Screenings to include cognitive, depression, and falls Referrals and appointments  In addition, I have reviewed and discussed with patient certain preventive protocols, quality metrics, and best practice recommendations. A written personalized care plan for preventive services as well as general preventive health recommendations were provided to patient.     Clista Bernhardt, [place]   11/05/2021    Ms. Leija , Thank you for taking time to come for your Medicare Wellness Visit. I appreciate your ongoing commitment to your health goals. Please review the following plan we discussed and let me know if I can assist you in the future.   These are the goals we discussed:  Goals      DIET - INCREASE WATER INTAKE     Recommend drinking 6-8 glasses of water per day          This is a list of the screening recommended for you and due dates:  Health Maintenance  Topic Date Due   COVID-19 Vaccine (  4 - Mixed Product risk series) 02/21/2020   Zoster (Shingles) Vaccine (1 of 2) 12/16/2021*   Medicare Annual Wellness Visit  12/06/2022   Tetanus Vaccine  07/19/2025   Pneumonia Vaccine  Completed   Flu Shot  Completed   DEXA scan (bone density measurement)  Completed   HPV Vaccine  Aged Out  *Topic was postponed. The date shown is not the original due date.     Nurse Notes: None.

## 2021-11-06 ENCOUNTER — Encounter: Payer: Self-pay | Admitting: Urology

## 2021-11-06 ENCOUNTER — Ambulatory Visit: Payer: Medicare HMO | Admitting: Urology

## 2021-11-06 VITALS — BP 136/81 | HR 67 | Ht 64.5 in | Wt 122.0 lb

## 2021-11-06 DIAGNOSIS — C679 Malignant neoplasm of bladder, unspecified: Secondary | ICD-10-CM

## 2021-11-06 DIAGNOSIS — R310 Gross hematuria: Secondary | ICD-10-CM | POA: Diagnosis not present

## 2021-11-06 DIAGNOSIS — Z8551 Personal history of malignant neoplasm of bladder: Secondary | ICD-10-CM | POA: Diagnosis not present

## 2021-11-06 MED ORDER — CEPHALEXIN 250 MG PO CAPS
500.0000 mg | ORAL_CAPSULE | Freq: Once | ORAL | Status: AC
  Administered 2021-11-06: 500 mg via ORAL

## 2021-11-06 MED ORDER — CEPHALEXIN 250 MG PO CAPS: 500.0000 mg | ORAL_CAPSULE | Freq: Once | ORAL | Status: DC

## 2021-11-06 NOTE — Progress Notes (Signed)
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  Path HG Ta  -Recurrence 11/2020: Multiple small papillary tumors biopsied and fulgurated, HG Ta -Induction BCG completed 02/2021 -She deferred mBCG   After informed consent and discussion of the procedure and its risks, Amber Galloway was positioned and prepped in the standard fashion. Cystoscopy was performed with a flexible cystoscope. The urethra, bladder neck and entire bladder was visualized in a standard fashion.  Bladder mucosa grossly normal throughout, no abnormalities on retroflexion.   Findings: Normal cystoscopy   Assessment and Plan: RTC 4 months cystoscopy  [name], MD 11/06/2021

## 2021-11-20 ENCOUNTER — Other Ambulatory Visit: Payer: Self-pay | Admitting: Family Medicine

## 2021-11-20 DIAGNOSIS — J301 Allergic rhinitis due to pollen: Secondary | ICD-10-CM

## 2021-11-20 NOTE — Telephone Encounter (Signed)
Unable to refill per protocol, Rx expired. Medication was discontinued 07/03/21, completed course. Will refuse.  Requested Prescriptions  Pending Prescriptions Disp Refills   montelukast (SINGULAIR) 10 MG tablet [Pharmacy Med Name: MONTELUKAST SOD 10 MG TABLET] 90 tablet 1    Sig: TAKE 1 TABLET BY MOUTH EVERYDAY AT BEDTIME     Pulmonology:  Leukotriene Inhibitors Passed - 11/20/2021  2:35 AM      Passed - Valid encounter within last 12 months    Recent Outpatient Visits           2 months ago Essential hypertension   [facility] [place], Carver, MD   8 months ago Acute non-recurrent maxillary sinusitis   [facility] [place], [place], MD   11 months ago Acute non-recurrent maxillary sinusitis   [facility], Jesse Sans, MD   12 months ago Need for immunization against influenza   [facility] [place], Deanna C, MD   1 year ago Cystitis   [facility] [place], [place], MD       Future Appointments             In 3 months Juline Patch, MD [facility] at [facility], [facility]

## 2021-12-11 ENCOUNTER — Other Ambulatory Visit: Payer: Self-pay

## 2021-12-11 ENCOUNTER — Telehealth: Payer: Self-pay

## 2021-12-11 ENCOUNTER — Telehealth: Payer: Self-pay | Admitting: Family Medicine

## 2021-12-11 DIAGNOSIS — Z1231 Encounter for screening mammogram for malignant neoplasm of breast: Secondary | ICD-10-CM

## 2021-12-11 NOTE — Telephone Encounter (Signed)
Called pt with mammo in [place] appt- 02/10/22 @ 10:00

## 2021-12-11 NOTE — Telephone Encounter (Signed)
Pt is calling Baxter Flattery to she if she can place an order for her to receive a mammogram any day but Wednesday. Please advise CB- [phone]

## 2021-12-18 DIAGNOSIS — B079 Viral wart, unspecified: Secondary | ICD-10-CM | POA: Diagnosis not present

## 2021-12-18 DIAGNOSIS — L570 Actinic keratosis: Secondary | ICD-10-CM | POA: Diagnosis not present

## 2021-12-18 DIAGNOSIS — D485 Neoplasm of uncertain behavior of skin: Secondary | ICD-10-CM | POA: Diagnosis not present

## 2021-12-18 DIAGNOSIS — L298 Other pruritus: Secondary | ICD-10-CM | POA: Diagnosis not present

## 2021-12-18 DIAGNOSIS — L821 Other seborrheic keratosis: Secondary | ICD-10-CM | POA: Diagnosis not present

## 2022-01-15 DIAGNOSIS — H2513 Age-related nuclear cataract, bilateral: Secondary | ICD-10-CM | POA: Diagnosis not present

## 2022-01-15 DIAGNOSIS — H4010X1 Unspecified open-angle glaucoma, mild stage: Secondary | ICD-10-CM | POA: Diagnosis not present

## 2022-02-10 ENCOUNTER — Ambulatory Visit
Admission: RE | Admit: 2022-02-10 | Discharge: 2022-02-10 | Disposition: A | Discharge status: Home / Self Care | Payer: Medicare HMO | Source: Ambulatory Visit | Attending: Family Medicine | Admitting: Family Medicine

## 2022-02-10 DIAGNOSIS — Z1231 Encounter for screening mammogram for malignant neoplasm of breast: Secondary | ICD-10-CM | POA: Insufficient documentation

## 2022-02-16 ENCOUNTER — Telehealth: Payer: Self-pay

## 2022-02-16 ENCOUNTER — Telehealth: Payer: Self-pay | Admitting: Family Medicine

## 2022-02-16 NOTE — Telephone Encounter (Signed)
Copied from [place] [id number]. Topic: General - Other >> Feb 16, 2022 12:19 PM Ja-Kwan M wrote: Reason for CRM:  Pt called for mammogram results.Cb# [phone]

## 2022-02-16 NOTE — Telephone Encounter (Signed)
Called and left message with negative/ normal mammo

## 2022-02-20 ENCOUNTER — Other Ambulatory Visit: Payer: Self-pay | Admitting: Family Medicine

## 2022-02-20 DIAGNOSIS — I10 Essential (primary) hypertension: Secondary | ICD-10-CM

## 2022-03-09 ENCOUNTER — Other Ambulatory Visit: Payer: Medicare HMO | Admitting: Urology

## 2022-03-17 ENCOUNTER — Ambulatory Visit (INDEPENDENT_AMBULATORY_CARE_PROVIDER_SITE_OTHER): Payer: Medicare HMO | Admitting: Family Medicine

## 2022-03-17 ENCOUNTER — Encounter: Payer: Self-pay | Admitting: Family Medicine

## 2022-03-17 VITALS — BP 124/60 | HR 75 | Ht 64.5 in | Wt 128.0 lb

## 2022-03-17 DIAGNOSIS — E782 Mixed hyperlipidemia: Secondary | ICD-10-CM | POA: Diagnosis not present

## 2022-03-17 DIAGNOSIS — R151 Fecal smearing: Secondary | ICD-10-CM

## 2022-03-17 DIAGNOSIS — K219 Gastro-esophageal reflux disease without esophagitis: Secondary | ICD-10-CM

## 2022-03-17 DIAGNOSIS — I10 Essential (primary) hypertension: Secondary | ICD-10-CM | POA: Diagnosis not present

## 2022-03-17 DIAGNOSIS — K921 Melena: Secondary | ICD-10-CM | POA: Diagnosis not present

## 2022-03-17 MED ORDER — METOPROLOL TARTRATE 50 MG PO TABS: 50.0000 mg | ORAL_TABLET | Freq: Two times a day (BID) | ORAL | 0 refills | Status: DC

## 2022-03-17 MED ORDER — OMEPRAZOLE 20 MG PO CPDR: 20.0000 mg | DELAYED_RELEASE_CAPSULE | Freq: Every day | ORAL | 1 refills | Status: DC

## 2022-03-17 MED ORDER — HYDROCHLOROTHIAZIDE 12.5 MG PO CAPS: ORAL_CAPSULE | ORAL | 1 refills | Status: DC

## 2022-03-17 MED ORDER — SIMVASTATIN 80 MG PO TABS: ORAL_TABLET | ORAL | 1 refills | Status: DC

## 2022-03-17 NOTE — Progress Notes (Signed)
Date:  03/17/2022   Name:  Amber Galloway   DOB:  [birth year]   MRN:  [id number]   Chief Complaint: Hypertension, Gastroesophageal Reflux, and Hyperlipidemia  Hypertension This is a chronic problem. The current episode started more than 1 year ago. The problem has been gradually improving since onset. The problem is controlled. Pertinent negatives include no chest pain, headaches, orthopnea, palpitations, PND or shortness of breath. There are no associated agents to hypertension. Past treatments include diuretics and beta blockers. There are no compliance problems.  There is no history of CAD/MI or CVA. There is no history of chronic renal disease, a hypertension causing med or renovascular disease.  Gastroesophageal Reflux She reports no abdominal pain, no chest pain, no coughing, no dysphagia, no heartburn, no nausea, no sore throat or no wheezing. This is a chronic problem. The current episode started more than 1 year ago. The problem has been gradually improving. The symptoms are aggravated by certain foods. Pertinent negatives include no anemia or fatigue. She has tried a PPI for the symptoms. The treatment provided moderate relief.  Hyperlipidemia This is a chronic problem. The current episode started more than 1 year ago. The problem is controlled. Recent lipid tests were reviewed and are normal. She has no history of chronic renal disease or diabetes. Pertinent negatives include no chest pain, focal sensory loss, myalgias or shortness of breath. The current treatment provides moderate improvement of lipids. There are no compliance problems.  Risk factors for coronary artery disease include dyslipidemia and hypertension.  Rectal Bleeding  The current episode started more than 2 weeks ago (episodic/off and on for 1 year). The problem occurs occasionally. The patient is experiencing no pain. The stool is described as streaked with blood. There was no prior unsuccessful therapy. Pertinent  negatives include no fever, no abdominal pain, no diarrhea, no nausea, no hematuria, no vaginal discharge, no chest pain, no headaches, no coughing and no rash.    Lab Results  Component Value Date   NA 140 09/16/2021   K 4.4 09/16/2021   CO2 26 09/16/2021   GLUCOSE 119 (H) 09/16/2021   BUN 15 09/16/2021   CREATININE 0.75 09/16/2021   CALCIUM 10.3 09/16/2021   EGFR 79 09/16/2021   GFRNONAA >60 11/28/2020   Lab Results  Component Value Date   CHOL 192 09/16/2021   HDL 58 09/16/2021   LDLCALC 106 (H) 09/16/2021   TRIG 162 (H) 09/16/2021   CHOLHDL 3.7 03/01/2017   No results found for: "TSH" Lab Results  Component Value Date   HGBA1C 6.8 (H) 05/23/2020   Lab Results  Component Value Date   WBC 8.0 11/28/2020   HGB 13.2 11/28/2020   HCT 38.6 11/28/2020   MCV 87.1 11/28/2020   PLT 268 11/28/2020   Lab Results  Component Value Date   ALT 10 09/16/2021   AST 21 09/16/2021   ALKPHOS 94 09/16/2021   BILITOT 0.7 09/16/2021   No results found for: "25OHVITD2", "25OHVITD3", "VD25OH"   Review of Systems  Constitutional: Negative.  Negative for chills, fatigue, fever and unexpected weight change.  HENT:  Negative for congestion, ear discharge, ear pain, rhinorrhea, sinus pressure, sneezing and sore throat.   Respiratory:  Negative for cough, shortness of breath, wheezing and stridor.   Cardiovascular:  Negative for chest pain, palpitations, orthopnea and PND.  Gastrointestinal:  Positive for hematochezia. Negative for abdominal pain, blood in stool, constipation, diarrhea, dysphagia, heartburn and nausea.  Genitourinary:  Negative for dysuria, flank  pain, frequency, hematuria, urgency and vaginal discharge.  Musculoskeletal:  Negative for arthralgias, back pain and myalgias.  Skin:  Negative for rash.  Neurological:  Negative for dizziness, weakness and headaches.  Hematological:  Negative for adenopathy. Does not bruise/bleed easily.  Psychiatric/Behavioral:  Negative for  dysphoric mood. The patient is not nervous/anxious.     Patient Active Problem List   Diagnosis Date Noted   Primary osteoarthritis of left knee 06/20/2020   Pain and swelling of left lower leg 06/20/2020   Pelvic pain 10/25/2019   Acute anxiety 08/10/2017   Overweight (BMI 25.0-29.9) 03/01/2017   Dizziness 06/05/2016   Essential hypertension 06/06/2015   Mixed hyperlipidemia 06/06/2015   Gastroesophageal reflux disease without esophagitis 06/06/2015   History of melanoma in situ 05/21/2014    No Known Allergies  Past Surgical History:  Procedure Laterality Date   APPENDECTOMY     CHOLECYSTECTOMY     COLONOSCOPY  2014   cleared   COLONOSCOPY WITH PROPOFOL N/A 07/03/2019   Procedure: COLONOSCOPY WITH PROPOFOL;  Surgeon: Toledo, Benay Pike, MD;  Location: [facility];  Service: Gastroenterology;  Laterality: N/A;   CYSTOSCOPY WITH BIOPSY N/A 11/29/2020   Procedure: CYSTOSCOPY WITH BLADDER BIOPSY;  Surgeon: [name], MD;  Location: [facility];  Service: Urology;  Laterality: N/A;   MASTECTOMY Right 08/2012   TRANSURETHRAL RESECTION OF BLADDER TUMOR WITH MITOMYCIN-C N/A 08/02/2020   Procedure: TRANSURETHRAL RESECTION OF BLADDER TUMOR WITH GEMCITABINE;  Surgeon: [name], MD;  Location: [facility];  Service: Urology;  Laterality: N/A;   VAGINAL HYSTERECTOMY      Social History   Tobacco Use   Smoking status: Never    Passive exposure: Never   Smokeless tobacco: Never  Vaping Use   Vaping Use: Never used  Substance Use Topics   Alcohol use: Not Currently   Drug use: Never     Medication list has been reviewed and updated.  Current Meds  Medication Sig   acetaminophen (TYLENOL) 325 MG tablet Take 325-650 mg by mouth every 6 (six) hours as needed (pain.).   aspirin EC 81 MG tablet Take 81 mg by mouth in the morning.   Biotin 10000 MCG TABS Take 10,000 mcg by mouth in the morning.   hydrochlorothiazide (MICROZIDE) 12.5 MG capsule TAKE 1 CAPSULE BY MOUTH EVERY  DAY   latanoprost (XALATAN) 0.005 % ophthalmic solution Place 1 drop into both eyes daily.   meclizine (ANTIVERT) 25 MG tablet Take 1 tablet (25 mg total) by mouth 3 (three) times daily as needed for dizziness.   metoprolol tartrate (LOPRESSOR) 50 MG tablet TAKE 1 TABLET BY MOUTH TWICE A DAY   omeprazole (PRILOSEC) 20 MG capsule Take 1 capsule (20 mg total) by mouth daily.   simvastatin (ZOCOR) 80 MG tablet TAKE 1 TABLET BY MOUTH EVERYDAY AT BEDTIME       03/17/2022    9:31 AM 09/16/2021    9:07 AM 12/13/2020   11:28 AM 11/25/2020    8:32 AM  GAD 7 : Generalized Anxiety Score  Nervous, Anxious, on Edge 0 0 0 0  Control/stop worrying 0 0 0 0  Worry too much - different things 0 0 0 0  Trouble relaxing 0 0 0 0  Restless 0 0 0 0  Easily annoyed or irritable 0 0 0 0  Afraid - awful might happen 0 0 0 0  Total GAD 7 Score 0 0 0 0  Anxiety Difficulty Not difficult at all Not difficult at all  Not difficult at all        03/17/2022    9:31 AM 11/05/2021   11:00 AM 09/16/2021    9:07 AM  Depression screen PHQ 2/9  Decreased Interest 0 0 0  Down, Depressed, Hopeless 0 0 0  PHQ - 2 Score 0 0 0  Altered sleeping 0 0 0  Tired, decreased energy 0 0 0  Change in appetite 0 0 0  Feeling bad or failure about yourself  0 0 0  Trouble concentrating 0 0 0  Moving slowly or fidgety/restless 0 0 0  Suicidal thoughts 0 0 0  PHQ-9 Score 0 0 0  Difficult doing work/chores Not difficult at all Not difficult at all Not difficult at all    BP Readings from Last 3 Encounters:  03/17/22 124/60  11/06/21 136/81  09/16/21 120/84    Physical Exam Vitals and nursing note reviewed. Exam conducted with a chaperone present.  Constitutional:      General: She is not in acute distress.    Appearance: She is not diaphoretic.  HENT:     Head: Normocephalic and atraumatic.     Right Ear: Tympanic membrane and external ear normal.     Left Ear: Tympanic membrane and external ear normal.     Nose: Nose  normal.     Mouth/Throat:     Mouth: Mucous membranes are moist.  Eyes:     General:        Right eye: No discharge.        Left eye: No discharge.     Conjunctiva/sclera: Conjunctivae normal.     Pupils: Pupils are equal, round, and reactive to light.  Neck:     Thyroid: No thyromegaly.     Vascular: No JVD.  Cardiovascular:     Rate and Rhythm: Normal rate and regular rhythm.     Heart sounds: Normal heart sounds. No murmur heard.    No friction rub. No gallop.  Pulmonary:     Effort: Pulmonary effort is normal.     Breath sounds: Normal breath sounds. No wheezing, rhonchi or rales.  Abdominal:     General: Bowel sounds are normal.     Palpations: Abdomen is soft. There is no mass.     Tenderness: There is no abdominal tenderness. There is no guarding.  Musculoskeletal:        General: Normal range of motion.     Cervical back: Normal range of motion and neck supple.  Lymphadenopathy:     Cervical: No cervical adenopathy.  Skin:    General: Skin is warm and dry.  Neurological:     Mental Status: She is alert.     Deep Tendon Reflexes: Reflexes are normal and symmetric.     Wt Readings from Last 3 Encounters:  03/17/22 128 lb (58.1 kg)  11/06/21 122 lb (55.3 kg)  11/05/21 122 lb (55.3 kg)    BP 124/60   Pulse 75   Ht 5' 4.5" (1.638 m)   Wt 128 lb (58.1 kg)   SpO2 96%   BMI 21.63 kg/m   Assessment and Plan: 1. Essential hypertension Chronic.  Controlled.  Stable.  Blood pressure today is 124/60.  Asymptomatic.  Tolerating medications well.  Continue hydrochlorothiazide 12.5 mg once a day and metoprolol 50 mg twice a day.  Will check CMP for current status of electrolytes and GFR. - hydrochlorothiazide (MICROZIDE) 12.5 MG capsule; TAKE 1 CAPSULE BY MOUTH EVERY DAY  Dispense: 90 capsule; Refill:  1 - metoprolol tartrate (LOPRESSOR) 50 MG tablet; Take 1 tablet (50 mg total) by mouth 2 (two) times daily.  Dispense: 180 tablet; Refill: 0 - Comprehensive Metabolic  Panel (CMET)  2. Gastroesophageal reflux disease without esophagitis Chronic.  Controlled.  Stable.  Continue omeprazole 20 mg once a day. - omeprazole (PRILOSEC) 20 MG capsule; Take 1 capsule (20 mg total) by mouth daily.  Dispense: 90 capsule; Refill: 1  3. Mixed hyperlipidemia Chronic.  Controlled.  Stable.  Continue simvastatin 80 mg once at night.  Will recheck lipids in 6 months. - simvastatin (ZOCOR) 80 MG tablet; TAKE 1 TABLET BY MOUTH EVERYDAY AT BEDTIME  Dispense: 90 tablet; Refill: 1  4. Fecal smearing Patient is having some fecal incontinence and I have suggested including more fiber in her diet.  In the meantime we will make a referral to gastroenterology because patient is also noted some blood on occasions that is dark red. - Ambulatory referral to Gastroenterology  5. Hematochezia New onset of about a year with episodic rectal bleeding with bowel movements.  It is dark red in appearance and not associated with pain.  Will refer to her gastroenterologist for follow-up. - Ambulatory referral to Gastroenterology     Otilio Miu, MD

## 2022-03-18 ENCOUNTER — Other Ambulatory Visit: Payer: Medicare HMO | Admitting: Urology

## 2022-03-18 ENCOUNTER — Encounter: Payer: Self-pay | Admitting: Urology

## 2022-03-18 ENCOUNTER — Ambulatory Visit: Payer: Medicare HMO | Admitting: Urology

## 2022-03-18 VITALS — BP 154/72 | HR 69 | Ht 64.0 in | Wt 129.0 lb

## 2022-03-18 DIAGNOSIS — Z8551 Personal history of malignant neoplasm of bladder: Secondary | ICD-10-CM

## 2022-03-18 LAB — COMPREHENSIVE METABOLIC PANEL
ALT: 12 IU/L (ref 0–32)
AST: 27 IU/L (ref 0–40)
Albumin/Globulin Ratio: 1.8 (ref 1.2–2.2)
Albumin: 4.4 g/dL (ref 3.7–4.7)
Alkaline Phosphatase: 84 IU/L (ref 44–121)
BUN/Creatinine Ratio: 25 (ref 12–28)
BUN: 19 mg/dL (ref 8–27)
Bilirubin Total: 0.8 mg/dL (ref 0.0–1.2)
CO2: 26 mmol/L (ref 20–29)
Calcium: 10.2 mg/dL (ref 8.7–10.3)
Chloride: 99 mmol/L (ref 96–106)
Creatinine, Ser: 0.76 mg/dL (ref 0.57–1.00)
Globulin, Total: 2.5 g/dL (ref 1.5–4.5)
Glucose: 102 mg/dL — ABNORMAL HIGH (ref 70–99)
Potassium: 4.4 mmol/L (ref 3.5–5.2)
Sodium: 140 mmol/L (ref 134–144)
Total Protein: 6.9 g/dL (ref 6.0–8.5)
eGFR: 78 mL/min/{1.73_m2} (ref >59)

## 2022-03-18 NOTE — Progress Notes (Signed)
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  Path HG Ta  -Recurrence 11/2020: Multiple small papillary tumors biopsied and fulgurated, HG Ta -Induction BCG completed 02/2021 -She deferred mBCG   After informed consent and discussion of the procedure and its risks, Amber Galloway was positioned and prepped in the standard fashion. Cystoscopy was performed with a flexible cystoscope. The urethra, bladder neck and entire bladder was visualized in a standard fashion.  Bladder mucosa grossly normal throughout, no abnormalities on retroflexion.   Findings: Normal cystoscopy   Assessment and Plan: RTC 4 months cystoscopy  Amber Madrid, MD 03/18/2022

## 2022-03-24 DIAGNOSIS — H2513 Age-related nuclear cataract, bilateral: Secondary | ICD-10-CM | POA: Diagnosis not present

## 2022-03-24 DIAGNOSIS — H1013 Acute atopic conjunctivitis, bilateral: Secondary | ICD-10-CM | POA: Diagnosis not present

## 2022-03-24 DIAGNOSIS — D23122 Other benign neoplasm of skin of left lower eyelid, including canthus: Secondary | ICD-10-CM | POA: Diagnosis not present

## 2022-04-30 DIAGNOSIS — D23122 Other benign neoplasm of skin of left lower eyelid, including canthus: Secondary | ICD-10-CM | POA: Diagnosis not present

## 2022-04-30 DIAGNOSIS — D23112 Other benign neoplasm of skin of right lower eyelid, including canthus: Secondary | ICD-10-CM | POA: Diagnosis not present

## 2022-05-05 DIAGNOSIS — Z8719 Personal history of other diseases of the digestive system: Secondary | ICD-10-CM | POA: Diagnosis not present

## 2022-05-05 DIAGNOSIS — K649 Unspecified hemorrhoids: Secondary | ICD-10-CM | POA: Diagnosis not present

## 2022-05-07 DIAGNOSIS — D23122 Other benign neoplasm of skin of left lower eyelid, including canthus: Secondary | ICD-10-CM | POA: Diagnosis not present

## 2022-05-07 DIAGNOSIS — D23112 Other benign neoplasm of skin of right lower eyelid, including canthus: Secondary | ICD-10-CM | POA: Diagnosis not present

## 2022-05-11 DIAGNOSIS — H2513 Age-related nuclear cataract, bilateral: Secondary | ICD-10-CM | POA: Diagnosis not present

## 2022-05-11 DIAGNOSIS — H4010X1 Unspecified open-angle glaucoma, mild stage: Secondary | ICD-10-CM | POA: Diagnosis not present

## 2022-06-11 DIAGNOSIS — S42212A Unspecified displaced fracture of surgical neck of left humerus, initial encounter for closed fracture: Secondary | ICD-10-CM | POA: Diagnosis not present

## 2022-06-11 DIAGNOSIS — L821 Other seborrheic keratosis: Secondary | ICD-10-CM | POA: Diagnosis not present

## 2022-06-11 DIAGNOSIS — I10 Essential (primary) hypertension: Secondary | ICD-10-CM | POA: Diagnosis not present

## 2022-06-11 DIAGNOSIS — L570 Actinic keratosis: Secondary | ICD-10-CM | POA: Diagnosis not present

## 2022-06-11 DIAGNOSIS — W19XXXA Unspecified fall, initial encounter: Secondary | ICD-10-CM | POA: Diagnosis not present

## 2022-06-11 DIAGNOSIS — L02212 Cutaneous abscess of back [any part, except buttock]: Secondary | ICD-10-CM | POA: Diagnosis not present

## 2022-06-11 DIAGNOSIS — S42295A Other nondisplaced fracture of upper end of left humerus, initial encounter for closed fracture: Secondary | ICD-10-CM | POA: Diagnosis not present

## 2022-06-11 DIAGNOSIS — L298 Other pruritus: Secondary | ICD-10-CM | POA: Diagnosis not present

## 2022-06-11 DIAGNOSIS — K219 Gastro-esophageal reflux disease without esophagitis: Secondary | ICD-10-CM | POA: Diagnosis not present

## 2022-06-11 DIAGNOSIS — S42202A Unspecified fracture of upper end of left humerus, initial encounter for closed fracture: Secondary | ICD-10-CM | POA: Diagnosis not present

## 2022-06-11 DIAGNOSIS — W228XXA Striking against or struck by other objects, initial encounter: Secondary | ICD-10-CM | POA: Diagnosis not present

## 2022-06-11 DIAGNOSIS — Z79899 Other long term (current) drug therapy: Secondary | ICD-10-CM | POA: Diagnosis not present

## 2022-06-11 DIAGNOSIS — Z7982 Long term (current) use of aspirin: Secondary | ICD-10-CM | POA: Diagnosis not present

## 2022-06-11 DIAGNOSIS — S0101XA Laceration without foreign body of scalp, initial encounter: Secondary | ICD-10-CM | POA: Diagnosis not present

## 2022-06-11 DIAGNOSIS — M47812 Spondylosis without myelopathy or radiculopathy, cervical region: Secondary | ICD-10-CM | POA: Diagnosis not present

## 2022-06-11 DIAGNOSIS — R519 Headache, unspecified: Secondary | ICD-10-CM | POA: Diagnosis not present

## 2022-06-11 DIAGNOSIS — M25512 Pain in left shoulder: Secondary | ICD-10-CM | POA: Diagnosis not present

## 2022-06-11 DIAGNOSIS — E785 Hyperlipidemia, unspecified: Secondary | ICD-10-CM | POA: Diagnosis not present

## 2022-06-12 ENCOUNTER — Telehealth: Payer: Self-pay

## 2022-06-12 NOTE — Transitions of Care (Post Inpatient/ED Visit) (Signed)
   06/12/2022  Name: Amber Galloway MRN: [id number] DOB: 26-Aug-1940  Today's TOC FU Call Status: Today's TOC FU Call Status:: Successful TOC FU Call Competed TOC FU Call Complete Date: 06/12/22  Transition Care Management Follow-up Telephone Call Date of Discharge: 06/11/22 Discharge Facility: Other ([facility]) Name of Other ([facility]) [facility] Facility: [facility] Type of Discharge: Emergency Department Reason for ED Visit: Other: (fall/ broken arm) How have you been since you were released from the hospital?: Same Any questions or concerns?: No  Items Reviewed: Did you receive and understand the discharge instructions provided?: Yes Medications obtained,verified, and reconciled?: Yes (Medications Reviewed) Any new allergies since your discharge?: No Dietary orders reviewed?: NA Do you have support at home?: Yes People in Home: significant other Name of Support/Comfort Primary Source: Bobby  Medications Reviewed Today: Medications Reviewed Today     Reviewed by Everitt Amber (Medical Assistant) on 06/12/22 at 1700  Med List Status: <None>   Medication Order Taking? Sig Documenting Provider Last Dose Status Informant  acetaminophen (TYLENOL) 325 MG tablet [id number] Yes Take 325-650 mg by mouth every 6 (six) hours as needed (pain.). [provider] Taking Active Self  aspirin EC 81 MG tablet [id number] Yes Take 81 mg by mouth in the morning. [provider] Taking Active Self  Biotin 01751 MCG TABS [id number] Yes Take 10,000 mcg by mouth in the morning. [provider] Taking Active Self  hydrochlorothiazide (MICROZIDE) 12.5 MG capsule [id number] Yes TAKE 1 CAPSULE BY MOUTH EVERY DAY Duanne Limerick, MD Taking Active   latanoprost (XALATAN) 0.005 % ophthalmic solution [id number] Yes Place 1 drop into both eyes daily. [provider] Taking Active Self  meclizine (ANTIVERT) 25 MG tablet [id number] Yes Take 1 tablet (25 mg total) by mouth  3 (three) times daily as needed for dizziness. Duanne Limerick, MD Taking Active Self  metoprolol tartrate (LOPRESSOR) 50 MG tablet [id number] Yes Take 1 tablet (50 mg total) by mouth 2 (two) times daily. Duanne Limerick, MD Taking Active   omeprazole (PRILOSEC) 20 MG capsule [id number] Yes Take 1 capsule (20 mg total) by mouth daily. Duanne Limerick, MD Taking Active   oxyCODONE (OXY IR/ROXICODONE) 5 MG immediate release tablet [id number] Yes Take by mouth. [provider] Taking Active   simvastatin (ZOCOR) 80 MG tablet [id number] Yes TAKE 1 TABLET BY MOUTH EVERYDAY AT BEDTIME Duanne Limerick, MD Taking Active             Home Care and Equipment/Supplies: Were Home Health Services Ordered?: NA Any new equipment or medical supplies ordered?: NA  Functional Questionnaire: Do you need assistance with bathing/showering or dressing?: No Do you need assistance with meal preparation?: No Do you need assistance with eating?: No Do you have difficulty maintaining continence: No Do you need assistance with getting out of bed/getting out of a chair/moving?: No Do you have difficulty managing or taking your medications?: No  Follow up appointments reviewed: PCP Follow-up appointment confirmed?: NA Specialist Hospital Follow-up appointment confirmed?: No Reason Specialist Follow-Up Not Confirmed: Patient has Specialist Provider Number and will Call for Appointment (I am going to assist pt on Monday with the appt) Do you need transportation to your follow-up appointment?: No Do you understand care options if your condition(s) worsen?: Yes-patient verbalized understanding    SIGNATURE Arthur Holms

## 2022-06-15 ENCOUNTER — Telehealth: Payer: Self-pay

## 2022-06-15 NOTE — Telephone Encounter (Signed)
Called pt and gave her the appt. For [facility] ortho- 6/18 at [phone]nd floor. [phone]. Also gave pt info to call ER for refill on pain med, advised by ortho clinic, but she said she didn't need anymore. Taking tylenol and alternating with Aleve

## 2022-06-22 ENCOUNTER — Ambulatory Visit (INDEPENDENT_AMBULATORY_CARE_PROVIDER_SITE_OTHER): Payer: Medicare HMO | Admitting: Family Medicine

## 2022-06-22 ENCOUNTER — Encounter: Payer: Self-pay | Admitting: Family Medicine

## 2022-06-22 VITALS — BP 128/70 | HR 63 | Ht 64.0 in | Wt 128.0 lb

## 2022-06-22 DIAGNOSIS — S0101XD Laceration without foreign body of scalp, subsequent encounter: Secondary | ICD-10-CM

## 2022-06-22 DIAGNOSIS — S42352S Displaced comminuted fracture of shaft of humerus, left arm, sequela: Secondary | ICD-10-CM

## 2022-06-22 DIAGNOSIS — S20212D Contusion of left front wall of thorax, subsequent encounter: Secondary | ICD-10-CM | POA: Diagnosis not present
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
## 2022-06-30 DIAGNOSIS — M954 Acquired deformity of chest and rib: Secondary | ICD-10-CM | POA: Diagnosis not present

## 2022-06-30 DIAGNOSIS — S42212A Unspecified displaced fracture of surgical neck of left humerus, initial encounter for closed fracture: Secondary | ICD-10-CM | POA: Diagnosis not present

## 2022-06-30 DIAGNOSIS — X58XXXD Exposure to other specified factors, subsequent encounter: Secondary | ICD-10-CM | POA: Diagnosis not present

## 2022-06-30 DIAGNOSIS — S42295G Other nondisplaced fracture of upper end of left humerus, subsequent encounter for fracture with delayed healing: Secondary | ICD-10-CM | POA: Diagnosis not present

## 2022-06-30 DIAGNOSIS — S42202A Unspecified fracture of upper end of left humerus, initial encounter for closed fracture: Secondary | ICD-10-CM | POA: Diagnosis not present

## 2022-07-16 IMAGING — MG DIGITAL SCREENING UNILAT LEFT W/ TOMO W/ CAD
4 series · 4 of 12 positions shown · non-contrast
Comparison: Previous exam(s).

CLINICAL DATA: Screening.

EXAM:
DIGITAL SCREENING UNILATERAL LEFT MAMMOGRAM WITH CAD AND TOMO

[L CC synth-2D]
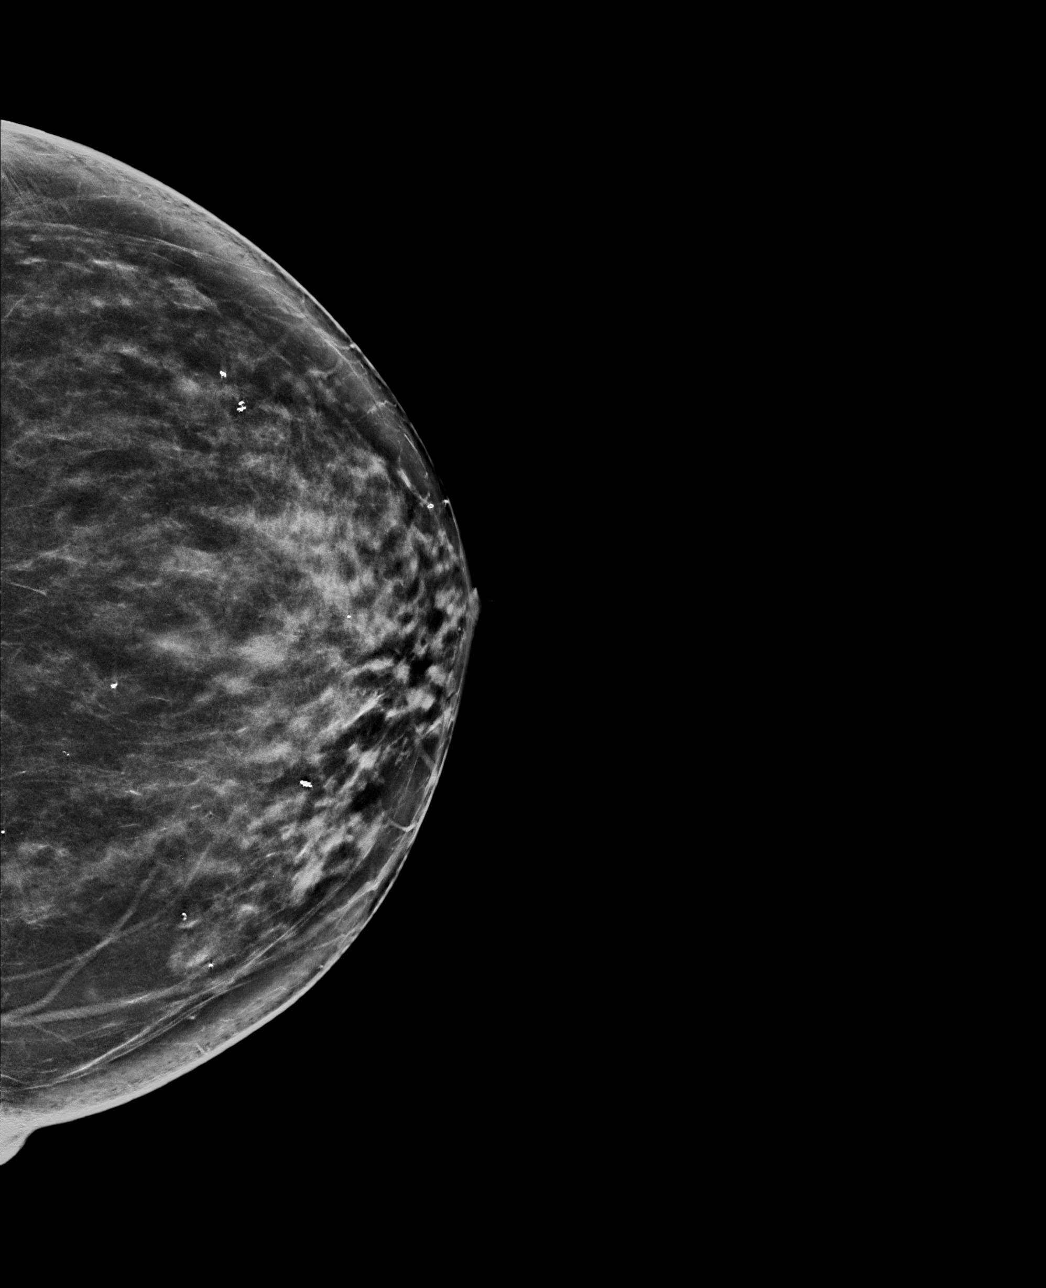

[L MLO synth-2D]
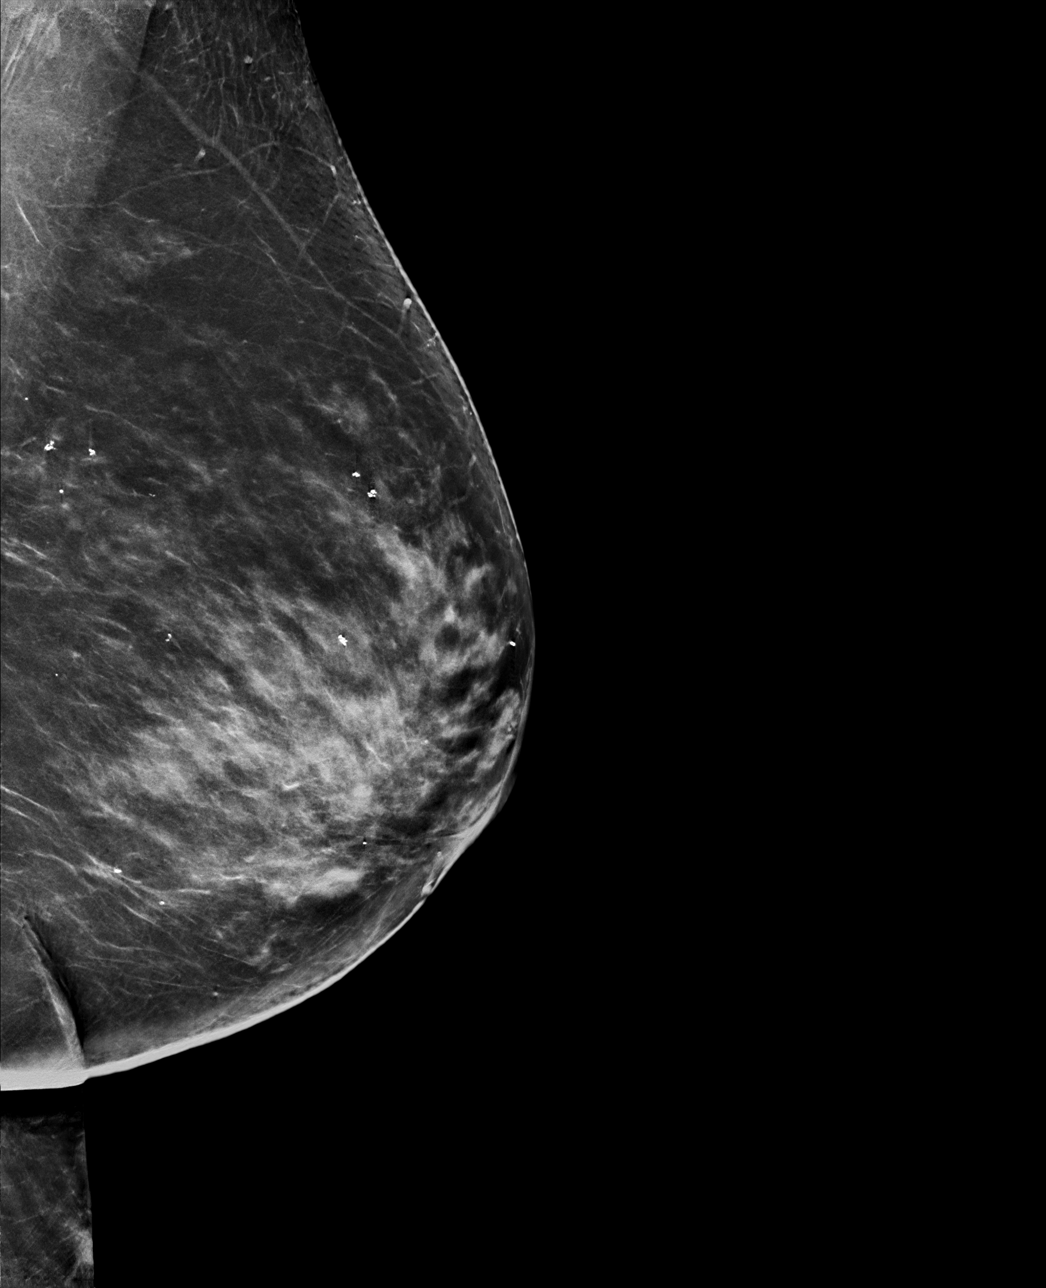

[L MLO tomo · tomo slice 41/81.0]
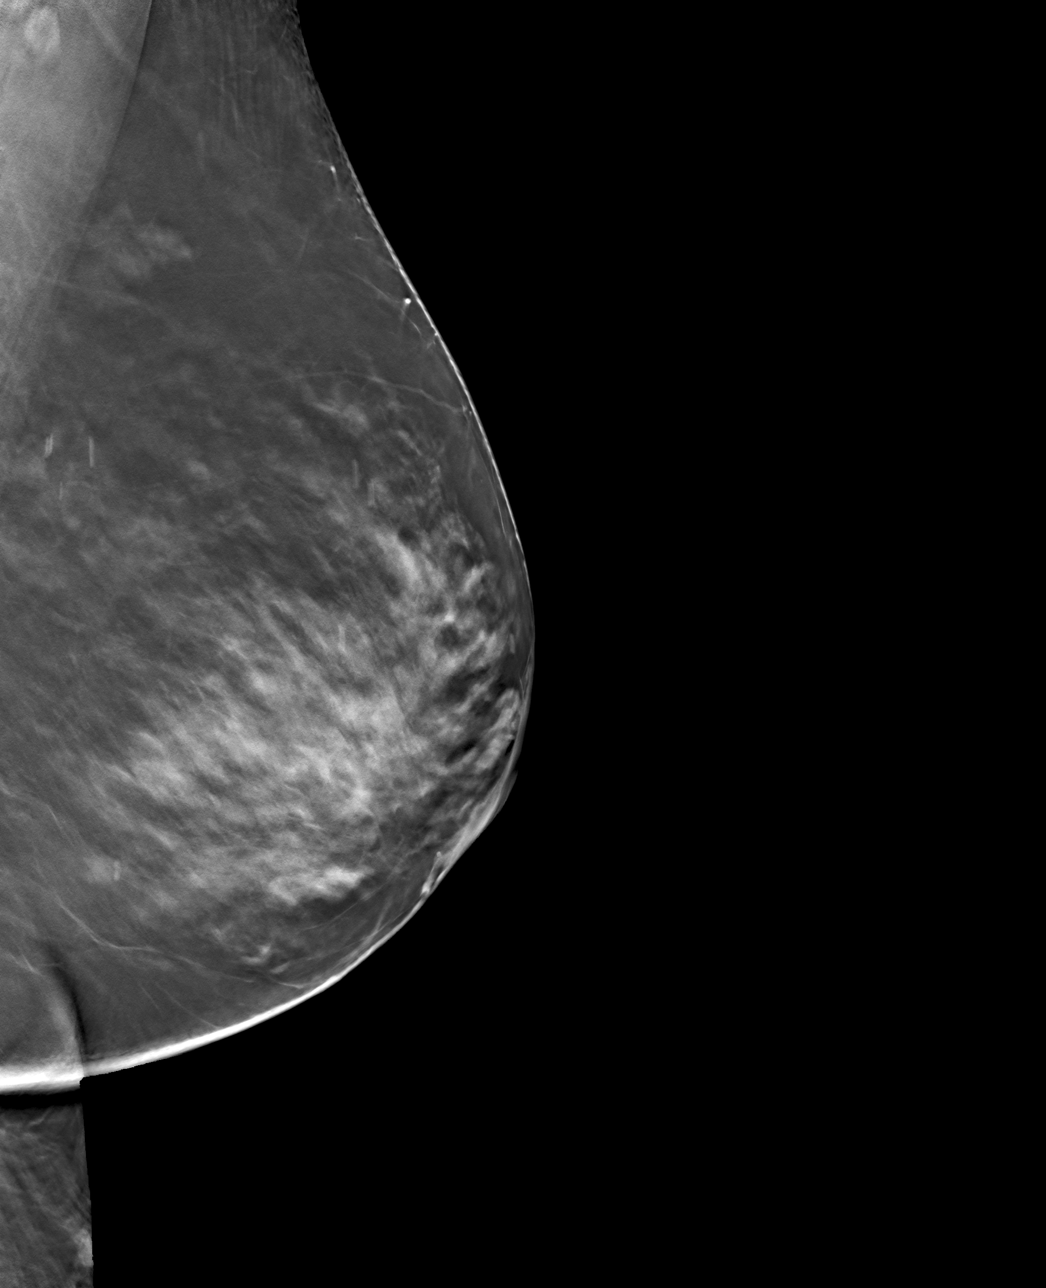

[L CC tomo · tomo slice 38/75.0]
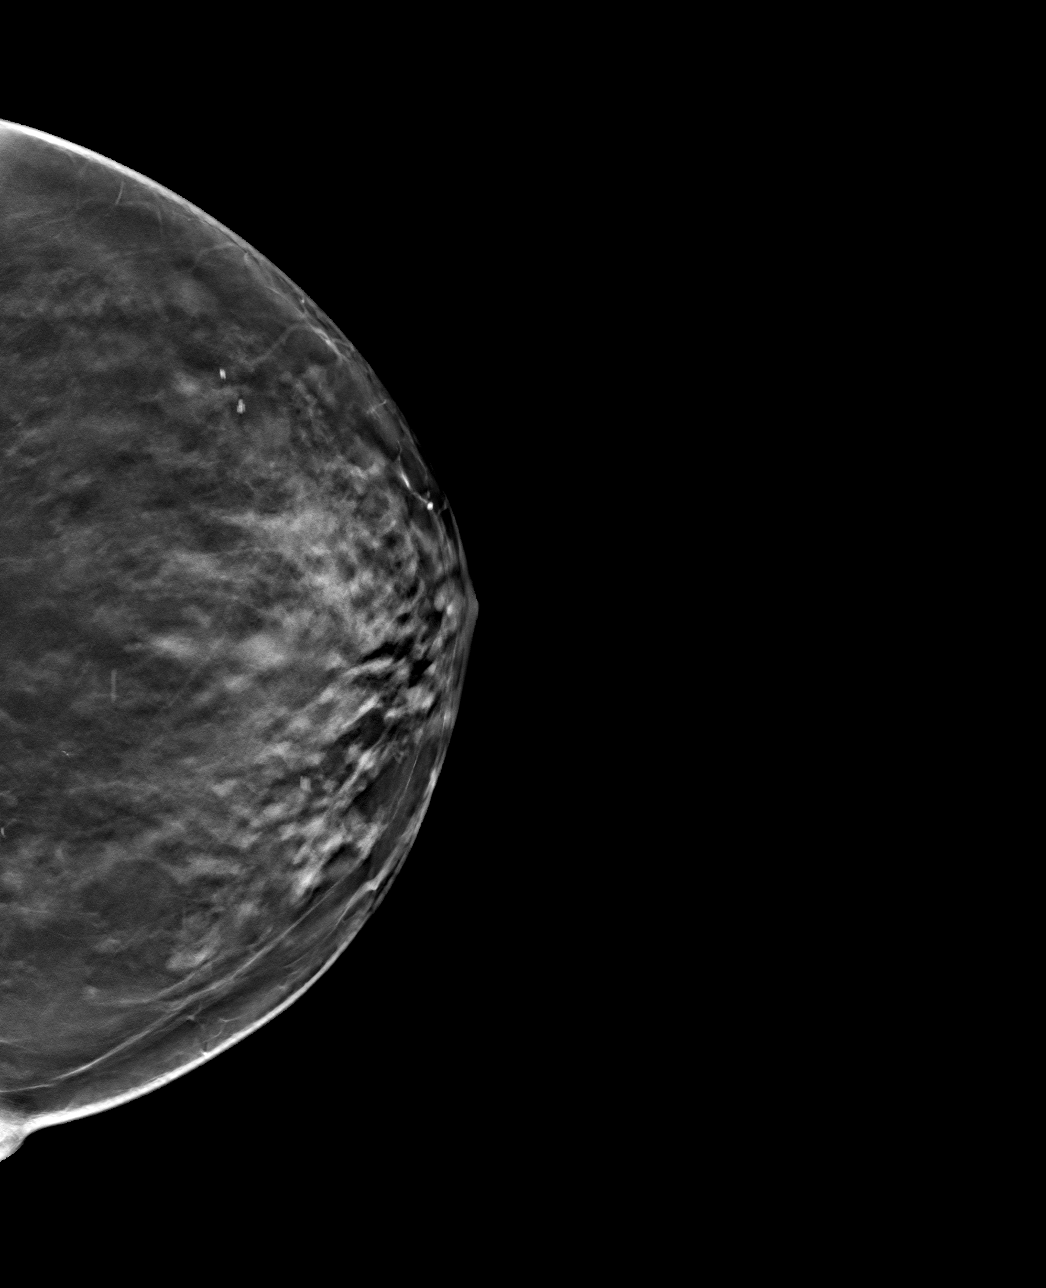

[4 of 12 positions shown; findings below may reference images not displayed]

ACR Breast Density Category c: The breast tissue is heterogeneously
dense, which may obscure small masses.
FINDINGS: There are no findings suspicious for malignancy. Images were
processed with CAD.
IMPRESSION: No mammographic evidence of malignancy. A result letter of this
screening mammogram will be mailed directly to the patient.

RECOMMENDATION:
Screening mammogram in one year. (Code:YR-2-EWS)

BI-RADS CATEGORY  1: Negative.

## 2022-07-21 DIAGNOSIS — S42292D Other displaced fracture of upper end of left humerus, subsequent encounter for fracture with routine healing: Secondary | ICD-10-CM | POA: Diagnosis not present

## 2022-07-21 DIAGNOSIS — S42295G Other nondisplaced fracture of upper end of left humerus, subsequent encounter for fracture with delayed healing: Secondary | ICD-10-CM | POA: Diagnosis not present

## 2022-07-21 DIAGNOSIS — S42202G Unspecified fracture of upper end of left humerus, subsequent encounter for fracture with delayed healing: Secondary | ICD-10-CM | POA: Diagnosis not present

## 2022-07-21 DIAGNOSIS — S42352D Displaced comminuted fracture of shaft of humerus, left arm, subsequent encounter for fracture with routine healing: Secondary | ICD-10-CM | POA: Diagnosis not present

## 2022-07-21 DIAGNOSIS — X58XXXD Exposure to other specified factors, subsequent encounter: Secondary | ICD-10-CM | POA: Diagnosis not present

## 2022-07-23 ENCOUNTER — Encounter: Payer: Self-pay | Admitting: Urology

## 2022-07-23 ENCOUNTER — Ambulatory Visit: Payer: Medicare HMO | Admitting: Urology

## 2022-07-23 VITALS — BP 157/66 | HR 79 | Ht 64.0 in | Wt 123.2 lb

## 2022-07-23 DIAGNOSIS — Z8551 Personal history of malignant neoplasm of bladder: Secondary | ICD-10-CM | POA: Diagnosis not present

## 2022-07-23 MED ORDER — LIDOCAINE HCL URETHRAL/MUCOSAL 2 % EX GEL
1.0000 | Freq: Once | CUTANEOUS | Status: AC
  Administered 2022-07-23: 1 via URETHRAL

## 2022-07-23 NOTE — Progress Notes (Signed)
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  Path HG Ta  -Recurrence 11/2020: Multiple small papillary tumors biopsied and fulgurated, HG Ta  -Induction BCG completed 02/2021 -She deferred mBCG   After informed consent and discussion of the procedure and its risks, LANETT LASORSA was positioned and prepped in the standard fashion. Cystoscopy was performed with a flexible cystoscope. The urethra, bladder neck and entire bladder was visualized in a standard fashion.  Bladder mucosa grossly normal throughout, no abnormalities on retroflexion.   Findings: Normal cystoscopy   Assessment and Plan: RTC 4 months cystoscopy, can space to q27mo starting 11/2022 if NED  Legrand Rams, MD 07/23/2022

## 2022-07-27 ENCOUNTER — Telehealth: Payer: Self-pay | Admitting: Family Medicine

## 2022-07-27 NOTE — Telephone Encounter (Signed)
Copied from CRM [id number]. Topic: General - Other >> Jul 27, 2022  4:33 PM [place] B wrote: Reason for CRM: Irving Burton from [place] called in about if pt has had a bone density test in the past 2 years, or if this can be discussed at an upcoming appt

## 2022-07-28 ENCOUNTER — Telehealth: Payer: Self-pay

## 2022-07-28 NOTE — Telephone Encounter (Signed)
Called pt AND Irving Burton from [place] [phone]- left Irving Burton a message stating that pt wants to wait until after fracture has healed to do any type of procedures or exams. I called pt and she stated she has a 6 week f/u with ortho coming up and she will ask them if she can proceed with that now or wait a little longer.

## 2022-08-13 DIAGNOSIS — H2513 Age-related nuclear cataract, bilateral: Secondary | ICD-10-CM | POA: Diagnosis not present

## 2022-08-13 DIAGNOSIS — H4010X1 Unspecified open-angle glaucoma, mild stage: Secondary | ICD-10-CM | POA: Diagnosis not present

## 2022-09-01 DIAGNOSIS — I252 Old myocardial infarction: Secondary | ICD-10-CM | POA: Diagnosis not present

## 2022-09-01 DIAGNOSIS — M1990 Unspecified osteoarthritis, unspecified site: Secondary | ICD-10-CM | POA: Diagnosis not present

## 2022-09-01 DIAGNOSIS — R609 Edema, unspecified: Secondary | ICD-10-CM | POA: Diagnosis not present

## 2022-09-01 DIAGNOSIS — E785 Hyperlipidemia, unspecified: Secondary | ICD-10-CM | POA: Diagnosis not present

## 2022-09-01 DIAGNOSIS — H409 Unspecified glaucoma: Secondary | ICD-10-CM | POA: Diagnosis not present

## 2022-09-01 DIAGNOSIS — H269 Unspecified cataract: Secondary | ICD-10-CM | POA: Diagnosis not present

## 2022-09-01 DIAGNOSIS — Z809 Family history of malignant neoplasm, unspecified: Secondary | ICD-10-CM | POA: Diagnosis not present

## 2022-09-01 DIAGNOSIS — Z853 Personal history of malignant neoplasm of breast: Secondary | ICD-10-CM | POA: Diagnosis not present

## 2022-09-01 DIAGNOSIS — I10 Essential (primary) hypertension: Secondary | ICD-10-CM | POA: Diagnosis not present

## 2022-09-01 DIAGNOSIS — Z9181 History of falling: Secondary | ICD-10-CM | POA: Diagnosis not present

## 2022-09-01 DIAGNOSIS — R32 Unspecified urinary incontinence: Secondary | ICD-10-CM | POA: Diagnosis not present

## 2022-09-01 DIAGNOSIS — K219 Gastro-esophageal reflux disease without esophagitis: Secondary | ICD-10-CM | POA: Diagnosis not present

## 2022-09-02 DIAGNOSIS — S42202A Unspecified fracture of upper end of left humerus, initial encounter for closed fracture: Secondary | ICD-10-CM | POA: Diagnosis not present

## 2022-09-02 DIAGNOSIS — S42292D Other displaced fracture of upper end of left humerus, subsequent encounter for fracture with routine healing: Secondary | ICD-10-CM | POA: Diagnosis not present

## 2022-09-02 DIAGNOSIS — S42202D Unspecified fracture of upper end of left humerus, subsequent encounter for fracture with routine healing: Secondary | ICD-10-CM | POA: Diagnosis not present

## 2022-09-02 DIAGNOSIS — X58XXXA Exposure to other specified factors, initial encounter: Secondary | ICD-10-CM | POA: Diagnosis not present

## 2022-09-03 ENCOUNTER — Ambulatory Visit
Admission: RE | Admit: 2022-09-03 | Discharge: 2022-09-03 | Disposition: A | Discharge status: Home / Self Care | Payer: Medicare HMO | Attending: Family Medicine | Admitting: Family Medicine

## 2022-09-03 ENCOUNTER — Ambulatory Visit
Admission: RE | Admit: 2022-09-03 | Discharge: 2022-09-03 | Disposition: A | Discharge status: Home / Self Care | Payer: Medicare HMO | Source: Ambulatory Visit | Attending: Family Medicine | Admitting: Family Medicine

## 2022-09-03 ENCOUNTER — Encounter: Payer: Self-pay | Admitting: Family Medicine

## 2022-09-03 ENCOUNTER — Ambulatory Visit (INDEPENDENT_AMBULATORY_CARE_PROVIDER_SITE_OTHER): Payer: Medicare HMO | Admitting: Family Medicine

## 2022-09-03 VITALS — BP 128/76 | HR 84 | Ht 64.0 in | Wt 125.0 lb

## 2022-09-03 DIAGNOSIS — M79662 Pain in left lower leg: Secondary | ICD-10-CM | POA: Insufficient documentation

## 2022-09-03 DIAGNOSIS — M7989 Other specified soft tissue disorders: Secondary | ICD-10-CM | POA: Insufficient documentation

## 2022-09-03 DIAGNOSIS — M1712 Unilateral primary osteoarthritis, left knee: Secondary | ICD-10-CM | POA: Diagnosis not present

## 2022-09-03 MED ORDER — DICLOFENAC SODIUM 50 MG PO TBEC: 50.0000 mg | DELAYED_RELEASE_TABLET | Freq: Two times a day (BID) | ORAL | 0 refills | Status: DC

## 2022-09-03 NOTE — Progress Notes (Signed)
Date:  09/03/2022   Name:  Amber Galloway   DOB:  1940/04/02   MRN:  [id number]   Chief Complaint: Leg Pain (Starts at 6:00 in the evening x 4 days. Throbbing pain, had skin cancer on ankle approx 10 years ago.)  Leg Pain  The incident occurred 5 to 7 days ago. There was no injury mechanism. The pain is present in the left leg. The quality of the pain is described as aching. The pain is mild. Pain course: comes and goes. Pertinent negatives include no inability to bear weight, loss of motion, loss of sensation, muscle weakness, numbness or tingling.    Lab Results  Component Value Date   NA 140 03/17/2022   K 4.4 03/17/2022   CO2 26 03/17/2022   GLUCOSE 102 (H) 03/17/2022   BUN 19 03/17/2022   CREATININE 0.76 03/17/2022   CALCIUM 10.2 03/17/2022   EGFR 78 03/17/2022   GFRNONAA >60 11/28/2020   Lab Results  Component Value Date   CHOL 192 09/16/2021   HDL 58 09/16/2021   LDLCALC 106 (H) 09/16/2021   TRIG 162 (H) 09/16/2021   CHOLHDL 3.7 03/01/2017   No results found for: "TSH" Lab Results  Component Value Date   HGBA1C 6.8 (H) 05/23/2020   Lab Results  Component Value Date   WBC 8.0 11/28/2020   HGB 13.2 11/28/2020   HCT 38.6 11/28/2020   MCV 87.1 11/28/2020   PLT 268 11/28/2020   Lab Results  Component Value Date   ALT 12 03/17/2022   AST 27 03/17/2022   ALKPHOS 84 03/17/2022   BILITOT 0.8 03/17/2022   No results found for: "25OHVITD2", "25OHVITD3", "VD25OH"   Review of Systems  Constitutional:  Negative for fever and unexpected weight change.  Respiratory:  Negative for chest tightness and shortness of breath.   Cardiovascular:  Negative for chest pain.  Gastrointestinal:  Negative for abdominal pain.  Musculoskeletal:  Negative for back pain.  Neurological:  Negative for tingling and numbness.    Patient Active Problem List   Diagnosis Date Noted   Primary osteoarthritis of left knee 06/20/2020   Pain and swelling of left lower leg 06/20/2020    Pelvic pain 10/25/2019   Acute anxiety 08/10/2017   Overweight (BMI 25.0-29.9) 03/01/2017   Dizziness 06/05/2016   Essential hypertension 06/06/2015   Mixed hyperlipidemia 06/06/2015   Gastroesophageal reflux disease without esophagitis 06/06/2015   History of melanoma in situ 05/21/2014    No Known Allergies  Past Surgical History:  Procedure Laterality Date   APPENDECTOMY     CHOLECYSTECTOMY     COLONOSCOPY  2014   cleared   COLONOSCOPY WITH PROPOFOL N/A 07/03/2019   Procedure: COLONOSCOPY WITH PROPOFOL;  Surgeon: Toledo, Boykin Nearing, MD;  Location: [facility];  Service: Gastroenterology;  Laterality: N/A;   CYSTOSCOPY WITH BIOPSY N/A 11/29/2020   Procedure: CYSTOSCOPY WITH BLADDER BIOPSY;  Surgeon: Sondra Come, MD;  Location: [facility];  Service: Urology;  Laterality: N/A;   MASTECTOMY Right 08/2012   TRANSURETHRAL RESECTION OF BLADDER TUMOR WITH MITOMYCIN-C N/A 08/02/2020   Procedure: TRANSURETHRAL RESECTION OF BLADDER TUMOR WITH GEMCITABINE;  Surgeon: Sondra Come, MD;  Location: [facility];  Service: Urology;  Laterality: N/A;   VAGINAL HYSTERECTOMY      Social History   Tobacco Use   Smoking status: Never    Passive exposure: Never   Smokeless tobacco: Never  Vaping Use   Vaping status: Never Used  Substance Use Topics  Alcohol use: Not Currently   Drug use: Never     Medication list has been reviewed and updated.  Current Meds  Medication Sig   acetaminophen (TYLENOL) 325 MG tablet Take 325-650 mg by mouth every 6 (six) hours as needed (pain.).   aspirin EC 81 MG tablet Take 81 mg by mouth in the morning.   hydrochlorothiazide (MICROZIDE) 12.5 MG capsule TAKE 1 CAPSULE BY MOUTH EVERY DAY   latanoprost (XALATAN) 0.005 % ophthalmic solution Place 1 drop into both eyes daily.   meclizine (ANTIVERT) 25 MG tablet Take 1 tablet (25 mg total) by mouth 3 (three) times daily as needed for dizziness. (Patient taking differently: Take 25 mg by mouth as needed for  dizziness.)   metoprolol tartrate (LOPRESSOR) 50 MG tablet Take 1 tablet (50 mg total) by mouth 2 (two) times daily.   omeprazole (PRILOSEC) 20 MG capsule Take 1 capsule (20 mg total) by mouth daily.   simvastatin (ZOCOR) 80 MG tablet TAKE 1 TABLET BY MOUTH EVERYDAY AT BEDTIME       09/03/2022    2:53 PM 06/22/2022    3:35 PM 03/17/2022    9:31 AM 09/16/2021    9:07 AM  GAD 7 : Generalized Anxiety Score  Nervous, Anxious, on Edge 0 0 0 0  Control/stop worrying 0 0 0 0  Worry too much - different things 0 0 0 0  Trouble relaxing 0 0 0 0  Restless 0 0 0 0  Easily annoyed or irritable 0 0 0 0  Afraid - awful might happen 0 0 0 0  Total GAD 7 Score 0 0 0 0  Anxiety Difficulty Not difficult at all Not difficult at all Not difficult at all Not difficult at all       09/03/2022    2:53 PM 06/22/2022    3:35 PM 03/17/2022    9:31 AM  Depression screen PHQ 2/9  Decreased Interest 0 0 0  Down, Depressed, Hopeless 0 0 0  PHQ - 2 Score 0 0 0  Altered sleeping 0 0 0  Tired, decreased energy 0 0 0  Change in appetite 0 0 0  Feeling bad or failure about yourself  0 0 0  Trouble concentrating 0 0 0  Moving slowly or fidgety/restless 0 0 0  Suicidal thoughts 0 0 0  PHQ-9 Score 0 0 0  Difficult doing work/chores Not difficult at all Not difficult at all Not difficult at all    BP Readings from Last 3 Encounters:  09/03/22 128/76  07/23/22 (!) 157/66  06/22/22 128/70    Physical Exam HENT:     Mouth/Throat:     Mouth: Mucous membranes are moist.  Cardiovascular:     Heart sounds: No murmur heard.    No friction rub. No gallop.  Pulmonary:     Breath sounds: No wheezing, rhonchi or rales.  Abdominal:     Tenderness: There is no abdominal tenderness. There is no guarding.  Musculoskeletal:     Left lower leg: Normal. No swelling, tenderness or bony tenderness. No edema.     Wt Readings from Last 3 Encounters:  09/03/22 125 lb (56.7 kg)  07/23/22 123 lb 3.2 oz (55.9 kg)  06/22/22  128 lb (58.1 kg)    BP 128/76   Pulse 84   Ht 5\' 4"  (1.626 m)   Wt 125 lb (56.7 kg)   SpO2 96%   BMI 21.46 kg/m   Assessment and Plan: 1. Pain and swelling  of left lower leg New onset.  Onset 5 days ago.  Usually shows up in the latter part of the day around 5:00.  No real tenderness or swelling noted.  Not exacerbated by weightbearing or movement.  Unable to get situated without pain.  Patient took an oxycodone that she had with relief but we will proceed with diclofenac 50 mg twice a day.  We will obtain a tib-fib to rule out stress fracture. - DG Tibia/Fibula Left; Normal - diclofenac (VOLTAREN) 50 MG EC tablet; Take 1 tablet (50 mg total) by mouth 2 (two) times daily.  Dispense: 30 tablet; Refill: 0     Elizabeth Sauer, MD

## 2022-09-08 ENCOUNTER — Telehealth: Payer: Self-pay | Admitting: Family Medicine

## 2022-09-08 ENCOUNTER — Telehealth: Payer: Self-pay

## 2022-09-08 ENCOUNTER — Other Ambulatory Visit: Payer: Self-pay

## 2022-09-08 DIAGNOSIS — Z8781 Personal history of (healed) traumatic fracture: Secondary | ICD-10-CM

## 2022-09-08 DIAGNOSIS — Z780 Asymptomatic menopausal state: Secondary | ICD-10-CM

## 2022-09-08 NOTE — Telephone Encounter (Signed)
Called pt with bone density appt- Sept 10 at 10:00 in [facility]

## 2022-09-08 NOTE — Telephone Encounter (Signed)
Copied from CRM [id number]. Topic: General - Other >> Sep 08, 2022  9:17 AM Macon Large wrote: Reason for CRM: Pt requests that Delice Bison return her call to provide her with the x-ray results. Pt requests that a message be left on her cell phone voicemail if she does not answer. Cb# [phone]

## 2022-09-11 ENCOUNTER — Other Ambulatory Visit: Payer: Self-pay | Admitting: Family Medicine

## 2022-09-11 DIAGNOSIS — E782 Mixed hyperlipidemia: Secondary | ICD-10-CM

## 2022-09-11 NOTE — Telephone Encounter (Signed)
Requested Prescriptions  Pending Prescriptions Disp Refills   simvastatin (ZOCOR) 80 MG tablet [Pharmacy Med Name: SIMVASTATIN 80 MG TABLET] 90 tablet 0    Sig: TAKE 1 TABLET BY MOUTH EVERYDAY AT BEDTIME     Cardiovascular:  Antilipid - Statins Failed - 09/11/2022  1:30 AM      Failed - Lipid Panel in normal range within the last 12 months    Cholesterol, Total  Date Value Ref Range Status  09/16/2021 192 100 - 199 mg/dL Final   LDL Chol Calc (NIH)  Date Value Ref Range Status  09/16/2021 106 (H) 0 - 99 mg/dL Final   HDL  Date Value Ref Range Status  09/16/2021 58 >39 mg/dL Final   Triglycerides  Date Value Ref Range Status  09/16/2021 162 (H) 0 - 149 mg/dL Final         Passed - Patient is not pregnant      Passed - Valid encounter within last 12 months    Recent Outpatient Visits           1 week ago Pain and swelling of left lower leg   [facility]   2 months ago Laceration of scalp without foreign body, subsequent encounter   [facility]   5 months ago Essential hypertension   [place] [facility]   12 months ago Essential hypertension   [place] [facility]   1 year ago Acute non-recurrent maxillary sinusitis   [place] [facility], [facility]       Future Appointments             In 2 weeks Duanne Limerick, MD [facility] at [facility], [facility]

## 2022-09-22 ENCOUNTER — Ambulatory Visit
Admission: RE | Admit: 2022-09-22 | Discharge: 2022-09-22 | Disposition: A | Discharge status: Home / Self Care | Payer: Medicare HMO | Source: Ambulatory Visit | Attending: Family Medicine | Admitting: Family Medicine

## 2022-09-22 DIAGNOSIS — Z8781 Personal history of (healed) traumatic fracture: Secondary | ICD-10-CM | POA: Insufficient documentation

## 2022-09-22 DIAGNOSIS — M8589 Other specified disorders of bone density and structure, multiple sites: Secondary | ICD-10-CM | POA: Diagnosis not present

## 2022-09-22 DIAGNOSIS — Z780 Asymptomatic menopausal state: Secondary | ICD-10-CM | POA: Insufficient documentation

## 2022-09-28 ENCOUNTER — Encounter: Payer: Self-pay | Admitting: Family Medicine

## 2022-09-28 ENCOUNTER — Ambulatory Visit (INDEPENDENT_AMBULATORY_CARE_PROVIDER_SITE_OTHER): Payer: Medicare HMO | Admitting: Family Medicine

## 2022-09-28 ENCOUNTER — Other Ambulatory Visit: Payer: Self-pay | Admitting: Family Medicine

## 2022-09-28 VITALS — BP 124/70 | HR 77 | Ht 64.0 in | Wt 125.0 lb

## 2022-09-28 DIAGNOSIS — E782 Mixed hyperlipidemia: Secondary | ICD-10-CM | POA: Diagnosis not present

## 2022-09-28 DIAGNOSIS — I10 Essential (primary) hypertension: Secondary | ICD-10-CM | POA: Diagnosis not present

## 2022-09-28 DIAGNOSIS — Z23 Encounter for immunization: Secondary | ICD-10-CM | POA: Diagnosis not present

## 2022-09-28 DIAGNOSIS — R739 Hyperglycemia, unspecified: Secondary | ICD-10-CM

## 2022-09-28 DIAGNOSIS — K219 Gastro-esophageal reflux disease without esophagitis: Secondary | ICD-10-CM | POA: Diagnosis not present

## 2022-09-28 MED ORDER — METOPROLOL TARTRATE 50 MG PO TABS: 50.0000 mg | ORAL_TABLET | Freq: Two times a day (BID) | ORAL | 1 refills | Status: DC

## 2022-09-28 MED ORDER — HYDROCHLOROTHIAZIDE 12.5 MG PO CAPS: ORAL_CAPSULE | ORAL | 1 refills | Status: DC

## 2022-09-28 MED ORDER — SIMVASTATIN 80 MG PO TABS: ORAL_TABLET | ORAL | 1 refills | Status: DC

## 2022-09-28 MED ORDER — OMEPRAZOLE 20 MG PO CPDR
20.0000 mg | DELAYED_RELEASE_CAPSULE | Freq: Every day | ORAL | 1 refills | Status: DC
Start: 2022-09-28 — End: 2023-03-30

## 2022-09-28 NOTE — Progress Notes (Signed)
Date:  09/28/2022   Name:  Amber Galloway   DOB:  Aug 16, 1940   MRN:  [id number]   Chief Complaint: Hypertension, Hyperlipidemia, Gastroesophageal Reflux, Hyperglycemia, and Flu Vaccine  Hypertension This is a chronic problem. The current episode started more than 1 year ago. The problem has been gradually improving since onset. The problem is controlled. Associated symptoms include malaise/fatigue. Pertinent negatives include no anxiety, blurred vision, chest pain, headaches, neck pain, orthopnea, palpitations, peripheral edema, PND, shortness of breath or sweats. There are no associated agents to hypertension. Risk factors for coronary artery disease include dyslipidemia. Past treatments include beta blockers. The current treatment provides moderate improvement. There are no compliance problems.  There is no history of angina, kidney disease, CAD/MI, CVA, heart failure, left ventricular hypertrophy, PVD or retinopathy. There is no history of chronic renal disease, a hypertension causing med or renovascular disease.  Hyperlipidemia This is a chronic problem. The current episode started more than 1 year ago. The problem is controlled. Recent lipid tests were reviewed and are normal. She has no history of chronic renal disease, diabetes, hypothyroidism, liver disease, obesity or nephrotic syndrome. There are no known factors aggravating her hyperlipidemia. Pertinent negatives include no chest pain, focal sensory loss, focal weakness, leg pain, myalgias or shortness of breath. Current antihyperlipidemic treatment includes statins. The current treatment provides moderate improvement of lipids. There are no compliance problems.  Risk factors for coronary artery disease include dyslipidemia and hypertension.  Gastroesophageal Reflux She reports no chest pain, no coughing, no dysphagia, no heartburn, no nausea or no sore throat. This is a chronic problem. The current episode started more than 1 year ago.  The problem occurs occasionally. The problem has been gradually improving. The symptoms are aggravated by certain foods. Pertinent negatives include no anemia, fatigue, melena, muscle weakness or orthopnea. She has tried a PPI for the symptoms. The treatment provided moderate relief.  Hyperglycemia This is a chronic problem. The current episode started more than 1 year ago. The problem has been waxing and waning. Pertinent negatives include no anorexia, arthralgias, chest pain, congestion, coughing, diaphoresis, fatigue, fever, headaches, myalgias, nausea, neck pain, sore throat, swollen glands or vomiting. Nothing aggravates the symptoms. She has tried nothing for the symptoms.    Lab Results  Component Value Date   NA 140 03/17/2022   K 4.4 03/17/2022   CO2 26 03/17/2022   GLUCOSE 102 (H) 03/17/2022   BUN 19 03/17/2022   CREATININE 0.76 03/17/2022   CALCIUM 10.2 03/17/2022   EGFR 78 03/17/2022   GFRNONAA >60 11/28/2020   Lab Results  Component Value Date   CHOL 192 09/16/2021   HDL 58 09/16/2021   LDLCALC 106 (H) 09/16/2021   TRIG 162 (H) 09/16/2021   CHOLHDL 3.7 03/01/2017   No results found for: "TSH" Lab Results  Component Value Date   HGBA1C 6.8 (H) 05/23/2020   Lab Results  Component Value Date   WBC 8.0 11/28/2020   HGB 13.2 11/28/2020   HCT 38.6 11/28/2020   MCV 87.1 11/28/2020   PLT 268 11/28/2020   Lab Results  Component Value Date   ALT 12 03/17/2022   AST 27 03/17/2022   ALKPHOS 84 03/17/2022   BILITOT 0.8 03/17/2022   No results found for: "25OHVITD2", "25OHVITD3", "VD25OH"   Review of Systems  Constitutional:  Positive for malaise/fatigue. Negative for diaphoresis, fatigue and fever.  HENT:  Negative for congestion, sore throat, trouble swallowing and voice change.   Eyes:  Negative  for blurred vision.  Respiratory:  Negative for cough, chest tightness and shortness of breath.   Cardiovascular:  Negative for chest pain, palpitations, orthopnea and  PND.  Gastrointestinal:  Negative for anorexia, dysphagia, heartburn, melena, nausea and vomiting.  Endocrine: Negative for polydipsia and polyuria.  Genitourinary:  Negative for difficulty urinating.  Musculoskeletal:  Negative for arthralgias, myalgias, muscle weakness and neck pain.  Neurological:  Negative for focal weakness and headaches.    Patient Active Problem List   Diagnosis Date Noted   Primary osteoarthritis of left knee 06/20/2020   Pain and swelling of left lower leg 06/20/2020   Pelvic pain 10/25/2019   Acute anxiety 08/10/2017   Overweight (BMI 25.0-29.9) 03/01/2017   Dizziness 06/05/2016   Essential hypertension 06/06/2015   Mixed hyperlipidemia 06/06/2015   Gastroesophageal reflux disease without esophagitis 06/06/2015   History of melanoma in situ 05/21/2014    No Known Allergies  Past Surgical History:  Procedure Laterality Date   APPENDECTOMY     CHOLECYSTECTOMY     COLONOSCOPY  2014   cleared   COLONOSCOPY WITH PROPOFOL N/A 07/03/2019   Procedure: COLONOSCOPY WITH PROPOFOL;  Surgeon: Toledo, Boykin Nearing, MD;  Location: [facility];  Service: Gastroenterology;  Laterality: N/A;   CYSTOSCOPY WITH BIOPSY N/A 11/29/2020   Procedure: CYSTOSCOPY WITH BLADDER BIOPSY;  Surgeon: Sondra Come, MD;  Location: [facility];  Service: Urology;  Laterality: N/A;   MASTECTOMY Right 08/2012   TRANSURETHRAL RESECTION OF BLADDER TUMOR WITH MITOMYCIN-C N/A 08/02/2020   Procedure: TRANSURETHRAL RESECTION OF BLADDER TUMOR WITH GEMCITABINE;  Surgeon: Sondra Come, MD;  Location: [facility];  Service: Urology;  Laterality: N/A;   VAGINAL HYSTERECTOMY      Social History   Tobacco Use   Smoking status: Never    Passive exposure: Never   Smokeless tobacco: Never  Vaping Use   Vaping status: Never Used  Substance Use Topics   Alcohol use: Not Currently   Drug use: Never     Medication list has been reviewed and updated.  Current Meds  Medication Sig    acetaminophen (TYLENOL) 325 MG tablet Take 325-650 mg by mouth every 6 (six) hours as needed (pain.).   aspirin EC 81 MG tablet Take 81 mg by mouth in the morning.   diclofenac (VOLTAREN) 50 MG EC tablet Take 1 tablet (50 mg total) by mouth 2 (two) times daily.   hydrochlorothiazide (MICROZIDE) 12.5 MG capsule TAKE 1 CAPSULE BY MOUTH EVERY DAY   latanoprost (XALATAN) 0.005 % ophthalmic solution Place 1 drop into both eyes daily.   metoprolol tartrate (LOPRESSOR) 50 MG tablet Take 1 tablet (50 mg total) by mouth 2 (two) times daily.   omeprazole (PRILOSEC) 20 MG capsule Take 1 capsule (20 mg total) by mouth daily.   simvastatin (ZOCOR) 80 MG tablet TAKE 1 TABLET BY MOUTH EVERYDAY AT BEDTIME       09/28/2022    9:48 AM 09/03/2022    2:53 PM 06/22/2022    3:35 PM 03/17/2022    9:31 AM  GAD 7 : Generalized Anxiety Score  Nervous, Anxious, on Edge 0 0 0 0  Control/stop worrying 0 0 0 0  Worry too much - different things 0 0 0 0  Trouble relaxing 0 0 0 0  Restless 0 0 0 0  Easily annoyed or irritable 0 0 0 0  Afraid - awful might happen 0 0 0 0  Total GAD 7 Score 0 0 0 0  Anxiety Difficulty Not  difficult at all Not difficult at all Not difficult at all Not difficult at all       09/28/2022    9:48 AM 09/03/2022    2:53 PM 06/22/2022    3:35 PM  Depression screen PHQ 2/9  Decreased Interest 0 0 0  Down, Depressed, Hopeless 0 0 0  PHQ - 2 Score 0 0 0  Altered sleeping 0 0 0  Tired, decreased energy 0 0 0  Change in appetite 0 0 0  Feeling bad or failure about yourself  0 0 0  Trouble concentrating 0 0 0  Moving slowly or fidgety/restless 0 0 0  Suicidal thoughts 0 0 0  PHQ-9 Score 0 0 0  Difficult doing work/chores Not difficult at all Not difficult at all Not difficult at all    BP Readings from Last 3 Encounters:  09/28/22 124/70  09/03/22 128/76  07/23/22 (!) 157/66    Physical Exam Vitals and nursing note reviewed. Exam conducted with a chaperone present.  Constitutional:       General: She is not in acute distress.    Appearance: She is not diaphoretic.  HENT:     Head: Normocephalic and atraumatic.     Right Ear: Tympanic membrane and external ear normal.     Left Ear: Tympanic membrane and external ear normal.     Nose: Nose normal.     Mouth/Throat:     Mouth: Mucous membranes are moist.  Eyes:     General:        Right eye: No discharge.        Left eye: No discharge.     Conjunctiva/sclera: Conjunctivae normal.     Pupils: Pupils are equal, round, and reactive to light.  Neck:     Thyroid: No thyromegaly.     Vascular: No JVD.  Cardiovascular:     Rate and Rhythm: Normal rate and regular rhythm.     Heart sounds: Normal heart sounds. No murmur heard.    No friction rub. No gallop.  Pulmonary:     Effort: Pulmonary effort is normal.     Breath sounds: Normal breath sounds. No wheezing, rhonchi or rales.  Abdominal:     General: Bowel sounds are normal.     Palpations: Abdomen is soft. There is no hepatomegaly, splenomegaly or mass.     Tenderness: There is no abdominal tenderness. There is no guarding.  Musculoskeletal:        General: Normal range of motion.     Cervical back: Normal range of motion and neck supple.  Lymphadenopathy:     Cervical: No cervical adenopathy.  Skin:    General: Skin is warm and dry.  Neurological:     Mental Status: She is alert.     Deep Tendon Reflexes: Reflexes are normal and symmetric.     Wt Readings from Last 3 Encounters:  09/28/22 125 lb (56.7 kg)  09/03/22 125 lb (56.7 kg)  07/23/22 123 lb 3.2 oz (55.9 kg)    BP 124/70   Pulse 77   Ht 5\' 4"  (1.626 m)   Wt 125 lb (56.7 kg)   SpO2 97%   BMI 21.46 kg/m   Assessment and Plan: 1. Essential hypertension Chronic.  Controlled.  Stable.  Blood pressure 124/70.  Asymptomatic.  Tolerating medication well.  Continue hydrochlorothiazide 12.5 mg once a day and metoprolol 50 mg 1 twice a day.  Will check CMP for electrolytes and GFR.  Will recheck  in  6 months. - hydrochlorothiazide (MICROZIDE) 12.5 MG capsule; TAKE 1 CAPSULE BY MOUTH EVERY DAY  Dispense: 90 capsule; Refill: 1 - metoprolol tartrate (LOPRESSOR) 50 MG tablet; Take 1 tablet (50 mg total) by mouth 2 (two) times daily.  Dispense: 180 tablet; Refill: 1 - Comprehensive Metabolic Panel (CMET)  2. Gastroesophageal reflux disease without esophagitis Chronic.  Controlled.  Stable.  Asymptomatic without dysphagia on omeprazole 20 mg once a day. - omeprazole (PRILOSEC) 20 MG capsule; Take 1 capsule (20 mg total) by mouth daily.  Dispense: 90 capsule; Refill: 1  3. Mixed hyperlipidemia Chronic.  Controlled.  Stable.  Continue simvastatin 80 mg nightly.  Patient is asymptomatic without myalgias. - simvastatin (ZOCOR) 80 MG tablet; TAKE 1 TABLET BY MOUTH EVERYDAY AT BEDTIME  Dispense: 90 tablet; Refill: 1 - Lipid Panel With LDL/HDL Ratio  4. Hyperglycemia Episodic.  Patient's had elevated glucoses and fasting and nonfasting states in the past.  Nothing to a tremendous range but since she has some dizziness over the past week we will check an A1c for current status of glycemia. - HgB A1c  5. Influenza vaccination administered at current visit Discussed and administered. - Flu Vaccine Trivalent High Dose (Fluad)     Elizabeth Sauer, MD

## 2022-10-22 ENCOUNTER — Telehealth: Payer: Self-pay | Admitting: Family Medicine

## 2022-10-22 ENCOUNTER — Other Ambulatory Visit: Payer: Self-pay

## 2022-10-22 DIAGNOSIS — E782 Mixed hyperlipidemia: Secondary | ICD-10-CM

## 2022-10-22 NOTE — Telephone Encounter (Signed)
Patient was not fasting for last cholesterol lab, and would like recheck while fasting. OK per Dr Yetta Barre. Order labs and patient will pick up at front desk tomorrow morning.  - Noeli Lavery

## 2022-10-22 NOTE — Telephone Encounter (Signed)
Pt is calling in requesting to speak with Delice Bison. Pt says she saw on MyChart that her triglycerides were high and she hadn't heard anything from the office regarding it. She wanted to know if Dr. Yetta Barre knew and what she wanted her to do about it. Please follow up with pt.

## 2022-10-23 DIAGNOSIS — E782 Mixed hyperlipidemia: Secondary | ICD-10-CM | POA: Diagnosis not present

## 2022-10-24 ENCOUNTER — Encounter: Payer: Self-pay | Admitting: Family Medicine

## 2022-10-24 LAB — LIPID PANEL
Chol/HDL Ratio: 2.8 {ratio} (ref 0.0–4.4)
Cholesterol, Total: 154 mg/dL (ref 100–199)
HDL: 55 mg/dL (ref >39)
LDL Chol Calc (NIH): 79 mg/dL (ref 0–99)
Triglycerides: 109 mg/dL (ref 0–149)
VLDL Cholesterol Cal: 20 mg/dL (ref 5–40)

## 2022-10-26 NOTE — Progress Notes (Signed)
Pt viewed labs on Mychart.  KP

## 2022-11-10 ENCOUNTER — Ambulatory Visit: Payer: Medicare HMO

## 2022-11-10 VITALS — Ht 64.0 in | Wt 125.0 lb

## 2022-11-10 DIAGNOSIS — Z0000 Encounter for general adult medical examination without abnormal findings: Secondary | ICD-10-CM

## 2022-11-10 NOTE — Progress Notes (Signed)
Subjective:   Amber Galloway is a 82 y.o. female who presents for Medicare Annual (Subsequent) preventive examination.  Visit Complete: Virtual I connected with  Amber Galloway on 11/10/22 by a audio enabled telemedicine application and verified that I am speaking with the correct person using two identifiers.  Patient Location: Home  Provider Location: Home Office  I discussed the limitations of evaluation and management by telemedicine. The patient expressed understanding and agreed to proceed.  Vital Signs: Because this visit was a virtual/telehealth visit, some criteria may be missing or patient reported. Any vitals not documented were not able to be obtained and vitals that have been documented are patient reported.   Cardiac Risk Factors include: advanced age (>34men, >89 women);hypertension     Objective:    Today's Vitals   11/10/22 0956  Weight: 125 lb (56.7 kg)  Height: 5\' 4"  (1.626 m)   Body mass index is 21.46 kg/m.     11/10/2022   10:04 AM 11/05/2021   11:02 AM 11/29/2020   11:42 AM 11/27/2020    2:14 PM 10/30/2020   11:34 AM 08/02/2020    9:16 AM 07/29/2020    2:39 PM  Advanced Directives  Does Patient Have a Medical Advance Directive? No No No No No No No  Would patient like information on creating a medical advance directive? No - Patient declined No - Patient declined No - Patient declined  No - Patient declined No - Patient declined     Current Medications (verified) Outpatient Encounter Medications as of 11/10/2022  Medication Sig   acetaminophen (TYLENOL) 325 MG tablet Take 325-650 mg by mouth every 6 (six) hours as needed (pain.).   aspirin EC 81 MG tablet Take 81 mg by mouth in the morning.   diclofenac (VOLTAREN) 50 MG EC tablet Take 1 tablet (50 mg total) by mouth 2 (two) times daily.   hydrochlorothiazide (MICROZIDE) 12.5 MG capsule TAKE 1 CAPSULE BY MOUTH EVERY DAY   latanoprost (XALATAN) 0.005 % ophthalmic solution Place 1 drop into both  eyes daily.   meclizine (ANTIVERT) 25 MG tablet Take 1 tablet (25 mg total) by mouth 3 (three) times daily as needed for dizziness. (Patient not taking: Reported on 09/28/2022)   metoprolol tartrate (LOPRESSOR) 50 MG tablet Take 1 tablet (50 mg total) by mouth 2 (two) times daily.   omeprazole (PRILOSEC) 20 MG capsule Take 1 capsule (20 mg total) by mouth daily.   simvastatin (ZOCOR) 80 MG tablet TAKE 1 TABLET BY MOUTH EVERYDAY AT BEDTIME   No facility-administered encounter medications on file as of 11/10/2022.    Allergies (verified) Patient has no known allergies.   History: Past Medical History:  Diagnosis Date   Anemia    Anxiety    Arthritis    Bladder tumor    Breast cancer (HCC) 08/2012   rt mastectomy   GERD (gastroesophageal reflux disease)    Headache    h/o migraines   Hyperlipidemia    Hypertension    Melanoma (HCC)    Myocardial infarction (HCC) 1997   no stents   PONV (postoperative nausea and vomiting)    Pre-diabetes    Past Surgical History:  Procedure Laterality Date   APPENDECTOMY     CHOLECYSTECTOMY     COLONOSCOPY  2014   cleared   COLONOSCOPY WITH PROPOFOL N/A 07/03/2019   Procedure: COLONOSCOPY WITH PROPOFOL;  Surgeon: Toledo, Boykin Nearing, MD;  Location: [facility];  Service: Gastroenterology;  Laterality: N/A;   CYSTOSCOPY  WITH BIOPSY N/A 11/29/2020   Procedure: CYSTOSCOPY WITH BLADDER BIOPSY;  Surgeon: Sondra Come, MD;  Location: [facility];  Service: Urology;  Laterality: N/A;   MASTECTOMY Right 08/2012   TRANSURETHRAL RESECTION OF BLADDER TUMOR WITH MITOMYCIN-C N/A 08/02/2020   Procedure: TRANSURETHRAL RESECTION OF BLADDER TUMOR WITH GEMCITABINE;  Surgeon: Sondra Come, MD;  Location: [facility];  Service: Urology;  Laterality: N/A;   VAGINAL HYSTERECTOMY     Family History  Problem Relation Age of Onset   Breast cancer Sister 30   Brain cancer Daughter    Breast cancer Maternal Aunt 69   Social History   Socioeconomic History    Marital status: Widowed    Spouse name: Not on file   Number of children: 1   Years of education: 12   Highest education level: High school graduate  Occupational History   Occupation: Retired  Tobacco Use   Smoking status: Never    Passive exposure: Never   Smokeless tobacco: Never  Vaping Use   Vaping status: Never Used  Substance and Sexual Activity   Alcohol use: Not Currently   Drug use: Never   Sexual activity: Not Currently    Partners: Male  Other Topics Concern   Not on file  Social History Narrative   Pt lives alone.   Feels safe in her home. Neighbors nearby.   Granddaughter will keep patient after surgery.   Social Determinants of Health   Financial Resource Strain: Low Risk  (11/10/2022)   Overall Financial Resource Strain (CARDIA)    Difficulty of Paying Living Expenses: Not hard at all  Food Insecurity: No Food Insecurity (11/10/2022)   Hunger Vital Sign    Worried About Running Out of Food in the Last Year: Never true    Ran Out of Food in the Last Year: Never true  Transportation Needs: No Transportation Needs (11/10/2022)   PRAPARE - Administrator, Civil Service (Medical): No    Lack of Transportation (Non-Medical): No  Physical Activity: Inactive (11/10/2022)   Exercise Vital Sign    Days of Exercise per Week: 0 days    Minutes of Exercise per Session: 0 min  Stress: No Stress Concern Present (11/10/2022)   [employer] of Occupational Health - Occupational Stress Questionnaire    Feeling of Stress : Not at all  Social Connections: Moderately Integrated (11/10/2022)   Social Connection and Isolation Panel [NHANES]    Frequency of Communication with Friends and Family: More than three times a week    Frequency of Social Gatherings with Friends and Family: More than three times a week    Attends Religious Services: More than 4 times per year    Active Member of [employer] or Organizations: Yes    Attends Banker Meetings:  More than 4 times per year    Marital Status: Widowed    Tobacco Counseling Counseling given: Not Answered   Clinical Intake:  Pre-visit preparation completed: Yes  Pain : No/denies pain     BMI - recorded: 21.46 Nutritional Status: BMI of 19-24  Normal Nutritional Risks: None Diabetes: No  How often do you need to have someone help you when you read instructions, pamphlets, or other written materials from your doctor or pharmacy?: 1 - Never  Interpreter Needed?: No  Information entered by :: Theresa Mulligan LPN   Activities of Daily Living    11/10/2022   10:01 AM  In your present state of health, do you have  any difficulty performing the following activities:  Hearing? 0  Vision? 0  Difficulty concentrating or making decisions? 0  Walking or climbing stairs? 0  Dressing or bathing? 0  Doing errands, shopping? 0  Preparing Food and eating ? N  Using the Toilet? N  In the past six months, have you accidently leaked urine? N  Do you have problems with loss of bowel control? N  Managing your Medications? N  Managing your Finances? N  Housekeeping or managing your Housekeeping? N    Patient Care Team: Duanne Limerick, MD as PCP - General (Family Medicine)  Indicate any recent Medical Services you may have received from other than [facility] providers in the past year (date may be approximate).     Assessment:   This is a routine wellness examination for Amber Galloway.  Hearing/Vision screen Hearing Screening - Comments:: Denies hearing difficulties   Vision Screening - Comments:: Wears rx glasses - up to date with routine eye exams with  Dr Larence Penning   Goals Addressed               This Visit's Progress     Stay Active! (pt-stated)         Depression Screen    11/10/2022   10:00 AM 09/28/2022    9:48 AM 09/03/2022    2:53 PM 06/22/2022    3:35 PM 03/17/2022    9:31 AM 11/05/2021   11:00 AM 09/16/2021    9:07 AM  PHQ 2/9 Scores  PHQ - 2 Score 0 0 0 0 0 0 0  PHQ-  9 Score 0 0 0 0 0 0 0    Fall Risk    11/10/2022   10:02 AM 09/28/2022    9:48 AM 09/03/2022    2:52 PM 06/22/2022    3:34 PM 03/17/2022    9:31 AM  Fall Risk   Falls in the past year? 1 0 1 1 0  Number falls in past yr: 0 0 0 1 0  Injury with Fall? 1 0 1 1 0  Comment Fx: Left arm. Followed by medical attention      Risk for fall due to : No Fall Risks No Fall Risks No Fall Risks No Fall Risks No Fall Risks  Follow up Falls prevention discussed Falls evaluation completed Falls evaluation completed  Falls evaluation completed    MEDICARE RISK AT HOME: Medicare Risk at Home Any stairs in or around the home?: Yes If so, are there any without handrails?: No Home free of loose throw rugs in walkways, pet beds, electrical cords, etc?: Yes Adequate lighting in your home to reduce risk of falls?: Yes Life alert?: No Use of a cane, walker or w/c?: No Grab bars in the bathroom?: No Shower chair or bench in shower?: Yes Elevated toilet seat or a handicapped toilet?: Yes  TIMED UP AND GO:  Was the test performed?  No    Cognitive Function:        11/10/2022   10:04 AM 11/05/2021   11:03 AM  6CIT Screen  What Year? 0 points 0 points  What month? 0 points 0 points  What time? 0 points 0 points  Count back from 20 0 points 2 points  Months in reverse 0 points 0 points  Repeat phrase 0 points 0 points  Total Score 0 points 2 points    Immunizations Immunization History  Administered Date(s) Administered   Fluad Quad(high Dose 65+) 09/12/2018, 10/03/2019, 11/25/2020   Fluad Trivalent(High  Dose 65+) 09/28/2022   Influenza, High Dose Seasonal PF 01/18/2017   Influenza,inj,Quad PF,6+ Mos 11/05/2014, 11/11/2015, 09/14/2017, 09/16/2021   Influenza-Unspecified 11/05/2014, 11/11/2015, 09/14/2017   Moderna Sars-Covid-2 Vaccination 12/27/2019   PFIZER(Purple Top)SARS-COV-2 Vaccination 05/11/2019, 06/06/2019   Pneumococcal Conjugate-13 11/05/2014   Pneumococcal Polysaccharide-23  03/01/2017   Tdap 07/20/2015, 06/11/2022    TDAP status: Up to date  Flu Vaccine status: Up to date  Pneumococcal vaccine status: Up to date  Covid-19 vaccine status: Declined, Education has been provided regarding the importance of this vaccine but patient still declined. Advised may receive this vaccine at local pharmacy or Health Dept.or vaccine clinic. Aware to provide a copy of the vaccination record if obtained from local pharmacy or Health Dept. Verbalized acceptance and understanding.  Qualifies for Shingles Vaccine? Yes   Zostavax completed No   Shingrix Completed?: No.    Education has been provided regarding the importance of this vaccine. Patient has been advised to call insurance company to determine out of pocket expense if they have not yet received this vaccine. Advised may also receive vaccine at local pharmacy or Health Dept. Verbalized acceptance and understanding.  Screening Tests Health Maintenance  Topic Date Due   COVID-19 Vaccine (4 - 2023-24 season) 09/13/2022   Zoster Vaccines- Shingrix (1 of 2) 12/28/2022 (Originally 06/07/1959)   Medicare Annual Wellness (AWV)  11/10/2023   DTaP/Tdap/Td (3 - Td or Tdap) 06/10/2032   Pneumonia Vaccine 64+ Years old  Completed   INFLUENZA VACCINE  Completed   DEXA SCAN  Completed   HPV VACCINES  Aged Out    Health Maintenance  Health Maintenance Due  Topic Date Due   COVID-19 Vaccine (4 - 2023-24 season) 09/13/2022        Bone Density status: Completed 09/22/22. Results reflect: Bone density results: OSTEOPENIA. Repeat every   years.    Additional Screening:    Vision Screening: Recommended annual ophthalmology exams for early detection of glaucoma and other disorders of the eye. Is the patient up to date with their annual eye exam?  Yes  Who is the provider or what is the name of the office in which the patient attends annual eye exams? Dr Larence Penning If pt is not established with a provider, would they like to be  referred to a provider to establish care? No .   Dental Screening: Recommended annual dental exams for proper oral hygiene   Community Resource Referral / Chronic Care Management:  CRR required this visit?  No   CCM required this visit?  No     Plan:     I have personally reviewed and noted the following in the patient's chart:   Medical and social history Use of alcohol, tobacco or illicit drugs  Current medications and supplements including opioid prescriptions. Patient is not currently taking opioid prescriptions. Functional ability and status Nutritional status Physical activity Advanced directives List of other physicians Hospitalizations, surgeries, and ER visits in previous 12 months Vitals Screenings to include cognitive, depression, and falls Referrals and appointments  In addition, I have reviewed and discussed with patient certain preventive protocols, quality metrics, and best practice recommendations. A written personalized care plan for preventive services as well as general preventive health recommendations were provided to patient.     Tillie Rung, LPN   [id number]/10/[id number]   After Visit Summary: (MyChart) Due to this being a telephonic visit, the after visit summary with patients personalized plan was offered to patient via MyChart   Nurse Notes: None

## 2022-11-10 NOTE — Patient Instructions (Addendum)
Ms. Schwaiger , Thank you for taking time to come for your Medicare Wellness Visit. I appreciate your ongoing commitment to your health goals. Please review the following plan we discussed and let me know if I can assist you in the future.   Referrals/Orders/Follow-Ups/Clinician Recommendations:   This is a list of the screening recommended for you and due dates:  Health Maintenance  Topic Date Due   COVID-19 Vaccine (4 - 2023-24 season) 09/13/2022   Zoster (Shingles) Vaccine (1 of 2) 12/28/2022*   Medicare Annual Wellness Visit  11/10/2023   DTaP/Tdap/Td vaccine (3 - Td or Tdap) 06/10/2032   Pneumonia Vaccine  Completed   Flu Shot  Completed   DEXA scan (bone density measurement)  Completed   HPV Vaccine  Aged Out  *Topic was postponed. The date shown is not the original due date.    Advanced directives: (Declined) Advance directive discussed with you today. Even though you declined this today, please call our office should you change your mind, and we can give you the proper paperwork for you to fill out.  Next Medicare Annual Wellness Visit scheduled for next year: Yes

## 2022-11-12 DIAGNOSIS — H4010X1 Unspecified open-angle glaucoma, mild stage: Secondary | ICD-10-CM | POA: Diagnosis not present

## 2022-11-12 DIAGNOSIS — H2513 Age-related nuclear cataract, bilateral: Secondary | ICD-10-CM | POA: Diagnosis not present

## 2022-11-26 ENCOUNTER — Ambulatory Visit: Payer: Medicare HMO | Admitting: Urology

## 2022-11-26 ENCOUNTER — Encounter: Payer: Self-pay | Admitting: Urology

## 2022-11-26 VITALS — BP 158/72 | HR 93 | Ht 64.0 in | Wt 125.0 lb

## 2022-11-26 DIAGNOSIS — Z8551 Personal history of malignant neoplasm of bladder: Secondary | ICD-10-CM

## 2022-11-26 NOTE — Progress Notes (Signed)
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  Path HG Ta  -Recurrence 11/2020: Multiple small papillary tumors biopsied and fulgurated, HG Ta  -Induction BCG completed 02/2021 -She deferred mBCG   After informed consent and discussion of the procedure and its risks, SHELISA GRIST was positioned and prepped in the standard fashion. Cystoscopy was performed with a flexible cystoscope. The urethra, bladder neck and entire bladder was visualized in a standard fashion.  Bladder mucosa grossly normal throughout, no abnormalities on retroflexion.   Findings: Normal cystoscopy   Assessment and Plan: RTC 6 month cysto, can space to yearly starting 11/2024  Legrand Rams, MD 11/26/2022

## 2023-02-01 DIAGNOSIS — Z008 Encounter for other general examination: Secondary | ICD-10-CM | POA: Diagnosis not present

## 2023-02-08 DIAGNOSIS — H4010X1 Unspecified open-angle glaucoma, mild stage: Secondary | ICD-10-CM | POA: Diagnosis not present

## 2023-02-08 DIAGNOSIS — H2513 Age-related nuclear cataract, bilateral: Secondary | ICD-10-CM | POA: Diagnosis not present

## 2023-03-21 IMAGING — CR DG KNEE COMPLETE 4+V*L*
4 series · 4 of 4 positions shown · non-contrast
Comparison: None.

CLINICAL DATA: 80-year-old female with left knee pain.

EXAM:
LEFT KNEE - COMPLETE 4+ VIEW

[knee ap]
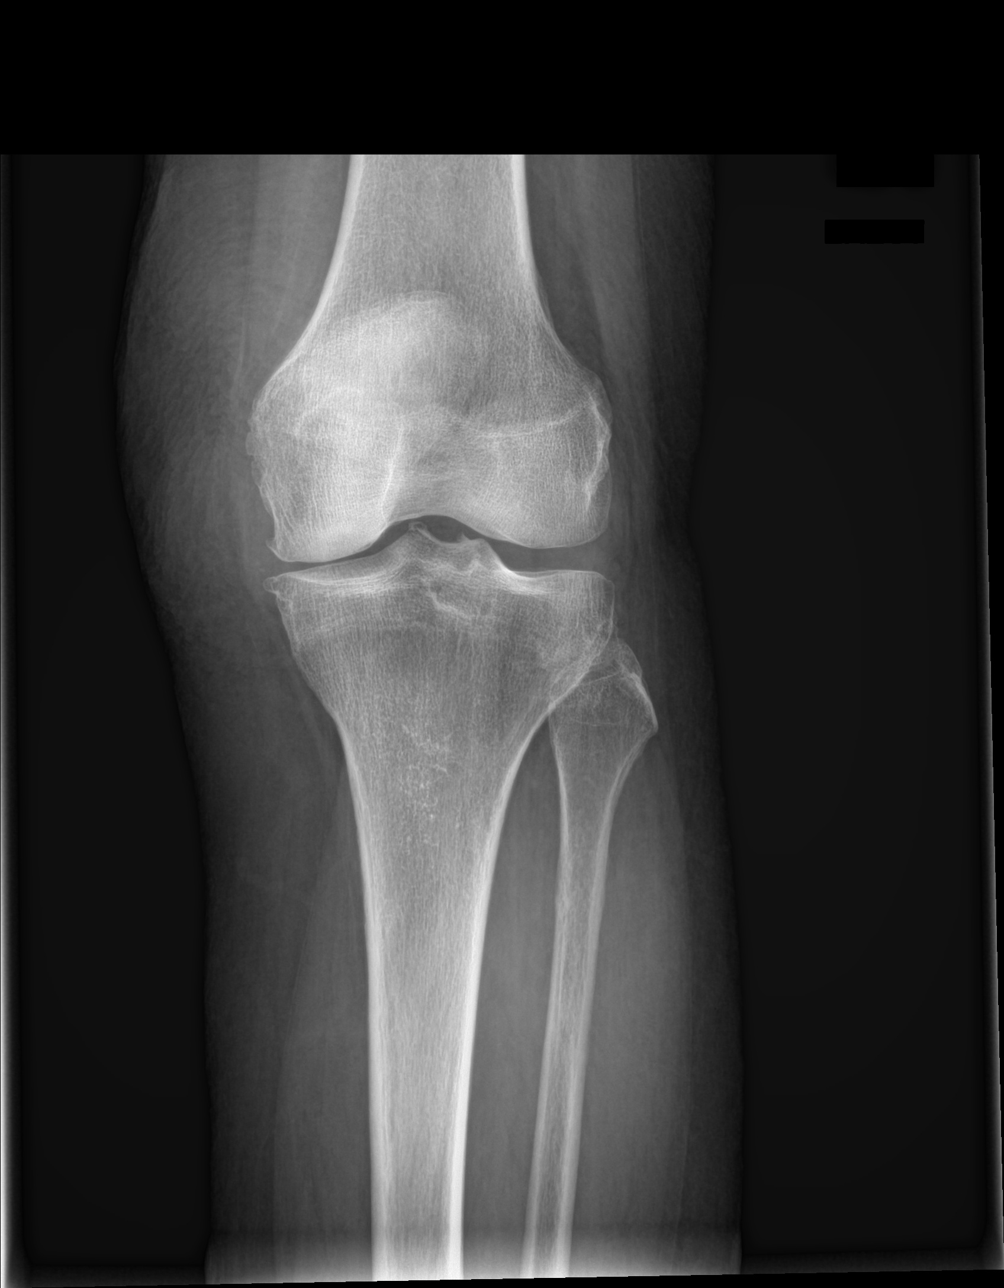

[knee lat]
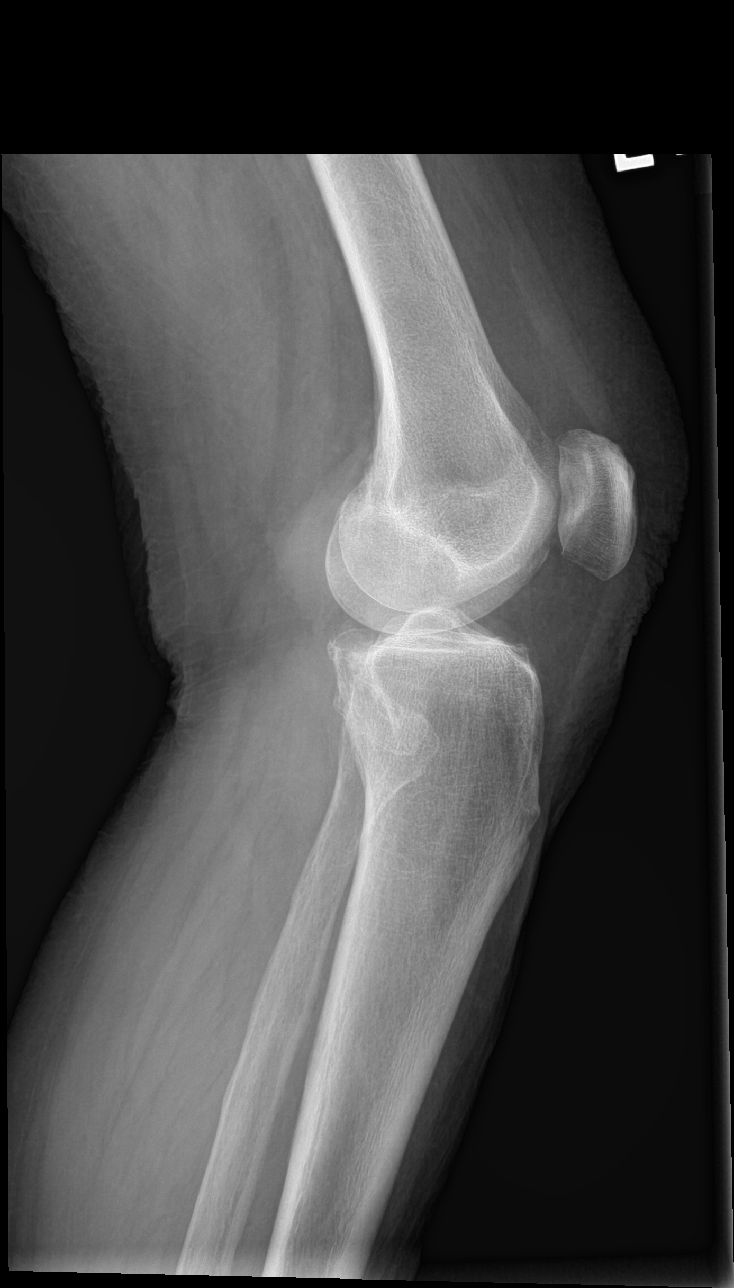

[tunnel]
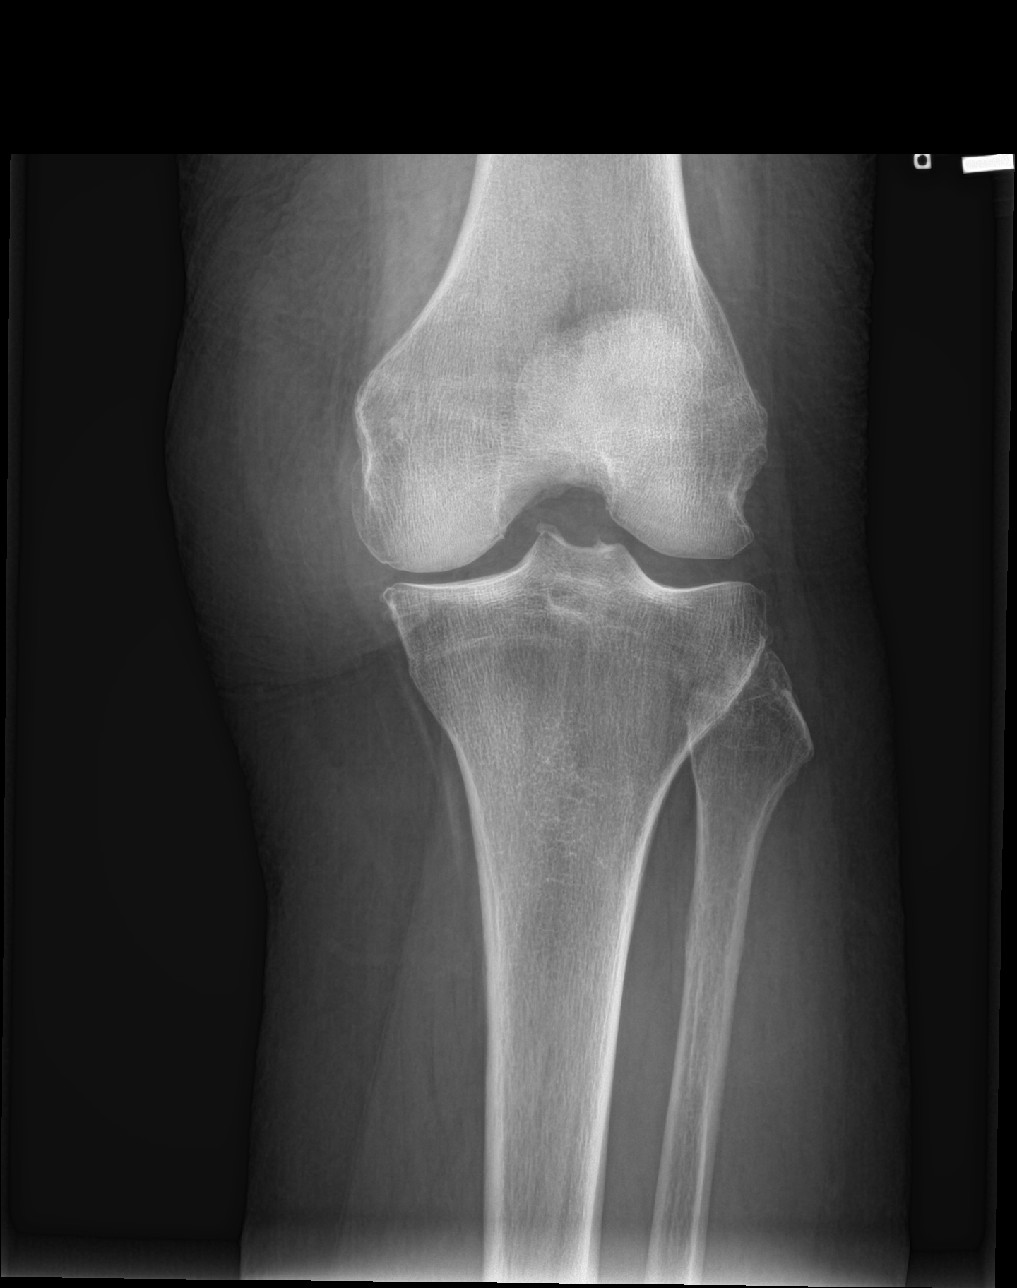

[patella skyline]
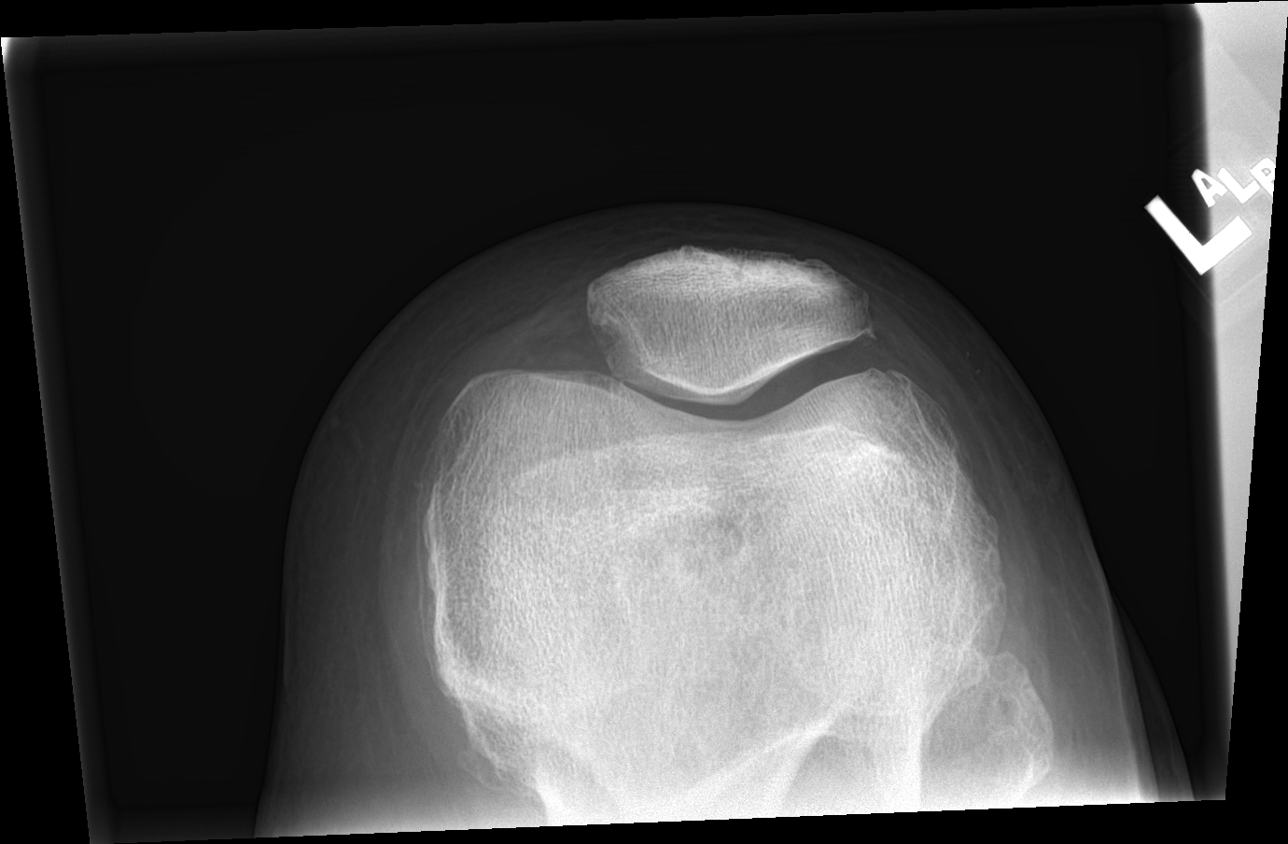

[4 of 4 positions shown; findings below may reference images not displayed]

FINDINGS: There is no acute fracture or dislocation. The bones are osteopenic.
There is arthritic changes of the left knee with moderate narrowing
the medial compartment and spurring. No joint effusion. The soft
tissues are grossly unremarkable.
IMPRESSION: 1. No acute fracture or dislocation.
2. Osteopenia with arthritic changes of the left knee.

## 2023-03-29 ENCOUNTER — Ambulatory Visit: Payer: Medicare HMO | Admitting: Family Medicine

## 2023-03-30 ENCOUNTER — Ambulatory Visit (INDEPENDENT_AMBULATORY_CARE_PROVIDER_SITE_OTHER): Admitting: Family Medicine

## 2023-03-30 ENCOUNTER — Encounter: Payer: Self-pay | Admitting: Family Medicine

## 2023-03-30 DIAGNOSIS — I10 Essential (primary) hypertension: Secondary | ICD-10-CM

## 2023-03-30 DIAGNOSIS — E782 Mixed hyperlipidemia: Secondary | ICD-10-CM | POA: Diagnosis not present

## 2023-03-30 DIAGNOSIS — K219 Gastro-esophageal reflux disease without esophagitis: Secondary | ICD-10-CM

## 2023-03-30 MED ORDER — OMEPRAZOLE 20 MG PO CPDR: 20.0000 mg | DELAYED_RELEASE_CAPSULE | Freq: Every day | ORAL | 1 refills | Status: DC

## 2023-03-30 MED ORDER — METOPROLOL TARTRATE 50 MG PO TABS
50.0000 mg | ORAL_TABLET | Freq: Two times a day (BID) | ORAL | 1 refills | Status: DC
Start: 2023-03-30 — End: 2023-04-27

## 2023-03-30 MED ORDER — HYDROCHLOROTHIAZIDE 12.5 MG PO CAPS
ORAL_CAPSULE | ORAL | 1 refills | Status: DC
Start: 2023-03-30 — End: 2023-04-27

## 2023-03-30 NOTE — Progress Notes (Signed)
 Date:  03/30/2023   Name:  Amber Galloway   DOB:  1940-02-07   MRN:  [id number]   Chief Complaint: Hypertension (Patient presents today for a follow up on her HTN. She has been taking her medication as directed and she does not have any concerns for today's visit. )  Hypertension This is a chronic problem. The current episode started more than 1 year ago. The problem has been gradually improving since onset. The problem is controlled. Pertinent negatives include no anxiety, blurred vision, chest pain, headaches, malaise/fatigue, neck pain, orthopnea, palpitations, peripheral edema, PND, shortness of breath or sweats. There are no associated agents to hypertension. There are no known risk factors for coronary artery disease. Past treatments include diuretics and beta blockers. The current treatment provides moderate improvement. There are no compliance problems.  There is no history of angina, CAD/MI or CVA. There is no history of chronic renal disease, a hypertension causing med or renovascular disease.  Hyperlipidemia This is a chronic problem. The current episode started more than 1 year ago. The problem is controlled. Recent lipid tests were reviewed and are normal. She has no history of chronic renal disease. There are no known factors aggravating her hyperlipidemia. Pertinent negatives include no chest pain or shortness of breath. Current antihyperlipidemic treatment includes statins. The current treatment provides moderate improvement of lipids. There are no compliance problems.  Risk factors for coronary artery disease include dyslipidemia and hypertension.  Gastroesophageal Reflux She reports no abdominal pain, no chest pain, no choking, no dysphagia, no early satiety, no heartburn, no nausea or no wheezing. The current episode started more than 1 year ago. The problem has been gradually improving. She has tried a PPI for the symptoms. The treatment provided moderate relief.    Lab Results   Component Value Date   NA 141 09/28/2022   K 4.2 09/28/2022   CO2 25 09/28/2022   GLUCOSE 102 (H) 09/28/2022   BUN 18 09/28/2022   CREATININE 0.74 09/28/2022   CALCIUM 10.0 09/28/2022   EGFR 80 09/28/2022   GFRNONAA >60 11/28/2020   Lab Results  Component Value Date   CHOL 154 10/23/2022   HDL 55 10/23/2022   LDLCALC 79 10/23/2022   TRIG 109 10/23/2022   CHOLHDL 2.8 10/23/2022   No results found for: "TSH" Lab Results  Component Value Date   HGBA1C 6.0 (H) 09/28/2022   Lab Results  Component Value Date   WBC 8.0 11/28/2020   HGB 13.2 11/28/2020   HCT 38.6 11/28/2020   MCV 87.1 11/28/2020   PLT 268 11/28/2020   Lab Results  Component Value Date   ALT 13 09/28/2022   AST 26 09/28/2022   ALKPHOS 87 09/28/2022   BILITOT 0.8 09/28/2022   No results found for: "25OHVITD2", "25OHVITD3", "VD25OH"   Review of Systems  Constitutional:  Negative for malaise/fatigue.  Eyes:  Negative for blurred vision and photophobia.  Respiratory:  Negative for choking, shortness of breath and wheezing.   Cardiovascular:  Negative for chest pain, palpitations, orthopnea and PND.  Gastrointestinal:  Positive for constipation. Negative for abdominal pain, dysphagia, heartburn and nausea.  Musculoskeletal:  Negative for neck pain.  Neurological:  Negative for headaches.    Patient Active Problem List   Diagnosis Date Noted   Primary osteoarthritis of left knee 06/20/2020   Pain and swelling of left lower leg 06/20/2020   Pelvic pain 10/25/2019   Acute anxiety 08/10/2017   Overweight (BMI 25.0-29.9) 03/01/2017   Dizziness  06/05/2016   Essential hypertension 06/06/2015   Mixed hyperlipidemia 06/06/2015   Gastroesophageal reflux disease without esophagitis 06/06/2015   History of melanoma in situ 05/21/2014    No Known Allergies  Past Surgical History:  Procedure Laterality Date   APPENDECTOMY     CHOLECYSTECTOMY     COLONOSCOPY  2014   cleared   COLONOSCOPY WITH PROPOFOL  N/A 07/03/2019   Procedure: COLONOSCOPY WITH PROPOFOL;  Surgeon: Toledo, Boykin Nearing, MD;  Location: [facility];  Service: Gastroenterology;  Laterality: N/A;   CYSTOSCOPY WITH BIOPSY N/A 11/29/2020   Procedure: CYSTOSCOPY WITH BLADDER BIOPSY;  Surgeon: Sondra Come, MD;  Location: [facility];  Service: Urology;  Laterality: N/A;   MASTECTOMY Right 08/2012   TRANSURETHRAL RESECTION OF BLADDER TUMOR WITH MITOMYCIN-C N/A 08/02/2020   Procedure: TRANSURETHRAL RESECTION OF BLADDER TUMOR WITH GEMCITABINE;  Surgeon: Sondra Come, MD;  Location: [facility];  Service: Urology;  Laterality: N/A;   VAGINAL HYSTERECTOMY      Social History   Tobacco Use   Smoking status: Never    Passive exposure: Never   Smokeless tobacco: Never  Vaping Use   Vaping status: Never Used  Substance Use Topics   Alcohol use: Not Currently   Drug use: Never     Medication list has been reviewed and updated.  Current Meds  Medication Sig   aspirin EC 81 MG tablet Take 81 mg by mouth in the morning.   hydrochlorothiazide (MICROZIDE) 12.5 MG capsule TAKE 1 CAPSULE BY MOUTH EVERY DAY   latanoprost (XALATAN) 0.005 % ophthalmic solution Place 1 drop into both eyes daily.   metoprolol tartrate (LOPRESSOR) 50 MG tablet Take 1 tablet (50 mg total) by mouth 2 (two) times daily.   omeprazole (PRILOSEC) 20 MG capsule Take 1 capsule (20 mg total) by mouth daily.   simvastatin (ZOCOR) 80 MG tablet TAKE 1 TABLET BY MOUTH EVERYDAY AT BEDTIME       03/30/2023    9:25 AM 09/28/2022    9:48 AM 09/03/2022    2:53 PM 06/22/2022    3:35 PM  GAD 7 : Generalized Anxiety Score  Nervous, Anxious, on Edge 0 0 0 0  Control/stop worrying 0 0 0 0  Worry too much - different things 0 0 0 0  Trouble relaxing 0 0 0 0  Restless 0 0 0 0  Easily annoyed or irritable 0 0 0 0  Afraid - awful might happen 0 0 0 0  Total GAD 7 Score 0 0 0 0  Anxiety Difficulty Not difficult at all Not difficult at all Not difficult at all Not difficult  at all       03/30/2023    9:25 AM 11/10/2022   10:00 AM 09/28/2022    9:48 AM  Depression screen PHQ 2/9  Decreased Interest 0 0 0  Down, Depressed, Hopeless 0 0 0  PHQ - 2 Score 0 0 0  Altered sleeping 0 0 0  Tired, decreased energy 1 0 0  Change in appetite 0 0 0  Feeling bad or failure about yourself  0 0 0  Trouble concentrating 0 0 0  Moving slowly or fidgety/restless 0 0 0  Suicidal thoughts 0 0 0  PHQ-9 Score 1 0 0  Difficult doing work/chores Not difficult at all Not difficult at all Not difficult at all    BP Readings from Last 3 Encounters:  03/30/23 120/60  11/26/22 (!) 158/72  09/28/22 124/70    Physical Exam Vitals  and nursing note reviewed.  Constitutional:      General: She is not in acute distress.    Appearance: She is not diaphoretic.  HENT:     Head: Normocephalic and atraumatic.     Right Ear: External ear normal.     Left Ear: External ear normal.     Nose: Nose normal.  Eyes:     General:        Right eye: No discharge.        Left eye: No discharge.     Conjunctiva/sclera: Conjunctivae normal.     Pupils: Pupils are equal, round, and reactive to light.  Neck:     Thyroid: No thyromegaly.     Vascular: No JVD.  Cardiovascular:     Rate and Rhythm: Normal rate and regular rhythm.     Heart sounds: Normal heart sounds. No murmur heard.    No friction rub. No gallop.  Pulmonary:     Effort: Pulmonary effort is normal.     Breath sounds: Normal breath sounds.  Abdominal:     General: Bowel sounds are normal.     Palpations: Abdomen is soft. There is no mass.     Tenderness: There is no abdominal tenderness. There is no guarding.  Musculoskeletal:        General: Normal range of motion.     Cervical back: Normal range of motion and neck supple.  Lymphadenopathy:     Cervical: No cervical adenopathy.  Skin:    General: Skin is warm and dry.  Neurological:     Mental Status: She is alert.     Deep Tendon Reflexes: Reflexes are normal  and symmetric.     Wt Readings from Last 3 Encounters:  03/30/23 130 lb 6.4 oz (59.1 kg)  11/26/22 125 lb (56.7 kg)  11/10/22 125 lb (56.7 kg)    BP 120/60   Pulse 69   Ht 5\' 4"  (1.626 m)   Wt 130 lb 6.4 oz (59.1 kg)   SpO2 94%   BMI 22.38 kg/m   Assessment and Plan: 1. Essential hypertension Chronic.  Controlled.  Stable.  Blood pressure 120/60.  Asymptomatic.  Tolerating medications well.  Continue hydrochlorothiazide 12.5 mg once a day and metoprolol 50 mg twice a day.  Will recheck patient in 6 months. - hydrochlorothiazide (MICROZIDE) 12.5 MG capsule; TAKE 1 CAPSULE BY MOUTH EVERY DAY  Dispense: 90 capsule; Refill: 1 - metoprolol tartrate (LOPRESSOR) 50 MG tablet; Take 1 tablet (50 mg total) by mouth 2 (two) times daily.  Dispense: 180 tablet; Refill: 1  2. Gastroesophageal reflux disease without esophagitis Chronic.  Controlled.  Stable.  Asymptomatic on current dosing of omeprazole.  Continue with current dosing of 20 mg once a day and will recheck in months. - omeprazole (PRILOSEC) 20 MG capsule; Take 1 capsule (20 mg total) by mouth daily.  Dispense: 90 capsule; Refill: 1  3. Mixed hyperlipidemia Chronic.  Controlled.  Unable to prescribe simvastatin due to computer not allowing given her age and concerns about muscle involvement.  We will reemphasize diet and patient will return and we would do lab work for hypertension and hyperlipidemia in a fasting state in 4 weeks where she is not in currently at that time.    Elizabeth Sauer, MD

## 2023-03-30 NOTE — Patient Instructions (Signed)

## 2023-04-06 DIAGNOSIS — H401131 Primary open-angle glaucoma, bilateral, mild stage: Secondary | ICD-10-CM | POA: Diagnosis not present

## 2023-04-06 DIAGNOSIS — H2513 Age-related nuclear cataract, bilateral: Secondary | ICD-10-CM | POA: Diagnosis not present

## 2023-04-06 DIAGNOSIS — H43812 Vitreous degeneration, left eye: Secondary | ICD-10-CM | POA: Diagnosis not present

## 2023-04-06 DIAGNOSIS — H538 Other visual disturbances: Secondary | ICD-10-CM | POA: Diagnosis not present

## 2023-04-06 DIAGNOSIS — H25013 Cortical age-related cataract, bilateral: Secondary | ICD-10-CM | POA: Diagnosis not present

## 2023-04-20 NOTE — Anesthesia Preprocedure Evaluation (Addendum)
 Anesthesia Evaluation  Patient identified by MRN, date of birth, ID band Patient awake    Reviewed: Allergy & Precautions, H&P , NPO status , Patient's Chart, lab work & pertinent test results  Airway Mallampati: II  TM Distance: <3 FB Neck ROM: full    Dental  (+) Edentulous Upper, Poor Dentition   Pulmonary neg pulmonary ROS   Pulmonary exam normal        Cardiovascular hypertension, + Past MI  Normal cardiovascular exam     Neuro/Psych   Anxiety     negative neurological ROS  negative psych ROS   GI/Hepatic Neg liver ROS,GERD  Controlled,,  Endo/Other  negative endocrine ROS    Renal/GU      Musculoskeletal   Abdominal Normal abdominal exam  (+)   Peds  Hematology negative hematology ROS (+)   Anesthesia Other Findings 11-29-20 had cystoscopy with Dr. Paz Bott anesthesiologist   Hypertension  Hyperlipidemia GERD (gastroesophageal reflux disease) Melanoma  Breast cancer   Myocardial infarction 1997, no stents PONV (postoperative nausea and vomiting) Anxiety Pre-diabetes  Headache Bladder tumor  Anemia Arthritis      Reproductive/Obstetrics negative OB ROS                             Anesthesia Physical Anesthesia Plan  ASA: 3  Anesthesia Plan: MAC   Post-op Pain Management:    Induction: Intravenous  PONV Risk Score and Plan:   Airway Management Planned: Natural Airway and Nasal Cannula  Additional Equipment:   Intra-op Plan:   Post-operative Plan:   Informed Consent: I have reviewed the patients History and Physical, chart, labs and discussed the procedure including the risks, benefits and alternatives for the proposed anesthesia with the patient or authorized representative who has indicated his/her understanding and acceptance.     Dental Advisory Given  Plan Discussed with: Anesthesiologist, CRNA and Surgeon  Anesthesia Plan Comments:          Anesthesia Quick Evaluation

## 2023-04-27 ENCOUNTER — Encounter: Payer: Self-pay | Admitting: Family Medicine

## 2023-04-27 ENCOUNTER — Ambulatory Visit (INDEPENDENT_AMBULATORY_CARE_PROVIDER_SITE_OTHER): Admitting: Family Medicine

## 2023-04-27 VITALS — BP 126/72 | HR 66 | Resp 16 | Ht 64.0 in | Wt 126.0 lb

## 2023-04-27 DIAGNOSIS — K219 Gastro-esophageal reflux disease without esophagitis: Secondary | ICD-10-CM

## 2023-04-27 DIAGNOSIS — H2513 Age-related nuclear cataract, bilateral: Secondary | ICD-10-CM | POA: Diagnosis not present

## 2023-04-27 DIAGNOSIS — Z862 Personal history of diseases of the blood and blood-forming organs and certain disorders involving the immune mechanism: Secondary | ICD-10-CM | POA: Diagnosis not present

## 2023-04-27 DIAGNOSIS — H25012 Cortical age-related cataract, left eye: Secondary | ICD-10-CM | POA: Diagnosis not present

## 2023-04-27 DIAGNOSIS — Z1231 Encounter for screening mammogram for malignant neoplasm of breast: Secondary | ICD-10-CM | POA: Diagnosis not present

## 2023-04-27 DIAGNOSIS — H25011 Cortical age-related cataract, right eye: Secondary | ICD-10-CM | POA: Diagnosis not present

## 2023-04-27 DIAGNOSIS — E782 Mixed hyperlipidemia: Secondary | ICD-10-CM | POA: Diagnosis not present

## 2023-04-27 DIAGNOSIS — I10 Essential (primary) hypertension: Secondary | ICD-10-CM | POA: Diagnosis not present

## 2023-04-27 MED ORDER — HYDROCHLOROTHIAZIDE 12.5 MG PO CAPS: ORAL_CAPSULE | ORAL | 1 refills | Status: DC

## 2023-04-27 MED ORDER — METOPROLOL TARTRATE 50 MG PO TABS: 50.0000 mg | ORAL_TABLET | Freq: Two times a day (BID) | ORAL | 1 refills | Status: DC

## 2023-04-27 MED ORDER — OMEPRAZOLE 20 MG PO CPDR: 20.0000 mg | DELAYED_RELEASE_CAPSULE | Freq: Every day | ORAL | 1 refills | Status: AC

## 2023-04-27 NOTE — Progress Notes (Signed)
 Date:  04/27/2023   Name:  Amber Galloway   DOB:  1940/08/29   MRN:  [id number]   Chief Complaint: Hypertension and Hyperlipidemia  Hypertension This is a chronic problem. The current episode started more than 1 year ago. The problem has been gradually improving since onset. The problem is controlled. Pertinent negatives include no blurred vision, chest pain, headaches, neck pain, orthopnea, palpitations, PND, shortness of breath or sweats. There are no associated agents to hypertension. There are no known risk factors for coronary artery disease. Past treatments include diuretics and beta blockers. The current treatment provides moderate improvement. There are no compliance problems.  Hypertensive end-organ damage includes CAD/MI. There is no history of CVA. There is no history of chronic renal disease, a hypertension causing med or renovascular disease.  Hyperlipidemia This is a chronic problem. The current episode started more than 1 year ago. The problem is controlled. Recent lipid tests were reviewed and are normal. She has no history of chronic renal disease, diabetes, hypothyroidism, liver disease, obesity or nephrotic syndrome. Factors aggravating her hyperlipidemia include thiazides. Pertinent negatives include no chest pain, focal sensory loss, focal weakness, leg pain, myalgias or shortness of breath. Current antihyperlipidemic treatment includes diet change. The current treatment provides moderate improvement of lipids. There are no compliance problems.  Risk factors for coronary artery disease include dyslipidemia and hypertension.  Gastroesophageal Reflux She reports no abdominal pain, no chest pain, no choking, no coughing, no dysphagia, no heartburn, no nausea, no sore throat or no wheezing. This is a chronic problem. The current episode started more than 1 year ago. The problem has been gradually improving (since dental palte fixed). The symptoms are aggravated by certain foods.  Pertinent negatives include no anemia, fatigue or melena.    Lab Results  Component Value Date   NA 141 09/28/2022   K 4.2 09/28/2022   CO2 25 09/28/2022   GLUCOSE 102 (H) 09/28/2022   BUN 18 09/28/2022   CREATININE 0.74 09/28/2022   CALCIUM 10.0 09/28/2022   EGFR 80 09/28/2022   GFRNONAA >60 11/28/2020   Lab Results  Component Value Date   CHOL 154 10/23/2022   HDL 55 10/23/2022   LDLCALC 79 10/23/2022   TRIG 109 10/23/2022   CHOLHDL 2.8 10/23/2022   No results found for: "TSH" Lab Results  Component Value Date   HGBA1C 6.0 (H) 09/28/2022   Lab Results  Component Value Date   WBC 8.0 11/28/2020   HGB 13.2 11/28/2020   HCT 38.6 11/28/2020   MCV 87.1 11/28/2020   PLT 268 11/28/2020   Lab Results  Component Value Date   ALT 13 09/28/2022   AST 26 09/28/2022   ALKPHOS 87 09/28/2022   BILITOT 0.8 09/28/2022   No results found for: "25OHVITD2", "25OHVITD3", "VD25OH"   Review of Systems  Constitutional:  Negative for fatigue and unexpected weight change.  HENT:  Negative for sore throat and trouble swallowing.   Eyes:  Negative for blurred vision.  Respiratory:  Negative for cough, choking, chest tightness, shortness of breath and wheezing.   Cardiovascular:  Negative for chest pain, palpitations, orthopnea, leg swelling and PND.  Gastrointestinal:  Negative for abdominal pain, dysphagia, heartburn, melena and nausea.  Musculoskeletal:  Negative for myalgias and neck pain.  Neurological:  Negative for focal weakness and headaches.    Patient Active Problem List   Diagnosis Date Noted   Primary osteoarthritis of left knee 06/20/2020   Pain and swelling of left lower leg  06/20/2020   Pelvic pain 10/25/2019   Acute anxiety 08/10/2017   Overweight (BMI 25.0-29.9) 03/01/2017   Dizziness 06/05/2016   Essential hypertension 06/06/2015   Mixed hyperlipidemia 06/06/2015   Gastroesophageal reflux disease without esophagitis 06/06/2015   History of melanoma in situ  05/21/2014    No Known Allergies  Past Surgical History:  Procedure Laterality Date   APPENDECTOMY     CHOLECYSTECTOMY     COLONOSCOPY  2014   cleared   COLONOSCOPY WITH PROPOFOL N/A 07/03/2019   Procedure: COLONOSCOPY WITH PROPOFOL;  Surgeon: Toledo, Alphonsus Jeans, MD;  Location: [facility];  Service: Gastroenterology;  Laterality: N/A;   CYSTOSCOPY WITH BIOPSY N/A 11/29/2020   Procedure: CYSTOSCOPY WITH BLADDER BIOPSY;  Surgeon: Lawerence Pressman, MD;  Location: [facility];  Service: Urology;  Laterality: N/A;   MASTECTOMY Right 08/2012   TRANSURETHRAL RESECTION OF BLADDER TUMOR WITH MITOMYCIN-C N/A 08/02/2020   Procedure: TRANSURETHRAL RESECTION OF BLADDER TUMOR WITH GEMCITABINE;  Surgeon: Lawerence Pressman, MD;  Location: [facility];  Service: Urology;  Laterality: N/A;   VAGINAL HYSTERECTOMY      Social History   Tobacco Use   Smoking status: Never    Passive exposure: Never   Smokeless tobacco: Never  Vaping Use   Vaping status: Never Used  Substance Use Topics   Alcohol use: Not Currently   Drug use: Never     Medication list has been reviewed and updated.  Current Meds  Medication Sig   acetaminophen (TYLENOL) 325 MG tablet Take 325-650 mg by mouth every 6 (six) hours as needed (pain.).   aspirin EC 81 MG tablet Take 81 mg by mouth in the morning.   hydrochlorothiazide (MICROZIDE) 12.5 MG capsule TAKE 1 CAPSULE BY MOUTH EVERY DAY   latanoprost (XALATAN) 0.005 % ophthalmic solution Place 1 drop into both eyes daily.   metoprolol tartrate (LOPRESSOR) 50 MG tablet Take 1 tablet (50 mg total) by mouth 2 (two) times daily.   omeprazole (PRILOSEC) 20 MG capsule Take 1 capsule (20 mg total) by mouth daily.       04/27/2023   10:02 AM 03/30/2023    9:25 AM 09/28/2022    9:48 AM 09/03/2022    2:53 PM  GAD 7 : Generalized Anxiety Score  Nervous, Anxious, on Edge 0 0 0 0  Control/stop worrying 0 0 0 0  Worry too much - different things 0 0 0 0  Trouble relaxing 0 0 0 0   Restless 0 0 0 0  Easily annoyed or irritable 0 0 0 0  Afraid - awful might happen 0 0 0 0  Total GAD 7 Score 0 0 0 0  Anxiety Difficulty  Not difficult at all Not difficult at all Not difficult at all       04/27/2023   10:01 AM 03/30/2023    9:25 AM 11/10/2022   10:00 AM  Depression screen PHQ 2/9  Decreased Interest 0 0 0  Down, Depressed, Hopeless 0 0 0  PHQ - 2 Score 0 0 0  Altered sleeping  0 0  Tired, decreased energy  1 0  Change in appetite  0 0  Feeling bad or failure about yourself   0 0  Trouble concentrating  0 0  Moving slowly or fidgety/restless  0 0  Suicidal thoughts  0 0  PHQ-9 Score  1 0  Difficult doing work/chores  Not difficult at all Not difficult at all    BP Readings from Last 3 Encounters:  04/27/23 126/72  03/30/23 120/60  11/26/22 (!) 158/72    Physical Exam Vitals and nursing note reviewed.  Constitutional:      General: She is not in acute distress.    Appearance: She is not diaphoretic.  HENT:     Head: Normocephalic and atraumatic.     Right Ear: External ear normal.     Left Ear: External ear normal.     Nose: Nose normal.     Mouth/Throat:     Mouth: Mucous membranes are dry.  Eyes:     General:        Right eye: No discharge.        Left eye: No discharge.     Conjunctiva/sclera: Conjunctivae normal.     Pupils: Pupils are equal, round, and reactive to light.  Neck:     Thyroid: No thyromegaly.     Vascular: No JVD.  Cardiovascular:     Rate and Rhythm: Normal rate and regular rhythm.     Heart sounds: Normal heart sounds. No murmur heard.    No friction rub. No gallop.  Pulmonary:     Effort: Pulmonary effort is normal.     Breath sounds: Normal breath sounds. No wheezing, rhonchi or rales.  Abdominal:     General: Bowel sounds are normal.     Palpations: Abdomen is soft. There is no mass.     Tenderness: There is no abdominal tenderness. There is no guarding.  Musculoskeletal:        General: Normal range of  motion.     Cervical back: Normal range of motion and neck supple.  Lymphadenopathy:     Cervical: No cervical adenopathy.  Skin:    General: Skin is warm and dry.  Neurological:     Mental Status: She is alert.     Deep Tendon Reflexes: Reflexes are normal and symmetric.     Wt Readings from Last 3 Encounters:  04/27/23 126 lb (57.2 kg)  03/30/23 130 lb 6.4 oz (59.1 kg)  11/26/22 125 lb (56.7 kg)    BP 126/72   Pulse 66   Resp 16   Ht 5\' 4"  (1.626 m)   Wt 126 lb (57.2 kg)   SpO2 99%   BMI 21.63 kg/m   Assessment and Plan: 1. Essential hypertension (Primary) Chronic.  Controlled.  Stable.  Asymptomatic.  Tolerating medications well.  Today's blood pressure is 126/72.  Continue hydrochlorothiazide 12.5 mg once a day and metoprolol 50 mg twice a day.  Will check CMP for electrolytes and GFR.  Will recheck patient in 6 months. - hydrochlorothiazide (MICROZIDE) 12.5 MG capsule; TAKE 1 CAPSULE BY MOUTH EVERY DAY  Dispense: 90 capsule; Refill: 1 - metoprolol tartrate (LOPRESSOR) 50 MG tablet; Take 1 tablet (50 mg total) by mouth 2 (two) times daily.  Dispense: 180 tablet; Refill: 1 - CBC with Differential/Platelet - Lipid Panel With LDL/HDL Ratio - Comprehensive metabolic panel with GFR  2. Gastroesophageal reflux disease without esophagitis Chronic.  Controlled.  Stable.  Symptomatic control of reflux with omeprazole 20 mg once a day.  Patient denies any hematochezia or hematemesis at this time. - omeprazole (PRILOSEC) 20 MG capsule; Take 1 capsule (20 mg total) by mouth daily.  Dispense: 90 capsule; Refill: 1  3. Mixed hyperlipidemia Chronic.  Stable.  Controlled.  Patient is currently just dietary control and we are currently off of statin and we are rechecking lipid panel for current level of control with patient self control of dietary  intake of triglycerides and cholesterol.  4. History of anemia Patient with history of lower GI bleed which she attributes to colitis but I  do not see any evidence of that in the evaluation.  We will check CBC to rule out any chronic GI blood loss.  5. Screening mammogram for breast cancer Patient has letter for screening mammogram desires to go ahead and schedule for this and we will place order therefore. - MM 3D SCREENING MAMMOGRAM BILATERAL BREAST     Alayne Allis, MD

## 2023-04-28 ENCOUNTER — Encounter: Payer: Self-pay | Admitting: Family Medicine

## 2023-04-28 LAB — LIPID PANEL WITH LDL/HDL RATIO
Cholesterol, Total: 271 mg/dL — ABNORMAL HIGH (ref 100–199)
HDL: 55 mg/dL (ref >39)
LDL Chol Calc (NIH): 180 mg/dL — ABNORMAL HIGH (ref 0–99)
LDL/HDL Ratio: 3.3 ratio — ABNORMAL HIGH (ref 0.0–3.2)
Triglycerides: 194 mg/dL — ABNORMAL HIGH (ref 0–149)
VLDL Cholesterol Cal: 36 mg/dL (ref 5–40)

## 2023-04-28 LAB — CBC WITH DIFFERENTIAL/PLATELET
Basophils Absolute: 0 10*3/uL (ref 0.0–0.2)
Basos: 1 %
EOS (ABSOLUTE): 0.2 10*3/uL (ref 0.0–0.4)
Eos: 2 %
Hematocrit: 41.3 % (ref 34.0–46.6)
Hemoglobin: 13.9 g/dL (ref 11.1–15.9)
Immature Grans (Abs): 0 10*3/uL (ref 0.0–0.1)
Immature Granulocytes: 0 %
Lymphocytes Absolute: 1.7 10*3/uL (ref 0.7–3.1)
Lymphs: 26 %
MCH: 30.1 pg (ref 26.6–33.0)
MCHC: 33.7 g/dL (ref 31.5–35.7)
MCV: 89 fL (ref 79–97)
Monocytes Absolute: 0.5 10*3/uL (ref 0.1–0.9)
Monocytes: 8 %
Neutrophils Absolute: 4.1 10*3/uL (ref 1.4–7.0)
Neutrophils: 63 %
Platelets: 169 10*3/uL (ref 150–450)
RBC: 4.62 x10E6/uL (ref 3.77–5.28)
RDW: 12.4 % (ref 11.7–15.4)
WBC: 6.5 10*3/uL (ref 3.4–10.8)

## 2023-04-28 LAB — COMPREHENSIVE METABOLIC PANEL WITH GFR
ALT: 9 IU/L (ref 0–32)
AST: 20 IU/L (ref 0–40)
Albumin: 4.5 g/dL (ref 3.7–4.7)
Alkaline Phosphatase: 81 IU/L (ref 44–121)
BUN/Creatinine Ratio: 22 (ref 12–28)
BUN: 18 mg/dL (ref 8–27)
Bilirubin Total: 0.8 mg/dL (ref 0.0–1.2)
CO2: 26 mmol/L (ref 20–29)
Calcium: 10.3 mg/dL (ref 8.7–10.3)
Chloride: 99 mmol/L (ref 96–106)
Creatinine, Ser: 0.83 mg/dL (ref 0.57–1.00)
Globulin, Total: 2.3 g/dL (ref 1.5–4.5)
Glucose: 102 mg/dL — ABNORMAL HIGH (ref 70–99)
Potassium: 4.5 mmol/L (ref 3.5–5.2)
Sodium: 141 mmol/L (ref 134–144)
Total Protein: 6.8 g/dL (ref 6.0–8.5)
eGFR: 69 mL/min/{1.73_m2} (ref >59)

## 2023-04-28 NOTE — Patient Instructions (Signed)

## 2023-05-03 ENCOUNTER — Ambulatory Visit
Admission: RE | Admit: 2023-05-03 | Discharge: 2023-05-03 | Disposition: A | Discharge status: Home / Self Care | Attending: Ophthalmology | Admitting: Ophthalmology

## 2023-05-03 ENCOUNTER — Other Ambulatory Visit: Payer: Self-pay

## 2023-05-03 ENCOUNTER — Encounter: Payer: Self-pay | Admitting: Ophthalmology

## 2023-05-03 ENCOUNTER — Ambulatory Visit: Payer: Self-pay | Admitting: Anesthesiology

## 2023-05-03 ENCOUNTER — Encounter
Admission: RE | Disposition: A | Discharge status: Home / Self Care | Payer: Self-pay | Source: Home / Self Care | Attending: Ophthalmology

## 2023-05-03 DIAGNOSIS — I10 Essential (primary) hypertension: Secondary | ICD-10-CM | POA: Insufficient documentation

## 2023-05-03 DIAGNOSIS — H2512 Age-related nuclear cataract, left eye: Secondary | ICD-10-CM | POA: Insufficient documentation

## 2023-05-03 DIAGNOSIS — K219 Gastro-esophageal reflux disease without esophagitis: Secondary | ICD-10-CM | POA: Insufficient documentation

## 2023-05-03 DIAGNOSIS — I252 Old myocardial infarction: Secondary | ICD-10-CM | POA: Diagnosis not present

## 2023-05-03 DIAGNOSIS — H25012 Cortical age-related cataract, left eye: Secondary | ICD-10-CM | POA: Diagnosis not present

## 2023-05-03 HISTORY — PX: CATARACT EXTRACTION W/PHACO: SHX586

## 2023-05-03 SURGERY — PHACOEMULSIFICATION, CATARACT, WITH IOL INSERTION
Anesthesia: Monitor Anesthesia Care | Site: Eye | Laterality: Left

## 2023-05-03 MED ORDER — MIDAZOLAM HCL 2 MG/2ML IJ SOLN
INTRAMUSCULAR | Status: DC | PRN
  Administered 2023-05-03: .5 mg via INTRAVENOUS

## 2023-05-03 MED ORDER — SIGHTPATH DOSE#1 BSS IO SOLN
INTRAOCULAR | Status: DC | PRN
  Administered 2023-05-03: 15 mL via INTRAOCULAR

## 2023-05-03 MED ORDER — MIDAZOLAM HCL 2 MG/2ML IJ SOLN
INTRAMUSCULAR | Status: AC
  Filled 2023-05-03: qty 2

## 2023-05-03 MED ORDER — ARMC OPHTHALMIC DILATING DROPS
1.0000 | OPHTHALMIC | Status: DC | PRN
  Administered 2023-05-03 (×3): 1 via OPHTHALMIC

## 2023-05-03 MED ORDER — LIDOCAINE HCL (PF) 2 % IJ SOLN
INTRAOCULAR | Status: DC | PRN
  Administered 2023-05-03: 4 mL via INTRAOCULAR

## 2023-05-03 MED ORDER — FENTANYL CITRATE (PF) 100 MCG/2ML IJ SOLN
INTRAMUSCULAR | Status: AC
  Filled 2023-05-03: qty 2

## 2023-05-03 MED ORDER — ARMC OPHTHALMIC DILATING DROPS
OPHTHALMIC | Status: AC
  Filled 2023-05-03: qty 0.5

## 2023-05-03 MED ORDER — TETRACAINE HCL 0.5 % OP SOLN
1.0000 [drp] | OPHTHALMIC | Status: DC | PRN
Start: 2023-05-03 — End: 2023-05-03
  Administered 2023-05-03 (×3): 1 [drp] via OPHTHALMIC

## 2023-05-03 MED ORDER — SIGHTPATH DOSE#1 BSS IO SOLN
INTRAOCULAR | Status: DC | PRN
  Administered 2023-05-03: 81 mL via OPHTHALMIC

## 2023-05-03 MED ORDER — SIGHTPATH DOSE#1 NA HYALUR & NA CHOND-NA HYALUR IO KIT
PACK | INTRAOCULAR | Status: DC | PRN
  Administered 2023-05-03: 1 via OPHTHALMIC

## 2023-05-03 MED ORDER — FENTANYL CITRATE (PF) 100 MCG/2ML IJ SOLN
INTRAMUSCULAR | Status: DC | PRN
  Administered 2023-05-03 (×3): 25 ug via INTRAVENOUS

## 2023-05-03 MED ORDER — MOXIFLOXACIN HCL 0.5 % OP SOLN
OPHTHALMIC | Status: DC | PRN
  Administered 2023-05-03: .2 mL via OPHTHALMIC

## 2023-05-03 MED ORDER — TETRACAINE HCL 0.5 % OP SOLN
OPHTHALMIC | Status: AC
  Filled 2023-05-03: qty 4

## 2023-05-03 SURGICAL SUPPLY — 12 items
CATARACT SUITE SIGHTPATH (MISCELLANEOUS) ×1 IMPLANT
DISSECTOR HYDRO NUCLEUS 50X22 (MISCELLANEOUS) ×1 IMPLANT
FEE CATARACT SUITE SIGHTPATH (MISCELLANEOUS) ×1 IMPLANT
GLOVE PI ULTRA LF STRL 7.5 (GLOVE) ×1 IMPLANT
GLOVE SURG POLYISOPRENE 8.5 (GLOVE) ×1 IMPLANT
GLOVE SURG PROTEXIS BL SZ6.5 (GLOVE) ×1 IMPLANT
GLOVE SURG SYN 6.5 PF PI BL (GLOVE) ×1 IMPLANT
GLOVE SURG SYN 8.5 PF PI BL (GLOVE) ×1 IMPLANT
LENS IOL TECNIS EYHANCE 25.0 (Intraocular Lens) IMPLANT
NDL FILTER BLUNT 18X1 1/2 (NEEDLE) ×1 IMPLANT
NEEDLE FILTER BLUNT 18X1 1/2 (NEEDLE) ×1 IMPLANT
SYR 3ML LL SCALE MARK (SYRINGE) ×1 IMPLANT

## 2023-05-03 NOTE — Discharge Instructions (Signed)

## 2023-05-03 NOTE — Transfer of Care (Signed)
 Immediate Anesthesia Transfer of Care Note  Patient: Amber Galloway  Procedure(s) Performed: PHACOEMULSIFICATION, CATARACT, WITH IOL INSERTION 9.68 00:55.5 (Left: Eye)  Patient Location: PACU  Anesthesia Type: MAC  Level of Consciousness: awake, alert  and patient cooperative  Airway and Oxygen Therapy: Patient Spontanous Breathing and Patient connected to supplemental oxygen  Post-op Assessment: Post-op Vital signs reviewed, Patient's Cardiovascular Status Stable, Respiratory Function Stable, Patent Airway and No signs of Nausea or vomiting  Post-op Vital Signs: Reviewed and stable  Complications: No notable events documented.

## 2023-05-03 NOTE — H&P (Signed)
 [facility]   Primary Care Physician:  Clarise Crooks, MD Ophthalmologist: Dr. Dusty Gin  Pre-Procedure History & Physical: HPI:  Amber Galloway is a 83 y.o. female here for cataract surgery.   Past Medical History:  Diagnosis Date   Anemia    Anxiety    Arthritis    Bladder tumor    Breast cancer (HCC) 08/2012   rt mastectomy   GERD (gastroesophageal reflux disease)    Headache    h/o migraines   Hyperlipidemia    Hypertension    Melanoma (HCC)    Myocardial infarction (HCC) 1997   no stents   PONV (postoperative nausea and vomiting)    Pre-diabetes     Past Surgical History:  Procedure Laterality Date   APPENDECTOMY     CHOLECYSTECTOMY     COLONOSCOPY  2014   cleared   COLONOSCOPY WITH PROPOFOL  N/A 07/03/2019   Procedure: COLONOSCOPY WITH PROPOFOL ;  Surgeon: Toledo, Alphonsus Jeans, MD;  Location: [facility];  Service: Gastroenterology;  Laterality: N/A;   CYSTOSCOPY WITH BIOPSY N/A 11/29/2020   Procedure: CYSTOSCOPY WITH BLADDER BIOPSY;  Surgeon: Lawerence Pressman, MD;  Location: [facility];  Service: Urology;  Laterality: N/A;   MASTECTOMY Right 08/2012   TRANSURETHRAL RESECTION OF BLADDER TUMOR WITH MITOMYCIN -C N/A 08/02/2020   Procedure: TRANSURETHRAL RESECTION OF BLADDER TUMOR WITH GEMCITABINE ;  Surgeon: Lawerence Pressman, MD;  Location: [facility];  Service: Urology;  Laterality: N/A;   VAGINAL HYSTERECTOMY      Prior to Admission medications   Medication Sig Start Date End Date Taking? Authorizing Provider  acetaminophen  (TYLENOL ) 325 MG tablet Take 325-650 mg by mouth every 6 (six) hours as needed (pain.).   Yes [provider]  aspirin EC 81 MG tablet Take 81 mg by mouth in the morning.   Yes [provider]  hydrochlorothiazide  (MICROZIDE ) 12.5 MG capsule TAKE 1 CAPSULE BY MOUTH EVERY DAY 04/27/23  Yes Jones, Deanna C, MD  latanoprost (XALATAN) 0.005 % ophthalmic solution Place 1 drop into both eyes daily.   Yes [provider]   metoprolol  tartrate (LOPRESSOR ) 50 MG tablet Take 1 tablet (50 mg total) by mouth 2 (two) times daily. 04/27/23  Yes Clarise Crooks, MD  omeprazole  (PRILOSEC) 20 MG capsule Take 1 capsule (20 mg total) by mouth daily. 04/27/23  Yes Clarise Crooks, MD    Allergies as of 04/08/2023   (No Known Allergies)    Family History  Problem Relation Age of Onset   Breast cancer Sister 76   Brain cancer Daughter    Breast cancer Maternal Aunt 27    Social History   Socioeconomic History   Marital status: Widowed    Spouse name: Not on file   Number of children: 1   Years of education: 12   Highest education level: High school graduate  Occupational History   Occupation: Retired  Tobacco Use   Smoking status: Never    Passive exposure: Never   Smokeless tobacco: Never  Vaping Use   Vaping status: Never Used  Substance and Sexual Activity   Alcohol use: Not Currently   Drug use: Never   Sexual activity: Not Currently    Partners: Male  Other Topics Concern   Not on file  Social History Narrative   Pt lives alone.   Feels safe in her home. Neighbors nearby.   Granddaughter will keep patient after surgery.   Social Drivers of Corporate investment banker Strain: Low  Risk  (11/10/2022)   Overall Financial Resource Strain (CARDIA)    Difficulty of Paying Living Expenses: Not hard at all  Food Insecurity: No Food Insecurity (11/10/2022)   Hunger Vital Sign    Worried About Running Out of Food in the Last Year: Never true    Ran Out of Food in the Last Year: Never true  Transportation Needs: No Transportation Needs (11/10/2022)   PRAPARE - Administrator, Civil Service (Medical): No    Lack of Transportation (Non-Medical): No  Physical Activity: Inactive (11/10/2022)   Exercise Vital Sign    Days of Exercise per Week: 0 days    Minutes of Exercise per Session: 0 min  Stress: No Stress Concern Present (11/10/2022)   [employer] of Occupational Health -  Occupational Stress Questionnaire    Feeling of Stress : Not at all  Social Connections: Moderately Integrated (11/10/2022)   Social Connection and Isolation Panel [NHANES]    Frequency of Communication with Friends and Family: More than three times a week    Frequency of Social Gatherings with Friends and Family: More than three times a week    Attends Religious Services: More than 4 times per year    Active Member of [employer] or Organizations: Yes    Attends Banker Meetings: More than 4 times per year    Marital Status: Widowed  Intimate Partner Violence: Not At Risk (11/10/2022)   Humiliation, Afraid, Rape, and Kick questionnaire    Fear of Current or Ex-Partner: No    Emotionally Abused: No    Physically Abused: No    Sexually Abused: No    Review of Systems: See HPI, otherwise negative ROS  Physical Exam: BP (!) 163/58   Pulse 72   Temp (!) 97.1 F (36.2 C) (Temporal)   Wt 56.6 kg   SpO2 100%   BMI 21.42 kg/m  General:   Alert, cooperative. Head:  Normocephalic and atraumatic. Respiratory:  Normal work of breathing. Cardiovascular:  NAD  Impression/Plan: Amber Galloway is here for cataract surgery.  Risks, benefits, limitations, and alternatives regarding cataract surgery have been reviewed with the patient.  Questions have been answered.  All parties agreeable.   Dusty Gin, MD  05/03/2023, 9:46 AM

## 2023-05-03 NOTE — Op Note (Signed)
 OPERATIVE NOTE  Amber Galloway [id number] 05/03/2023   PREOPERATIVE DIAGNOSIS:  Nuclear sclerotic cataract left eye.  H25.12   POSTOPERATIVE DIAGNOSIS:    Nuclear sclerotic cataract left eye.     PROCEDURE:  Phacoemusification with posterior chamber intraocular lens placement of the left eye   LENS:   Implant Name Type Inv. Item Serial No. Manufacturer Lot No. LRB No. Used Action  LENS IOL TECNIS EYHANCE 25.0 - [id number] Intraocular Lens LENS IOL TECNIS EYHANCE 25.0 [id number] SIGHTPATH  Left 1 Implanted      Procedure(s): PHACOEMULSIFICATION, CATARACT, WITH IOL INSERTION 9.68 00:55.5 (Left)  SURGEON:  Dusty Gin, MD, MPH   ANESTHESIA:  Topical with tetracaine  drops augmented with 1% preservative-free intracameral lidocaine .  ESTIMATED BLOOD LOSS: <1 mL   COMPLICATIONS:  None.   DESCRIPTION OF PROCEDURE:  The patient was identified in the holding room and transported to the operating room and placed in the supine position under the operating microscope.  The left eye was identified as the operative eye and it was prepped and draped in the usual sterile ophthalmic fashion.   A 1.0 millimeter clear-corneal paracentesis was made at the 5:00 position. 0.5 ml of preservative-free 1% lidocaine  with epinephrine  was injected into the anterior chamber.  The anterior chamber was filled with viscoelastic.  A 2.4 millimeter keratome was used to make a near-clear corneal incision at the 2:00 position.  A curvilinear capsulorrhexis was made with a cystotome and capsulorrhexis forceps.  Balanced salt  solution was used to hydrodissect and hydrodelineate the nucleus.   Phacoemulsification was then used in stop and chop fashion to remove the lens nucleus and epinucleus.  The remaining cortex was then removed using the irrigation and aspiration handpiece. Viscoelastic was then placed into the capsular bag to distend it for lens placement.  A lens was then injected into the capsular bag.  The  remaining viscoelastic was aspirated.   Wounds were hydrated with balanced salt  solution.  The anterior chamber was inflated to a physiologic pressure with balanced salt  solution.  Intracameral vigamox  0.1 mL undiltued was injected into the eye and a drop placed onto the ocular surface. No wound leaks were noted.  The patient was taken to the recovery room in stable condition without complications of anesthesia or surgery  Dusty Gin 05/03/2023, 10:14 AM

## 2023-05-03 NOTE — Anesthesia Postprocedure Evaluation (Signed)
 Anesthesia Post Note  Patient: SHAYLI ALTEMOSE  Procedure(s) Performed: PHACOEMULSIFICATION, CATARACT, WITH IOL INSERTION 9.68 00:55.5 (Left: Eye)  Patient location during evaluation: PACU Anesthesia Type: MAC Level of consciousness: awake and alert Pain management: pain level controlled Vital Signs Assessment: post-procedure vital signs reviewed and stable Respiratory status: spontaneous breathing, nonlabored ventilation and respiratory function stable Cardiovascular status: stable and blood pressure returned to baseline Postop Assessment: no apparent nausea or vomiting Anesthetic complications: no   No notable events documented.   Last Vitals:  Vitals:   05/03/23 1015 05/03/23 1022  BP: (!) 154/64 137/70  Pulse: 80 81  Resp: 14 18  Temp:  36.5 C  SpO2: 97% 96%    Last Pain:  Vitals:   05/03/23 1022  TempSrc:   PainSc: 0-No pain                 Baltazar Bonier

## 2023-05-04 DIAGNOSIS — H2511 Age-related nuclear cataract, right eye: Secondary | ICD-10-CM | POA: Diagnosis not present

## 2023-05-11 NOTE — Anesthesia Preprocedure Evaluation (Addendum)
 Anesthesia Evaluation  Patient identified by MRN, date of birth, ID band Patient awake    Reviewed: Allergy & Precautions, H&P , NPO status , Patient's Chart, lab work & pertinent test results  Airway Mallampati: II  TM Distance: <3 FB Neck ROM: full    Dental  (+) Edentulous Upper, Poor Dentition   Pulmonary neg pulmonary ROS   Pulmonary exam normal        Cardiovascular hypertension, + Past MI  Normal cardiovascular exam     Neuro/Psych   Anxiety     negative neurological ROS  negative psych ROS   GI/Hepatic Neg liver ROS,GERD  Controlled,,  Endo/Other  negative endocrine ROS    Renal/GU      Musculoskeletal   Abdominal Normal abdominal exam  (+)   Peds  Hematology negative hematology ROS (+)   Anesthesia Other Findings Previous cataract surgery 05-03-23 Dr. Lincoln Renshaw was anesthesiologist  Hypertension  Hyperlipidemia GERD (gastroesophageal reflux disease) Melanoma (HCC) Breast cancer (HCC)  Myocardial infarction (HCC) PONV (postoperative nausea and vomiting) Anxiety Pre-diabetes  Headache Bladder tumor  Anemia Arthritis     Reproductive/Obstetrics negative OB ROS                              Anesthesia Physical Anesthesia Plan  ASA: 3  Anesthesia Plan: MAC   Post-op Pain Management:    Induction: Intravenous  PONV Risk Score and Plan:   Airway Management Planned: Natural Airway and Nasal Cannula  Additional Equipment:   Intra-op Plan:   Post-operative Plan:   Informed Consent: I have reviewed the patients History and Physical, chart, labs and discussed the procedure including the risks, benefits and alternatives for the proposed anesthesia with the patient or authorized representative who has indicated his/her understanding and acceptance.     Dental Advisory Given  Plan Discussed with: Anesthesiologist, CRNA and Surgeon  Anesthesia Plan Comments:           Anesthesia Quick Evaluation

## 2023-05-13 NOTE — Discharge Instructions (Signed)

## 2023-05-17 ENCOUNTER — Encounter
Admission: RE | Disposition: A | Discharge status: Home / Self Care | Payer: Self-pay | Source: Home / Self Care | Attending: Ophthalmology

## 2023-05-17 ENCOUNTER — Ambulatory Visit: Payer: Self-pay | Admitting: Anesthesiology

## 2023-05-17 ENCOUNTER — Other Ambulatory Visit: Payer: Self-pay

## 2023-05-17 ENCOUNTER — Ambulatory Visit
Admission: RE | Admit: 2023-05-17 | Discharge: 2023-05-17 | Disposition: A | Discharge status: Home / Self Care | Attending: Ophthalmology | Admitting: Ophthalmology

## 2023-05-17 ENCOUNTER — Encounter: Payer: Self-pay | Admitting: Ophthalmology

## 2023-05-17 DIAGNOSIS — H25011 Cortical age-related cataract, right eye: Secondary | ICD-10-CM | POA: Diagnosis not present

## 2023-05-17 DIAGNOSIS — H2511 Age-related nuclear cataract, right eye: Secondary | ICD-10-CM | POA: Diagnosis not present

## 2023-05-17 DIAGNOSIS — I252 Old myocardial infarction: Secondary | ICD-10-CM | POA: Insufficient documentation

## 2023-05-17 DIAGNOSIS — Z9842 Cataract extraction status, left eye: Secondary | ICD-10-CM | POA: Diagnosis not present

## 2023-05-17 DIAGNOSIS — Z961 Presence of intraocular lens: Secondary | ICD-10-CM | POA: Insufficient documentation

## 2023-05-17 DIAGNOSIS — K219 Gastro-esophageal reflux disease without esophagitis: Secondary | ICD-10-CM | POA: Diagnosis not present

## 2023-05-17 DIAGNOSIS — I10 Essential (primary) hypertension: Secondary | ICD-10-CM | POA: Insufficient documentation

## 2023-05-17 HISTORY — PX: CATARACT EXTRACTION W/PHACO: SHX586

## 2023-05-17 SURGERY — PHACOEMULSIFICATION, CATARACT, WITH IOL INSERTION
Anesthesia: Monitor Anesthesia Care | Laterality: Right

## 2023-05-17 MED ORDER — LIDOCAINE HCL (PF) 2 % IJ SOLN
INTRAOCULAR | Status: DC | PRN
  Administered 2023-05-17: 4 mL via INTRAOCULAR

## 2023-05-17 MED ORDER — SIGHTPATH DOSE#1 NA HYALUR & NA CHOND-NA HYALUR IO KIT
PACK | INTRAOCULAR | Status: DC | PRN
  Administered 2023-05-17: 1 via OPHTHALMIC

## 2023-05-17 MED ORDER — ARMC OPHTHALMIC DILATING DROPS
1.0000 | OPHTHALMIC | Status: DC | PRN
  Administered 2023-05-17 (×3): 1 via OPHTHALMIC

## 2023-05-17 MED ORDER — SIGHTPATH DOSE#1 BSS IO SOLN
INTRAOCULAR | Status: DC | PRN
  Administered 2023-05-17: 15 mL via INTRAOCULAR

## 2023-05-17 MED ORDER — SIGHTPATH DOSE#1 BSS IO SOLN
INTRAOCULAR | Status: DC | PRN
  Administered 2023-05-17: 104 mL via OPHTHALMIC

## 2023-05-17 MED ORDER — MOXIFLOXACIN HCL 0.5 % OP SOLN
OPHTHALMIC | Status: DC | PRN
  Administered 2023-05-17: .2 mL via OPHTHALMIC

## 2023-05-17 MED ORDER — FENTANYL CITRATE (PF) 100 MCG/2ML IJ SOLN
INTRAMUSCULAR | Status: DC | PRN
  Administered 2023-05-17 (×2): 50 ug via INTRAVENOUS

## 2023-05-17 MED ORDER — ARMC OPHTHALMIC DILATING DROPS
OPHTHALMIC | Status: AC
  Filled 2023-05-17: qty 0.5

## 2023-05-17 MED ORDER — TETRACAINE HCL 0.5 % OP SOLN
OPHTHALMIC | Status: AC
  Filled 2023-05-17: qty 4

## 2023-05-17 MED ORDER — ONDANSETRON HCL 4 MG/2ML IJ SOLN
4.0000 mg | Freq: Once | INTRAMUSCULAR | Status: AC
  Administered 2023-05-17: 4 mg via INTRAVENOUS

## 2023-05-17 MED ORDER — SIGHTPATH DOSE#1 NA CHONDROIT SULF-NA HYALURON 20-15 MG/0.5ML IO SOSY
INTRAOCULAR | Status: DC | PRN
  Administered 2023-05-17: .5 mL via INTRAOCULAR

## 2023-05-17 MED ORDER — ONDANSETRON HCL 4 MG/2ML IJ SOLN
INTRAMUSCULAR | Status: AC
  Filled 2023-05-17: qty 2

## 2023-05-17 MED ORDER — FENTANYL CITRATE (PF) 100 MCG/2ML IJ SOLN
INTRAMUSCULAR | Status: AC
  Filled 2023-05-17: qty 2

## 2023-05-17 MED ORDER — TETRACAINE HCL 0.5 % OP SOLN
1.0000 [drp] | OPHTHALMIC | Status: DC | PRN
  Administered 2023-05-17 (×3): 1 [drp] via OPHTHALMIC

## 2023-05-17 SURGICAL SUPPLY — 14 items
CATARACT SUITE SIGHTPATH (MISCELLANEOUS) ×1 IMPLANT
CUTTER IOL 19 GA PARKER CHANG (INSTRUMENTS) IMPLANT
DISSECTOR HYDRO NUCLEUS 50X22 (MISCELLANEOUS) ×1 IMPLANT
FEE CATARACT SUITE SIGHTPATH (MISCELLANEOUS) ×1 IMPLANT
GLOVE PI ULTRA LF STRL 7.5 (GLOVE) ×1 IMPLANT
GLOVE SURG POLYISOPRENE 8.5 (GLOVE) ×1 IMPLANT
GLOVE SURG PROTEXIS BL SZ6.5 (GLOVE) ×1 IMPLANT
GLOVE SURG SYN 6.5 PF PI BL (GLOVE) ×1 IMPLANT
GLOVE SURG SYN 8.5 PF PI BL (GLOVE) ×1 IMPLANT
LENS IOL TECNIS EYHANCE 25.0 (Intraocular Lens) IMPLANT
NDL FILTER BLUNT 18X1 1/2 (NEEDLE) ×1 IMPLANT
NEEDLE FILTER BLUNT 18X1 1/2 (NEEDLE) ×1 IMPLANT
SUTURE EHLN 10-0 CS-B-6CS-B-6 (SUTURE) IMPLANT
SYR 3ML LL SCALE MARK (SYRINGE) ×1 IMPLANT

## 2023-05-17 NOTE — Op Note (Signed)
 OPERATIVE NOTE  Amber Galloway [id number] 05/17/2023   PREOPERATIVE DIAGNOSIS:  Nuclear sclerotic cataract right eye.  H25.11   POSTOPERATIVE DIAGNOSIS:    Nuclear sclerotic cataract right eye.     PROCEDURE:  Phacoemusification with posterior chamber intraocular lens placement of the right eye   LENS:   Implant Name Type Inv. Item Serial No. Manufacturer Lot No. LRB No. Used Action  LENS IOL TECNIS EYHANCE 25.0 - [id number] Intraocular Lens LENS IOL TECNIS EYHANCE 25.0 [id number] [employer]  Right 1 Implanted       Procedure(s): PHACOEMULSIFICATION, CATARACT, WITH IOL INSERTION 14.37, 01:12.8 (Right)  SURGEON:  Dusty Gin, MD, MPH  ANESTHESIOLOGIST: Anesthesiologist: Baltazar Bonier, MD CRNA: Adrien Horner, CRNA   ANESTHESIA:  Topical with tetracaine  drops augmented with 1% preservative-free intracameral lidocaine .  ESTIMATED BLOOD LOSS: less than 1 mL.   COMPLICATIONS:  None.   DESCRIPTION OF PROCEDURE:  The patient was identified in the holding room and transported to the operating room and placed in the supine position under the operating microscope.  The right eye was identified as the operative eye and it was prepped and draped in the usual sterile ophthalmic fashion.   A 1.0 millimeter clear-corneal paracentesis was made at the 10:30 position. 0.5 ml of preservative-free 1% lidocaine  with epinephrine  was injected into the anterior chamber.  The anterior chamber was filled with viscoelastic.  A 2.4 millimeter keratome was used to make a near-clear corneal incision at the 8:00 position.  A curvilinear capsulorrhexis was made with a cystotome and capsulorrhexis forceps.  There was some patient movement at the initiation of the rhexis.  Microscissors were used to create a new flap and the rhexis was completed without complication, but it was irregular and decentered.   Phacoemulsification was then used in stop and chop fashion to remove the lens nucleus and epinucleus.   The remaining cortex was then removed using the irrigation and aspiration handpiece. Viscoelastic was then placed into the capsular bag to distend it for lens placement.  A lens was then injected into the capsular bag.  The remaining viscoelastic was aspirated.   Wounds were hydrated with balanced salt  solution.  The anterior chamber was inflated to a physiologic pressure with balanced salt  solution.   Intracameral vigamox  0.1 mL undiluted was injected into the eye and a drop placed onto the ocular surface.  The lens was well centered and edges covered by the anterior capsule.    No wound leaks were noted.  The patient was taken to the recovery room in stable condition without complications of anesthesia or surgery  Dusty Gin 05/17/2023, 9:12 AM

## 2023-05-17 NOTE — Anesthesia Postprocedure Evaluation (Signed)
 Anesthesia Post Note  Patient: Amber Galloway  Procedure(s) Performed: PHACOEMULSIFICATION, CATARACT, WITH IOL INSERTION 14.37, 01:12.8 (Right)  Patient location during evaluation: PACU Anesthesia Type: MAC Level of consciousness: awake and alert Pain management: pain level controlled Vital Signs Assessment: post-procedure vital signs reviewed and stable Respiratory status: spontaneous breathing, nonlabored ventilation and respiratory function stable Cardiovascular status: blood pressure returned to baseline and stable Postop Assessment: no apparent nausea or vomiting Anesthetic complications: no   No notable events documented.   Last Vitals:  Vitals:   05/17/23 0915 05/17/23 0920  BP: (!) 151/93 (!) 143/72  Pulse: 91 80  Resp: 14 15  Temp: 36.5 C 36.5 C  SpO2: 98% 97%    Last Pain:  Vitals:   05/17/23 0920  TempSrc:   PainSc: 0-No pain                 Baltazar Bonier

## 2023-05-17 NOTE — Transfer of Care (Signed)
 Immediate Anesthesia Transfer of Care Note  Patient: Amber Galloway  Procedure(s) Performed: PHACOEMULSIFICATION, CATARACT, WITH IOL INSERTION 14.37, 01:12.8 (Right)  Patient Location: PACU  Anesthesia Type: MAC  Level of Consciousness: awake, alert  and patient cooperative  Airway and Oxygen Therapy: Patient Spontanous Breathing and Patient connected to supplemental oxygen  Post-op Assessment: Post-op Vital signs reviewed, Patient's Cardiovascular Status Stable, Respiratory Function Stable, Patent Airway and No signs of Nausea or vomiting  Post-op Vital Signs: Reviewed and stable  Complications: No notable events documented.

## 2023-05-17 NOTE — Addendum Note (Signed)
 Addendum  created 05/17/23 1046 by Baltazar Bonier, MD   Attestation recorded in [place], Intraprocedure Attestations filed

## 2023-05-17 NOTE — H&P (Signed)
 [facility]   Primary Care Physician:  Clarise Crooks, MD Ophthalmologist: Dr. Dusty Gin  Pre-Procedure History & Physical: HPI:  Amber Galloway is a 83 y.o. female here for cataract surgery.   Past Medical History:  Diagnosis Date   Anemia    Anxiety    Arthritis    Bladder tumor    Breast cancer (HCC) 08/2012   rt mastectomy   GERD (gastroesophageal reflux disease)    Headache    h/o migraines   Hyperlipidemia    Hypertension    Melanoma (HCC)    Myocardial infarction (HCC) 1997   no stents   PONV (postoperative nausea and vomiting)    Pre-diabetes     Past Surgical History:  Procedure Laterality Date   APPENDECTOMY     CATARACT EXTRACTION W/PHACO Left 05/03/2023   Procedure: PHACOEMULSIFICATION, CATARACT, WITH IOL INSERTION 9.68 00:55.5;  Surgeon: Rosa College, MD;  Location: [facility];  Service: Ophthalmology;  Laterality: Left;   CHOLECYSTECTOMY     COLONOSCOPY  2014   cleared   COLONOSCOPY WITH PROPOFOL  N/A 07/03/2019   Procedure: COLONOSCOPY WITH PROPOFOL ;  Surgeon: Toledo, Alphonsus Jeans, MD;  Location: [facility];  Service: Gastroenterology;  Laterality: N/A;   CYSTOSCOPY WITH BIOPSY N/A 11/29/2020   Procedure: CYSTOSCOPY WITH BLADDER BIOPSY;  Surgeon: Lawerence Pressman, MD;  Location: [facility];  Service: Urology;  Laterality: N/A;   MASTECTOMY Right 08/2012   TRANSURETHRAL RESECTION OF BLADDER TUMOR WITH MITOMYCIN -C N/A 08/02/2020   Procedure: TRANSURETHRAL RESECTION OF BLADDER TUMOR WITH GEMCITABINE ;  Surgeon: Lawerence Pressman, MD;  Location: [facility];  Service: Urology;  Laterality: N/A;   VAGINAL HYSTERECTOMY      Prior to Admission medications   Medication Sig Start Date End Date Taking? Authorizing Provider  acetaminophen  (TYLENOL ) 325 MG tablet Take 325-650 mg by mouth every 6 (six) hours as needed (pain.).   Yes [provider]  aspirin EC 81 MG tablet Take 81 mg by mouth in the morning.   Yes [provider]   hydrochlorothiazide  (MICROZIDE ) 12.5 MG capsule TAKE 1 CAPSULE BY MOUTH EVERY DAY 04/27/23  Yes Jones, Deanna C, MD  latanoprost (XALATAN) 0.005 % ophthalmic solution Place 1 drop into both eyes daily.   Yes [provider]  metoprolol  tartrate (LOPRESSOR ) 50 MG tablet Take 1 tablet (50 mg total) by mouth 2 (two) times daily. 04/27/23  Yes Clarise Crooks, MD  omeprazole  (PRILOSEC) 20 MG capsule Take 1 capsule (20 mg total) by mouth daily. 04/27/23  Yes Clarise Crooks, MD    Allergies as of 04/08/2023   (No Known Allergies)    Family History  Problem Relation Age of Onset   Breast cancer Sister 26   Brain cancer Daughter    Breast cancer Maternal Aunt 63    Social History   Socioeconomic History   Marital status: Widowed    Spouse name: Not on file   Number of children: 1   Years of education: 12   Highest education level: High school graduate  Occupational History   Occupation: Retired  Tobacco Use   Smoking status: Never    Passive exposure: Never   Smokeless tobacco: Never  Vaping Use   Vaping status: Never Used  Substance and Sexual Activity   Alcohol use: Not Currently   Drug use: Never   Sexual activity: Not Currently    Partners: Male  Other Topics Concern   Not on file  Social History Narrative  Pt lives alone.   Feels safe in her home. Neighbors nearby.   Granddaughter will keep patient after surgery.   Social Drivers of Corporate investment banker Strain: Low Risk  (11/10/2022)   Overall Financial Resource Strain (CARDIA)    Difficulty of Paying Living Expenses: Not hard at all  Food Insecurity: No Food Insecurity (11/10/2022)   Hunger Vital Sign    Worried About Running Out of Food in the Last Year: Never true    Ran Out of Food in the Last Year: Never true  Transportation Needs: No Transportation Needs (11/10/2022)   PRAPARE - Administrator, Civil Service (Medical): No    Lack of Transportation (Non-Medical): No  Physical  Activity: Inactive (11/10/2022)   Exercise Vital Sign    Days of Exercise per Week: 0 days    Minutes of Exercise per Session: 0 min  Stress: No Stress Concern Present (11/10/2022)   [employer] of Occupational Health - Occupational Stress Questionnaire    Feeling of Stress : Not at all  Social Connections: Moderately Integrated (11/10/2022)   Social Connection and Isolation Panel [NHANES]    Frequency of Communication with Friends and Family: More than three times a week    Frequency of Social Gatherings with Friends and Family: More than three times a week    Attends Religious Services: More than 4 times per year    Active Member of [employer] or Organizations: Yes    Attends Banker Meetings: More than 4 times per year    Marital Status: Widowed  Intimate Partner Violence: Not At Risk (11/10/2022)   Humiliation, Afraid, Rape, and Kick questionnaire    Fear of Current or Ex-Partner: No    Emotionally Abused: No    Physically Abused: No    Sexually Abused: No    Review of Systems: See HPI, otherwise negative ROS  Physical Exam: BP (!) 144/63   Pulse 68   Temp (!) 97.2 F (36.2 C) (Temporal)   Resp 10   Ht 5\' 4"  (1.626 m)   Wt 55.8 kg   SpO2 100%   BMI 21.11 kg/m  General:   Alert, cooperative. Head:  Normocephalic and atraumatic. Respiratory:  Normal work of breathing. Cardiovascular:  NAD  Impression/Plan: Amber Galloway is here for cataract surgery.  Risks, benefits, limitations, and alternatives regarding cataract surgery have been reviewed with the patient.  Questions have been answered.  All parties agreeable.   Dusty Gin, MD  05/17/2023, 8:38 AM

## 2023-05-27 ENCOUNTER — Ambulatory Visit: Payer: Self-pay | Admitting: Urology

## 2023-05-27 VITALS — BP 153/74 | HR 83 | Ht 64.0 in | Wt 123.0 lb

## 2023-05-27 DIAGNOSIS — Z8551 Personal history of malignant neoplasm of bladder: Secondary | ICD-10-CM | POA: Diagnosis not present

## 2023-05-27 DIAGNOSIS — Z792 Long term (current) use of antibiotics: Secondary | ICD-10-CM

## 2023-05-27 MED ORDER — CEPHALEXIN 250 MG PO CAPS
500.0000 mg | ORAL_CAPSULE | Freq: Once | ORAL | Status: AC
  Administered 2023-05-27: 500 mg via ORAL

## 2023-05-27 NOTE — Patient Instructions (Signed)
 Kegel Exercises  Kegel exercises can help strengthen your pelvic floor muscles. The pelvic floor is a group of muscles that support your rectum, small intestine, and bladder. In females, pelvic floor muscles also help support the uterus. These muscles help you control the flow of urine and stool (feces). Kegel exercises are painless and simple. They do not require any equipment. Your provider may suggest Kegel exercises to: Improve bladder and bowel control. Improve sexual response. Improve weak pelvic floor muscles after surgery to remove the uterus (hysterectomy) or after pregnancy, in females. Improve weak pelvic floor muscles after prostate gland removal or surgery, in males. Kegel exercises involve squeezing your pelvic floor muscles. These are the same muscles you squeeze when you try to stop the flow of urine or keep from passing gas. The exercises can be done while sitting, standing, or lying down, but it is best to vary your position. Ask your health care provider which exercises are safe for you. Do exercises exactly as told by your health care provider and adjust them as directed. Do not begin these exercises until told by your health care provider. Exercises How to do Kegel exercises: Squeeze your pelvic floor muscles tight. You should feel a tight lift in your rectal area. If you are a female, you should also feel a tightness in your vaginal area. Keep your stomach, buttocks, and legs relaxed. Hold the muscles tight for up to 10 seconds. Breathe normally. Relax your muscles for up to 10 seconds. Repeat as told by your health care provider. Repeat this exercise daily as told by your health care provider. Continue to do this exercise for at least 4-6 weeks, or for as long as told by your health care provider. You may be referred to a physical therapist who can help you learn more about how to do Kegel exercises. Depending on your condition, your health care provider may  recommend: Varying how long you squeeze your muscles. Doing several sets of exercises every day. Doing exercises for several weeks. Making Kegel exercises a part of your regular exercise routine. This information is not intended to replace advice given to you by your health care provider. Make sure you discuss any questions you have with your health care provider. Kegel Exercises There are many reasons your provider may suggest Kegel exercises. This video will teach you how to do Kegel exercises. To view the content, go to this web address: [email or web address]  This video will expire on: 12/23/2024. If you need access to this video following this date, please reach out to the healthcare provider who assigned it to you. This information is not intended to replace advice given to you by your health care provider. Make sure you discuss any questions you have with your health care provider. Elsevier Patient Education  2024 [employer].

## 2023-05-27 NOTE — Progress Notes (Signed)
 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  Path HG Ta  -Recurrence 11/2020: Multiple small papillary tumors biopsied and fulgurated, HG Ta  -Induction BCG completed 02/2021 -She deferred mBCG   Keflex  given for prophylaxis  After informed consent and discussion of the procedure and its risks, RILDA NORWAY was positioned and prepped in the standard fashion. Cystoscopy was performed with a flexible cystoscope. The urethra, bladder neck and entire bladder was visualized in a standard fashion.  Bladder mucosa grossly normal throughout, no abnormalities on retroflexion.   Findings: Normal cystoscopy   Assessment and Plan: No evidence of recurrence  She reports new urinary incontinence over the last 3 weeks, this is not stress or urge incontinence, but leakage without sensation that requires a pad.  No evidence of overflow, bladder was empty today.  I recommended starting with Kegel exercises and extensive information provided.  RTC 4 weeks PA symptom check regarding leakage RTC 6 month cysto, can space to yearly starting 11/2024  Jay Meth, MD 05/27/2023

## 2023-06-10 ENCOUNTER — Ambulatory Visit
Admission: RE | Admit: 2023-06-10 | Discharge: 2023-06-10 | Disposition: A | Discharge status: Home / Self Care | Source: Ambulatory Visit | Attending: Family Medicine | Admitting: Family Medicine

## 2023-06-10 DIAGNOSIS — Z1231 Encounter for screening mammogram for malignant neoplasm of breast: Secondary | ICD-10-CM | POA: Diagnosis not present

## 2023-06-11 DIAGNOSIS — Z961 Presence of intraocular lens: Secondary | ICD-10-CM | POA: Diagnosis not present

## 2023-06-24 ENCOUNTER — Ambulatory Visit: Admitting: Physician Assistant

## 2023-06-24 VITALS — BP 157/71 | HR 95 | Ht 64.0 in | Wt 126.0 lb

## 2023-06-24 DIAGNOSIS — N3946 Mixed incontinence: Secondary | ICD-10-CM | POA: Diagnosis not present

## 2023-06-24 MED ORDER — TROSPIUM CHLORIDE 20 MG PO TABS: 20.0000 mg | ORAL_TABLET | Freq: Two times a day (BID) | ORAL | 11 refills | Status: DC

## 2023-06-24 NOTE — Progress Notes (Signed)
 06/24/2023 2:27 PM   Amber Galloway [birth year] [id number]  CC: Chief Complaint  Patient presents with   Bladder Cancer   HPI: Amber Galloway is a 83 y.o. female with PMH bladder cancer up-to-date with surveillance cystoscopy who presents today for evaluation of urinary leakage.   Today she reports chronic urinary leakage without awareness and urgency with urge incontinence.  She denies stress incontinence.  She wears 1 pad daily and lines it with paper, which she will exchange as it gets moist.  She has not been doing Kegel exercises, because she is not sure how to do them.  She does have a history of constipation, but no dry mouth or dry eye.  PMH: Past Medical History:  Diagnosis Date   Anemia    Anxiety    Arthritis    Bladder tumor    Breast cancer (HCC) 08/2012   rt mastectomy   GERD (gastroesophageal reflux disease)    Headache    h/o migraines   Hyperlipidemia    Hypertension    Melanoma (HCC)    Myocardial infarction (HCC) 1997   no stents   PONV (postoperative nausea and vomiting)    Pre-diabetes     Surgical History: Past Surgical History:  Procedure Laterality Date   APPENDECTOMY     CATARACT EXTRACTION W/PHACO Left 05/03/2023   Procedure: PHACOEMULSIFICATION, CATARACT, WITH IOL INSERTION 9.68 00:55.5;  Surgeon: Rosa College, MD;  Location: [facility];  Service: Ophthalmology;  Laterality: Left;   CATARACT EXTRACTION W/PHACO Right 05/17/2023   Procedure: PHACOEMULSIFICATION, CATARACT, WITH IOL INSERTION 14.37, 01:12.8;  Surgeon: Rosa College, MD;  Location: [facility];  Service: Ophthalmology;  Laterality: Right;   CHOLECYSTECTOMY     COLONOSCOPY  2014   cleared   COLONOSCOPY WITH PROPOFOL  N/A 07/03/2019   Procedure: COLONOSCOPY WITH PROPOFOL ;  Surgeon: Toledo, Alphonsus Jeans, MD;  Location: [facility];  Service: Gastroenterology;  Laterality: N/A;   CYSTOSCOPY WITH BIOPSY N/A 11/29/2020   Procedure: CYSTOSCOPY WITH BLADDER  BIOPSY;  Surgeon: Lawerence Pressman, MD;  Location: [facility];  Service: Urology;  Laterality: N/A;   MASTECTOMY Right 08/2012   TRANSURETHRAL RESECTION OF BLADDER TUMOR WITH MITOMYCIN -C N/A 08/02/2020   Procedure: TRANSURETHRAL RESECTION OF BLADDER TUMOR WITH GEMCITABINE ;  Surgeon: Lawerence Pressman, MD;  Location: [facility];  Service: Urology;  Laterality: N/A;   VAGINAL HYSTERECTOMY      Home Medications:  Allergies as of 06/24/2023   No Known Allergies      Medication List        Accurate as of June 24, 2023  2:27 PM. If you have any questions, ask your nurse or doctor.          acetaminophen  325 MG tablet Commonly known as: TYLENOL  Take 325-650 mg by mouth every 6 (six) hours as needed (pain.).   aspirin EC 81 MG tablet Take 81 mg by mouth in the morning.   hydrochlorothiazide  12.5 MG capsule Commonly known as: MICROZIDE  TAKE 1 CAPSULE BY MOUTH EVERY DAY   latanoprost 0.005 % ophthalmic solution Commonly known as: XALATAN Place 1 drop into both eyes daily.   metoprolol  tartrate 50 MG tablet Commonly known as: LOPRESSOR  Take 1 tablet (50 mg total) by mouth 2 (two) times daily.   omeprazole  20 MG capsule Commonly known as: PRILOSEC Take 1 capsule (20 mg total) by mouth daily.   trospium 20 MG tablet Commonly known as: SANCTURA Take 1 tablet (20 mg total) by mouth 2 (  two) times daily.        Allergies:  No Known Allergies  Family History: Family History  Problem Relation Age of Onset   Breast cancer Sister 24   Brain cancer Daughter    Breast cancer Maternal Aunt 29    Social History:   reports that she has never smoked. She has never been exposed to tobacco smoke. She has never used smokeless tobacco. She reports that she does not currently use alcohol. She reports that she does not use drugs.  Physical Exam: BP (!) 157/71   Pulse 95   Ht 5' 4 (1.626 m)   Wt 126 lb (57.2 kg)   BMI 21.63 kg/m   Constitutional:  Alert and oriented, no acute  distress, nontoxic appearing HEENT: [place], AT Cardiovascular: No clubbing, cyanosis, or edema Respiratory: Normal respiratory effort, no increased work of breathing Skin: No rashes, bruises or suspicious lesions Neurologic: Grossly intact, no focal deficits, moving all 4 extremities Psychiatric: Normal mood and affect  Assessment & Plan:   1. Mixed incontinence (Primary) Mixed urge and insensate leakage.  Will start trospium, other antimuscarinics are contraindicated due to age.  If this worsens her constipation, will consider trospium ER or beta 3 agonist per insurance coverage. - trospium (SANCTURA) 20 MG tablet; Take 1 tablet (20 mg total) by mouth 2 (two) times daily.  Dispense: 60 tablet; Refill: 11  Return in about 6 weeks (around 08/05/2023) for Symptom recheck with PVR.  Kathreen Pare, PA-C  [facility] [place] [address] [place], [place] [address] [phone]

## 2023-07-05 ENCOUNTER — Ambulatory Visit: Admitting: Physician Assistant

## 2023-07-05 VITALS — BP 129/65 | HR 83 | Ht 64.0 in | Wt 122.0 lb

## 2023-07-05 DIAGNOSIS — N3946 Mixed incontinence: Secondary | ICD-10-CM | POA: Diagnosis not present

## 2023-07-05 DIAGNOSIS — N3001 Acute cystitis with hematuria: Secondary | ICD-10-CM | POA: Diagnosis not present

## 2023-07-05 LAB — MICROSCOPIC EXAMINATION: WBC, UA: >30 /HPF — AB (ref 0–5)

## 2023-07-05 LAB — URINALYSIS, COMPLETE
Bilirubin, UA: NEGATIVE
Glucose, UA: NEGATIVE
Ketones, UA: NEGATIVE
Nitrite, UA: NEGATIVE
Specific Gravity, UA: 1.015 (ref 1.005–1.030)
Urobilinogen, Ur: 1 mg/dL (ref 0.2–1.0)
pH, UA: 7 (ref 5.0–7.5)

## 2023-07-05 LAB — BLADDER SCAN AMB NON-IMAGING: Scan Result: 21 mL

## 2023-07-05 MED ORDER — MIRABEGRON ER 25 MG PO TB24: 25.0000 mg | ORAL_TABLET | Freq: Every day | ORAL | 0 refills | Status: DC

## 2023-07-05 MED ORDER — NITROFURANTOIN MONOHYD MACRO 100 MG PO CAPS: 100.0000 mg | ORAL_CAPSULE | Freq: Two times a day (BID) | ORAL | 0 refills | Status: AC

## 2023-07-05 NOTE — Progress Notes (Signed)
 07/05/2023 11:44 AM   Amber Galloway 1940/07/09 [id number]  CC: Chief Complaint  Patient presents with   Mixed incontinence   HPI: Amber Galloway is a 83 y.o. female with PMH bladder cancer up-to-date with surveillance cystoscopy and mixed urge and insensate incontinence who recently started trospium  who presents today for evaluation of possible UTI.   Today she reports 2 to 3 days of dysuria, urgency, frequency, and bladder pressure.  She stopped trospium  2 days ago due to some bladder discomfort and constipation.  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  In-office UA today positive for 1+ protein, 1+ blood, and 3+ leukocytes; urine microscopy with >30 WBCs/HPF, 11-30 RBCs/HPF, and moderate bacteria. PVR 21mL.  PMH: Past Medical History:  Diagnosis Date   Anemia    Anxiety    Arthritis    Bladder tumor    Breast cancer (HCC) 08/2012   rt mastectomy   GERD (gastroesophageal reflux disease)    Headache    h/o migraines   Hyperlipidemia    Hypertension    Melanoma (HCC)    Myocardial infarction (HCC) 1997   no stents   PONV (postoperative nausea and vomiting)    Pre-diabetes     Surgical History: Past Surgical History:  Procedure Laterality Date   APPENDECTOMY     CATARACT EXTRACTION W/PHACO Left 05/03/2023   Procedure: PHACOEMULSIFICATION, CATARACT, WITH IOL INSERTION 9.68 00:55.5;  Surgeon: Myrna Adine Anes, MD;  Location: [facility];  Service: Ophthalmology;  Laterality: Left;   CATARACT EXTRACTION W/PHACO Right 05/17/2023   Procedure: PHACOEMULSIFICATION, CATARACT, WITH IOL INSERTION 14.37, 01:12.8;  Surgeon: Myrna Adine Anes, MD;  Location: [facility];  Service: Ophthalmology;  Laterality: Right;   CHOLECYSTECTOMY     COLONOSCOPY  2014   cleared   COLONOSCOPY WITH PROPOFOL  N/A 07/03/2019   Procedure: COLONOSCOPY WITH PROPOFOL ;  Surgeon: Toledo, Ladell POUR, MD;  Location: [facility];  Service:  Gastroenterology;  Laterality: N/A;   CYSTOSCOPY WITH BIOPSY N/A 11/29/2020   Procedure: CYSTOSCOPY WITH BLADDER BIOPSY;  Surgeon: Francisca Redell BROCKS, MD;  Location: [facility];  Service: Urology;  Laterality: N/A;   MASTECTOMY Right 08/2012   TRANSURETHRAL RESECTION OF BLADDER TUMOR WITH MITOMYCIN -C N/A 08/02/2020   Procedure: TRANSURETHRAL RESECTION OF BLADDER TUMOR WITH GEMCITABINE ;  Surgeon: Francisca Redell BROCKS, MD;  Location: [facility];  Service: Urology;  Laterality: N/A;   VAGINAL HYSTERECTOMY      Home Medications:  Allergies as of 07/05/2023   No Known Allergies      Medication List        Accurate as of July 05, 2023 11:44 AM. If you have any questions, ask your nurse or doctor.          acetaminophen  325 MG tablet Commonly known as: TYLENOL  Take 325-650 mg by mouth every 6 (six) hours as needed (pain.).   aspirin EC 81 MG tablet Take 81 mg by mouth in the morning.   hydrochlorothiazide  12.5 MG capsule Commonly known as: MICROZIDE  TAKE 1 CAPSULE BY MOUTH EVERY DAY   latanoprost 0.005 % ophthalmic solution Commonly known as: XALATAN Place 1 drop into both eyes daily.   metoprolol  tartrate 50 MG tablet Commonly known as: LOPRESSOR  Take 1 tablet (50 mg total) by mouth 2 (two) times daily.   omeprazole  20 MG capsule Commonly known as: PRILOSEC Take 1 capsule (20 mg total) by mouth daily.   trospium  20 MG tablet Commonly known as: SANCTURA  Take 1  tablet (20 mg total) by mouth 2 (two) times daily.        Allergies:  No Known Allergies  Family History: Family History  Problem Relation Age of Onset   Breast cancer Sister 56   Brain cancer Daughter    Breast cancer Maternal Aunt 75    Social History:   reports that she has never smoked. She has never been exposed to tobacco smoke. She has never used smokeless tobacco. She reports that she does not currently use alcohol. She reports that she does not use drugs.  Physical Exam: BP 129/65   Pulse 83   Ht 5'  4 (1.626 m)   Wt 122 lb (55.3 kg)   BMI 20.94 kg/m   Constitutional:  Alert and oriented, no acute distress, nontoxic appearing HEENT: , AT Cardiovascular: No clubbing, cyanosis, or edema Respiratory: Normal respiratory effort, no increased work of breathing Skin: No rashes, bruises or suspicious lesions Neurologic: Grossly intact, no focal deficits, moving all 4 extremities Psychiatric: Normal mood and affect  Laboratory Data: Results for orders placed or performed in visit on 07/05/23  Microscopic Examination   Collection Time: 07/05/23 10:58 AM   Urine  Result Value Ref Range   WBC, UA WILL FOLLOW    RBC, Urine WILL FOLLOW    Epithelial Cells (non renal) WILL FOLLOW    Renal Epithel, UA WILL FOLLOW    Casts WILL FOLLOW    Cast Type WILL FOLLOW    Crystals WILL FOLLOW    Crystal Type WILL FOLLOW    Mucus, UA WILL FOLLOW    Bacteria, UA WILL FOLLOW    Yeast, UA WILL FOLLOW    Trichomonas, UA WILL FOLLOW    Urinalysis Comments WILL FOLLOW   Urinalysis, Complete   Collection Time: 07/05/23 10:58 AM  Result Value Ref Range   Specific Gravity, UA 1.015 1.005 - 1.030   pH, UA 7.0 5.0 - 7.5   Color, UA Yellow Yellow   Appearance Ur Cloudy (A) Clear   Leukocytes,UA 3+ (A) Negative   Protein,UA 1+ (A) Negative/Trace   Glucose, UA Negative Negative   Ketones, UA Negative Negative   RBC, UA 1+ (A) Negative   Bilirubin, UA Negative Negative   Urobilinogen, Ur 1.0 0.2 - 1.0 mg/dL   Nitrite, UA Negative Negative   Microscopic Examination See below:   BLADDER SCAN AMB NON-IMAGING   Collection Time: 07/05/23  1:52 PM  Result Value Ref Range   Scan Result 21 ml   Assessment & Plan:   1. Acute cystitis with hematuria (Primary) UA appears grossly infected, will start empiric Macrobid and send for culture for further evaluation. - Urinalysis, Complete - CULTURE, URINE COMPREHENSIVE - nitrofurantoin, macrocrystal-monohydrate, (MACROBID) 100 MG capsule; Take 1 capsule (100 mg  total) by mouth 2 (two) times daily for 5 days.  Dispense: 10 capsule; Refill: 0  2. Mixed incontinence She is emptying appropriately, though she had to stop trospium  2 days ago due to bladder discomfort and constipation.  Unclear if bladder discomfort was related to #1 above.  Other antimuscarinics are contraindicated in the setting of her age.  Will start Myrbetriq 25 mg and see her back next month for symptom recheck. - BLADDER SCAN AMB NON-IMAGING - mirabegron ER (MYRBETRIQ) 25 MG TB24 tablet; Take 1 tablet (25 mg total) by mouth daily.  Dispense: 30 tablet; Refill: 0   Return in about 4 weeks (around 08/02/2023) for Symptom recheck with PVR.  Lanisha Stepanian, PA-C  [place]  [facility]rology [facility] [address], [address] [place], [place] [address] [phone]

## 2023-07-09 LAB — CULTURE, URINE COMPREHENSIVE

## 2023-07-27 ENCOUNTER — Other Ambulatory Visit: Payer: Self-pay | Admitting: Physician Assistant

## 2023-07-27 DIAGNOSIS — N3946 Mixed incontinence: Secondary | ICD-10-CM

## 2023-07-29 ENCOUNTER — Ambulatory Visit: Admitting: Physician Assistant

## 2023-08-09 ENCOUNTER — Ambulatory Visit: Admitting: Physician Assistant

## 2023-08-10 ENCOUNTER — Other Ambulatory Visit: Payer: Self-pay | Admitting: Physician Assistant

## 2023-08-10 DIAGNOSIS — N3946 Mixed incontinence: Secondary | ICD-10-CM

## 2023-08-16 ENCOUNTER — Telehealth: Payer: Self-pay

## 2023-08-16 NOTE — Telephone Encounter (Signed)
 LVM for pt to return call.   Pt contacted us  stating copay is too high and would like a different medication, she has tried only trospium  in the past.   Placed call to inform pt, Per Sam, Unfortunately, we do not have anything else to offer that would be safe or cheaper. Other medications in the same class as trospium  could contribute to cognitive decline and increase her risk for dementia. Please offer her an office visit to discuss third line therapies. Pt has a follow up with sam already scheduled 08/08

## 2023-08-20 ENCOUNTER — Ambulatory Visit: Admitting: Physician Assistant

## 2023-08-20 VITALS — BP 151/76 | HR 82 | Ht 64.0 in | Wt 121.2 lb

## 2023-08-20 DIAGNOSIS — L292 Pruritus vulvae: Secondary | ICD-10-CM | POA: Diagnosis not present

## 2023-08-20 DIAGNOSIS — R3915 Urgency of urination: Secondary | ICD-10-CM

## 2023-08-20 DIAGNOSIS — N3946 Mixed incontinence: Secondary | ICD-10-CM | POA: Diagnosis not present

## 2023-08-20 LAB — BLADDER SCAN AMB NON-IMAGING

## 2023-08-20 MED ORDER — FLUCONAZOLE 150 MG PO TABS: 150.0000 mg | ORAL_TABLET | Freq: Once | ORAL | 0 refills | Status: AC

## 2023-08-20 NOTE — Progress Notes (Signed)
 08/20/2023 10:26 AM   Amber Galloway 1940/03/29 [id number]  CC: Chief Complaint  Patient presents with   Follow-up   HPI: Amber Galloway is a 83 y.o. female with PMH bladder cancer up-to-date with surveillance cystoscopy and mixed urge and insensate incontinence who developed constipation on trospium  and who could not afford Myrbetriq  who presents today for follow-up.   Today she reports no form of vaginal itching/irritation.  She questions a yeast infection.  She still having bothersome urinary urgency.  She is scheduled for surveillance cystoscopy in November, but is hesitant to proceed with third line therapies for her OAB until this is repeated.  PVR 0 mL.  PMH: Past Medical History:  Diagnosis Date   Anemia    Anxiety    Arthritis    Bladder tumor    Breast cancer (HCC) 08/2012   rt mastectomy   GERD (gastroesophageal reflux disease)    Headache    h/o migraines   Hyperlipidemia    Hypertension    Melanoma (HCC)    Myocardial infarction (HCC) 1997   no stents   PONV (postoperative nausea and vomiting)    Pre-diabetes     Surgical History: Past Surgical History:  Procedure Laterality Date   APPENDECTOMY     CATARACT EXTRACTION W/PHACO Left 05/03/2023   Procedure: PHACOEMULSIFICATION, CATARACT, WITH IOL INSERTION 9.68 00:55.5;  Surgeon: Myrna Adine Anes, MD;  Location: [facility];  Service: Ophthalmology;  Laterality: Left;   CATARACT EXTRACTION W/PHACO Right 05/17/2023   Procedure: PHACOEMULSIFICATION, CATARACT, WITH IOL INSERTION 14.37, 01:12.8;  Surgeon: Myrna Adine Anes, MD;  Location: [facility];  Service: Ophthalmology;  Laterality: Right;   CHOLECYSTECTOMY     COLONOSCOPY  2014   cleared   COLONOSCOPY WITH PROPOFOL  N/A 07/03/2019   Procedure: COLONOSCOPY WITH PROPOFOL ;  Surgeon: Toledo, Ladell POUR, MD;  Location: [facility];  Service: Gastroenterology;  Laterality: N/A;   CYSTOSCOPY WITH BIOPSY N/A 11/29/2020   Procedure:  CYSTOSCOPY WITH BLADDER BIOPSY;  Surgeon: Francisca Redell BROCKS, MD;  Location: [facility];  Service: Urology;  Laterality: N/A;   MASTECTOMY Right 08/2012   TRANSURETHRAL RESECTION OF BLADDER TUMOR WITH MITOMYCIN -C N/A 08/02/2020   Procedure: TRANSURETHRAL RESECTION OF BLADDER TUMOR WITH GEMCITABINE ;  Surgeon: Francisca Redell BROCKS, MD;  Location: [facility];  Service: Urology;  Laterality: N/A;   VAGINAL HYSTERECTOMY      Home Medications:  Allergies as of 08/20/2023   No Known Allergies      Medication List        Accurate as of August 20, 2023 10:26 AM. If you have any questions, ask your nurse or doctor.          STOP taking these medications    Myrbetriq  25 MG Tb24 tablet Generic drug: mirabegron  ER       TAKE these medications    acetaminophen  325 MG tablet Commonly known as: TYLENOL  Take 325-650 mg by mouth every 6 (six) hours as needed (pain.).   aspirin EC 81 MG tablet Take 81 mg by mouth in the morning.   hydrochlorothiazide  12.5 MG capsule Commonly known as: MICROZIDE  TAKE 1 CAPSULE BY MOUTH EVERY DAY   latanoprost 0.005 % ophthalmic solution Commonly known as: XALATAN Place 1 drop into both eyes daily.   metoprolol  tartrate 50 MG tablet Commonly known as: LOPRESSOR  Take 1 tablet (50 mg total) by mouth 2 (two) times daily.   omeprazole  20 MG capsule Commonly known as: PRILOSEC Take 1 capsule (20 mg total) by  mouth daily.        Allergies:  No Known Allergies  Family History: Family History  Problem Relation Age of Onset   Breast cancer Sister 71   Brain cancer Daughter    Breast cancer Maternal Aunt 58    Social History:   reports that she has never smoked. She has never been exposed to tobacco smoke. She has never used smokeless tobacco. She reports that she does not currently use alcohol. She reports that she does not use drugs.  Physical Exam: BP (!) 151/76 (BP Location: Left Arm, Patient Position: Sitting, Cuff Size: Normal)   Pulse 82   Ht 5'  4 (1.626 m)   Wt 121 lb 3.2 oz (55 kg)   SpO2 98%   BMI 20.80 kg/m   Constitutional:  Alert and oriented, no acute distress, nontoxic appearing HEENT: [place], AT Cardiovascular: No clubbing, cyanosis, or edema Respiratory: Normal respiratory effort, no increased work of breathing Skin: No rashes, bruises or suspicious lesions Neurologic: Grossly intact, no focal deficits, moving all 4 extremities Psychiatric: Normal mood and affect  Laboratory Data: Results for orders placed or performed in visit on 08/20/23  Bladder Scan (Post Void Residual) in office   Collection Time: 08/20/23 10:19 AM  Result Value Ref Range   SCA Result 0ml    Assessment & Plan:   1. 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  We discussed third line therapies including PTNS, intravesical Botox, and InterStim.  Of these, I suspect she is most interested in PTNS, however she would like to rule out bladder cancer recurrence before pursuing any of these.  Will move up her scheduled surveillance cystoscopy with Dr. Francisca for reassurance purposes. - Bladder Scan (Post Void Residual) in office  2. Vulvovaginal itching Will treat her empirically with Diflucan  x 1 dose for possible vulvovaginal candidiasis. - fluconazole  (DIFLUCAN ) 150 MG tablet; Take 1 tablet (150 mg total) by mouth once for 1 dose.  Dispense: 1 tablet; Refill: 0   Return for Move up cysto with Dr. Francisca.  Amber Hones, PA-C  [facility] [place] [address] [place], [place] [address] [phone]

## 2023-09-29 ENCOUNTER — Encounter: Payer: Self-pay | Admitting: Urology

## 2023-09-29 ENCOUNTER — Ambulatory Visit: Admitting: Urology

## 2023-09-29 VITALS — BP 125/68 | HR 72 | Ht 64.0 in | Wt 122.8 lb

## 2023-09-29 DIAGNOSIS — Z8551 Personal history of malignant neoplasm of bladder: Secondary | ICD-10-CM | POA: Diagnosis not present

## 2023-09-29 DIAGNOSIS — Z792 Long term (current) use of antibiotics: Secondary | ICD-10-CM

## 2023-09-29 MED ORDER — LIDOCAINE HCL URETHRAL/MUCOSAL 2 % EX GEL
1.0000 | Freq: Once | CUTANEOUS | Status: AC
  Administered 2023-09-29: 1 via URETHRAL

## 2023-09-29 MED ORDER — CEPHALEXIN 250 MG PO CAPS
500.0000 mg | ORAL_CAPSULE | Freq: Once | ORAL | Status: AC
  Administered 2023-09-29: 500 mg via ORAL

## 2023-09-29 NOTE — Progress Notes (Signed)
 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  Path HG Ta  -Recurrence 11/2020: Multiple small papillary tumors biopsied and fulgurated, HG Ta  -Induction BCG completed 02/2021 -She deferred mBCG   Keflex  given for prophylaxis  After informed consent and discussion of the procedure and its risks, Amber Galloway was positioned and prepped in the standard fashion. Cystoscopy was performed with a flexible cystoscope. The urethra, bladder neck and entire bladder was visualized in a standard fashion.  Bladder mucosa grossly normal throughout, no abnormalities on retroflexion.   Findings: Normal cystoscopy   Assessment and Plan: No evidence of recurrence  Denies any OAB or urinary symptoms today  RTC 6 month cysto, can space to yearly starting 11/2024  Redell Burnet, MD 09/29/2023

## 2023-12-01 ENCOUNTER — Other Ambulatory Visit: Admitting: Urology

## 2023-12-02 ENCOUNTER — Other Ambulatory Visit: Admitting: Urology

## 2024-01-27 ENCOUNTER — Ambulatory Visit
Admission: RE | Admit: 2024-01-27 | Discharge: 2024-01-27 | Disposition: A | Discharge status: Home / Self Care | Attending: Cardiology | Admitting: Cardiology

## 2024-01-27 ENCOUNTER — Encounter: Admission: RE | Disposition: A | Payer: Self-pay | Source: Home / Self Care | Attending: Cardiology

## 2024-01-27 ENCOUNTER — Ambulatory Visit: Admitting: Certified Registered"

## 2024-01-27 ENCOUNTER — Other Ambulatory Visit: Payer: Self-pay

## 2024-01-27 DIAGNOSIS — Z8249 Family history of ischemic heart disease and other diseases of the circulatory system: Secondary | ICD-10-CM | POA: Diagnosis not present

## 2024-01-27 DIAGNOSIS — I4891 Unspecified atrial fibrillation: Secondary | ICD-10-CM | POA: Diagnosis present

## 2024-01-27 DIAGNOSIS — I4819 Other persistent atrial fibrillation: Secondary | ICD-10-CM

## 2024-01-27 DIAGNOSIS — Z79899 Other long term (current) drug therapy: Secondary | ICD-10-CM | POA: Diagnosis not present

## 2024-01-27 DIAGNOSIS — I10 Essential (primary) hypertension: Secondary | ICD-10-CM | POA: Insufficient documentation

## 2024-01-27 HISTORY — PX: CARDIOVERSION: SHX1299

## 2024-01-27 MED ORDER — PHENYLEPHRINE HCL-NACL 20-0.9 MG/250ML-% IV SOLN
INTRAVENOUS | Status: AC
  Filled 2024-01-27: qty 250

## 2024-01-27 MED ORDER — SODIUM CHLORIDE 0.9 % IV SOLN: INTRAVENOUS | Status: DC

## 2024-01-27 MED ORDER — PROPOFOL 10 MG/ML IV BOLUS
INTRAVENOUS | Status: DC | PRN
  Administered 2024-01-27: 70 mg via INTRAVENOUS

## 2024-01-27 MED ORDER — PROPOFOL 10 MG/ML IV BOLUS
INTRAVENOUS | Status: AC
  Filled 2024-01-27: qty 20

## 2024-01-27 NOTE — Anesthesia Preprocedure Evaluation (Signed)
"                                    Anesthesia Evaluation  Patient identified by MRN, date of birth, ID band Patient awake    Reviewed: Allergy & Precautions, H&P , NPO status , Patient's Chart, lab work & pertinent test results  Airway Mallampati: II  TM Distance: >3 FB Neck ROM: Full    Dental no notable dental hx.    Pulmonary neg pulmonary ROS   Pulmonary exam normal breath sounds clear to auscultation       Cardiovascular hypertension, negative cardio ROS Normal cardiovascular exam Rhythm:Regular Rate:Normal     Neuro/Psych negative neurological ROS  negative psych ROS   GI/Hepatic negative GI ROS, Neg liver ROS,,,  Endo/Other  negative endocrine ROS    Renal/GU negative Renal ROS  negative genitourinary   Musculoskeletal negative musculoskeletal ROS (+)    Abdominal   Peds negative pediatric ROS (+)  Hematology negative hematology ROS (+)   Anesthesia Other Findings   Reproductive/Obstetrics negative OB ROS                              Anesthesia Physical Anesthesia Plan  ASA: 3  Anesthesia Plan: MAC   Post-op Pain Management:    Induction: Intravenous  PONV Risk Score and Plan:   Airway Management Planned:   Additional Equipment:   Intra-op Plan:   Post-operative Plan: Extubation in OR  Informed Consent: I have reviewed the patients History and Physical, chart, labs and discussed the procedure including the risks, benefits and alternatives for the proposed anesthesia with the patient or authorized representative who has indicated his/her understanding and acceptance.     Dental advisory given  Plan Discussed with: CRNA  Anesthesia Plan Comments:         Anesthesia Quick Evaluation  "

## 2024-01-27 NOTE — Procedures (Signed)
 Electrical Cardioversion Procedure Note  Indication: Atrial fibrillation  Procedure Details: Consent: Indication, Risk/benefits of procedure as well as the alternatives explained to patient and informed consent obtained. Time out performed. Verified patient identification, verified procedure, verified correct patient position, special equipment/implants available, medications/allergies/relevent history reviewed, required imaging and test results reviewed.  Deep sedation was provided by anesthesia with propofol. Patient was delivered with 200 Joules of electricity X 1 with success to Sinus rhythm. Patient tolerated the procedure well. No immediate complication noted.   Successful cardioversion  Windell Norfolk, MD [facility] Card[facility]- [facility]

## 2024-01-27 NOTE — Anesthesia Postprocedure Evaluation (Signed)
"   Anesthesia Post Note  Patient: Amber Galloway  Procedure(s) Performed: CARDIOVERSION  Patient location during evaluation: Specials Recovery Anesthesia Type: MAC Level of consciousness: awake and alert Pain management: pain level controlled Vital Signs Assessment: post-procedure vital signs reviewed and stable Respiratory status: spontaneous breathing, nonlabored ventilation, respiratory function stable and patient connected to nasal cannula oxygen Cardiovascular status: blood pressure returned to baseline and stable Postop Assessment: no apparent nausea or vomiting Anesthetic complications: no   No notable events documented.   Last Vitals:  Vitals:   01/27/24 1249 01/27/24 1251  BP:  (!) 143/73  Pulse: 92 86  Resp: (!) 27 11  Temp:  (!) 36.3 C  SpO2: 94% 99%    Last Pain:  Vitals:   01/27/24 1251  TempSrc: Temporal  PainSc: 0-No pain                 Fairy A Dijon Kohlman      "

## 2024-01-27 NOTE — Transfer of Care (Signed)
 Immediate Anesthesia Transfer of Care Note  Patient: Amber Galloway  Procedure(s) Performed: CARDIOVERSION  Patient Location: Cath Lab  Anesthesia Type:MAC  Level of Consciousness: awake, alert , and drowsy  Airway & Oxygen Therapy: Patient Spontanous Breathing and Patient connected to nasal cannula oxygen  Post-op Assessment: Report given to RN and Post -op Vital signs reviewed and stable  Post vital signs: Reviewed and stable  Last Vitals:  Vitals Value Taken Time  BP 146/72 01/27/24 12:45  Temp 35.8 1245  Pulse 92 01/27/24 12:49  Resp 27 01/27/24 12:49  SpO2 94 % 01/27/24 12:49    Last Pain:  Vitals:   01/27/24 1149  TempSrc: Temporal  PainSc: 0-No pain         Complications: No notable events documented.

## 2024-01-28 ENCOUNTER — Encounter: Payer: Self-pay | Admitting: Cardiology

## 2024-03-22 ENCOUNTER — Other Ambulatory Visit: Admitting: Urology
